# Patient Record
Sex: Female | Born: 1975 | Race: White | Hispanic: No | Smoking: Never smoker
Health system: Southern US, Community
[De-identification: ages and names within clinical notes are randomized; demographics above are authoritative.]

## PROBLEM LIST (undated history)

## (undated) DIAGNOSIS — B019 Varicella without complication: Secondary | ICD-10-CM

## (undated) DIAGNOSIS — F32A Depression, unspecified: Secondary | ICD-10-CM

## (undated) DIAGNOSIS — R51 Headache: Secondary | ICD-10-CM

## (undated) DIAGNOSIS — R519 Headache, unspecified: Secondary | ICD-10-CM

## (undated) DIAGNOSIS — F329 Major depressive disorder, single episode, unspecified: Secondary | ICD-10-CM

## (undated) DIAGNOSIS — G8929 Other chronic pain: Secondary | ICD-10-CM

## (undated) DIAGNOSIS — G4730 Sleep apnea, unspecified: Secondary | ICD-10-CM

## (undated) DIAGNOSIS — M1990 Unspecified osteoarthritis, unspecified site: Secondary | ICD-10-CM

## (undated) DIAGNOSIS — F419 Anxiety disorder, unspecified: Secondary | ICD-10-CM

## (undated) DIAGNOSIS — R351 Nocturia: Secondary | ICD-10-CM

## (undated) DIAGNOSIS — N390 Urinary tract infection, site not specified: Secondary | ICD-10-CM

## (undated) DIAGNOSIS — E079 Disorder of thyroid, unspecified: Secondary | ICD-10-CM

## (undated) DIAGNOSIS — R011 Cardiac murmur, unspecified: Secondary | ICD-10-CM

## (undated) DIAGNOSIS — M542 Cervicalgia: Secondary | ICD-10-CM

## (undated) DIAGNOSIS — F988 Other specified behavioral and emotional disorders with onset usually occurring in childhood and adolescence: Secondary | ICD-10-CM

## (undated) DIAGNOSIS — G4700 Insomnia, unspecified: Secondary | ICD-10-CM

## (undated) HISTORY — DX: Urinary tract infection, site not specified: N39.0

## (undated) HISTORY — PX: OTHER SURGICAL HISTORY: SHX169

## (undated) HISTORY — DX: Disorder of thyroid, unspecified: E07.9

## (undated) HISTORY — DX: Varicella without complication: B01.9

## (undated) HISTORY — DX: Anxiety disorder, unspecified: F41.9

---

## 1997-10-14 ENCOUNTER — Other Ambulatory Visit: Admission: RE | Admit: 1997-10-14 | Discharge: 1997-10-14 | Payer: Self-pay | Admitting: Obstetrics and Gynecology

## 1998-10-26 ENCOUNTER — Other Ambulatory Visit: Admission: RE | Admit: 1998-10-26 | Discharge: 1998-10-26 | Payer: Self-pay | Admitting: Obstetrics and Gynecology

## 2000-12-12 ENCOUNTER — Other Ambulatory Visit: Admission: RE | Admit: 2000-12-12 | Discharge: 2000-12-12 | Payer: Self-pay | Admitting: Obstetrics and Gynecology

## 2001-12-18 ENCOUNTER — Other Ambulatory Visit: Admission: RE | Admit: 2001-12-18 | Discharge: 2001-12-18 | Payer: Self-pay | Admitting: Obstetrics and Gynecology

## 2002-10-22 ENCOUNTER — Encounter: Payer: Self-pay | Admitting: Obstetrics and Gynecology

## 2002-10-22 ENCOUNTER — Ambulatory Visit (HOSPITAL_COMMUNITY): Admission: RE | Admit: 2002-10-22 | Discharge: 2002-10-22 | Payer: Self-pay | Admitting: Obstetrics and Gynecology

## 2002-10-29 ENCOUNTER — Encounter: Payer: Self-pay | Admitting: Obstetrics and Gynecology

## 2002-10-29 ENCOUNTER — Ambulatory Visit (HOSPITAL_COMMUNITY): Admission: RE | Admit: 2002-10-29 | Discharge: 2002-10-29 | Payer: Self-pay | Admitting: Obstetrics and Gynecology

## 2003-01-01 ENCOUNTER — Inpatient Hospital Stay (HOSPITAL_COMMUNITY): Admission: AD | Admit: 2003-01-01 | Discharge: 2003-01-01 | Payer: Self-pay | Admitting: Obstetrics and Gynecology

## 2013-07-03 LAB — HM PAP SMEAR: HM Pap smear: NORMAL

## 2014-05-21 ENCOUNTER — Other Ambulatory Visit (HOSPITAL_COMMUNITY): Payer: Self-pay | Admitting: Orthopaedic Surgery

## 2014-05-27 ENCOUNTER — Encounter (HOSPITAL_COMMUNITY): Payer: Self-pay

## 2014-05-27 ENCOUNTER — Encounter (HOSPITAL_COMMUNITY)
Admission: RE | Admit: 2014-05-27 | Discharge: 2014-05-27 | Disposition: A | Discharge status: Home / Self Care | Payer: Managed Care, Other (non HMO) | Source: Ambulatory Visit | Attending: Orthopaedic Surgery | Admitting: Orthopaedic Surgery

## 2014-05-27 DIAGNOSIS — Z01812 Encounter for preprocedural laboratory examination: Secondary | ICD-10-CM | POA: Diagnosis present

## 2014-05-27 HISTORY — DX: Cervicalgia: M54.2

## 2014-05-27 HISTORY — DX: Other chronic pain: G89.29

## 2014-05-27 HISTORY — DX: Depression, unspecified: F32.A

## 2014-05-27 HISTORY — DX: Other specified behavioral and emotional disorders with onset usually occurring in childhood and adolescence: F98.8

## 2014-05-27 HISTORY — DX: Cardiac murmur, unspecified: R01.1

## 2014-05-27 HISTORY — DX: Sleep apnea, unspecified: G47.30

## 2014-05-27 HISTORY — DX: Nocturia: R35.1

## 2014-05-27 HISTORY — DX: Major depressive disorder, single episode, unspecified: F32.9

## 2014-05-27 HISTORY — DX: Insomnia, unspecified: G47.00

## 2014-05-27 HISTORY — DX: Headache, unspecified: R51.9

## 2014-05-27 HISTORY — DX: Unspecified osteoarthritis, unspecified site: M19.90

## 2014-05-27 HISTORY — DX: Headache: R51

## 2014-05-27 LAB — CBC
HCT: 43.8 % (ref 36.0–46.0)
HEMOGLOBIN: 15.2 g/dL — AB (ref 12.0–15.0)
MCH: 30.5 pg (ref 26.0–34.0)
MCHC: 34.7 g/dL (ref 30.0–36.0)
MCV: 88 fL (ref 78.0–100.0)
Platelets: 324 10*3/uL (ref 150–400)
RBC: 4.98 MIL/uL (ref 3.87–5.11)
RDW: 13.1 % (ref 11.5–15.5)
WBC: 8.2 10*3/uL (ref 4.0–10.5)

## 2014-05-27 LAB — COMPREHENSIVE METABOLIC PANEL
ALT: 19 U/L (ref 0–35)
AST: 16 U/L (ref 0–37)
Albumin: 3.6 g/dL (ref 3.5–5.2)
Alkaline Phosphatase: 92 U/L (ref 39–117)
Anion gap: 12 (ref 5–15)
BILIRUBIN TOTAL: 0.8 mg/dL (ref 0.3–1.2)
BUN: 11 mg/dL (ref 6–23)
CALCIUM: 8.7 mg/dL (ref 8.4–10.5)
CO2: 23 meq/L (ref 19–32)
CREATININE: 0.65 mg/dL (ref 0.50–1.10)
Chloride: 104 mEq/L (ref 96–112)
GFR calc non Af Amer: >90 mL/min (ref >90)
Glucose, Bld: 121 mg/dL — ABNORMAL HIGH (ref 70–99)
Potassium: 4.1 mEq/L (ref 3.7–5.3)
Sodium: 139 mEq/L (ref 137–147)
Total Protein: 7.1 g/dL (ref 6.0–8.3)

## 2014-05-27 LAB — HCG, SERUM, QUALITATIVE: Preg, Serum: NEGATIVE

## 2014-05-27 LAB — SURGICAL PCR SCREEN
MRSA, PCR: NEGATIVE
Staphylococcus aureus: NEGATIVE

## 2014-05-27 MED ORDER — CHLORHEXIDINE GLUCONATE 4 % EX LIQD: 60.0000 mL | Freq: Once | CUTANEOUS | Status: DC

## 2014-05-27 NOTE — Progress Notes (Signed)
Pt doesn't have a cardiologist  Denies ever having an echo/stress test/heart cath  Denies EKG or CXR in past yr   Medical MD is Dr.Mishi Jean RosenthalJackson in [place]

## 2014-05-27 NOTE — Pre-Procedure Instructions (Signed)
[name] Mayotte[facility]athryn [name]  05/27/2014   Your procedure is scheduled on:  Mon, Dec 21 @ 1:10 PM  [facility]  at 11:00 AM.  Call this number if you have problems the morning of surgery: [phone]   Remember:   Do not eat food or drink liquids after midnight.   Take these medicines the morning of surgery with A SIP OF WATER: Methyslphenidate(Concerta)              Stop taking your Aleve. No Goody's,BC's,Aspirin,Ibuprofen,Fish Oil,or any Herbal Medications   Do not wear jewelry, make-up or nail polish.  Do not wear lotions, powders, or perfumes. You may wear deodorant.  Do not shave 48 hours prior to surgery.   Do not bring valuables to the hospital.  [facility] is not responsible                  for any belongings or valuables.               Contacts, dentures or bridgework may not be worn into surgery.  Leave suitcase in the car. After surgery it may be brought to your room.  For patients admitted to the hospital, discharge time is determined by your                treatment team.               Patients discharged the day of surgery will not be allowed to drive  home.    Special Instructions:  [facility] - Preparing for Surgery  Before surgery, you can play an important role.  Because skin is not sterile, your skin needs to be as free of germs as possible.  You can reduce the number of germs on you skin by washing with CHG (chlorahexidine gluconate) soap before surgery.  CHG is an antiseptic cleaner which kills germs and bonds with the skin to continue killing germs even after washing.  Please DO NOT use if you have an allergy to CHG or antibacterial soaps.  If your skin becomes reddened/irritated stop using the CHG and inform your nurse when you arrive at Short Stay.  Do not shave (including legs and underarms) for at least 48 hours prior to the first CHG shower.  You may shave your face.  Please follow these instructions carefully:   1.  Shower with CHG  Soap the night before surgery and the                                morning of Surgery.  2.  If you choose to wash your hair, wash your hair first as usual with your       normal shampoo.  3.  After you shampoo, rinse your hair and body thoroughly to remove the                      Shampoo.  4.  Use CHG as you would any other liquid soap.  You can apply chg directly       to the skin and wash gently with scrungie or a clean washcloth.  5.  Apply the CHG Soap to your body ONLY FROM THE NECK DOWN.        Do not use on open wounds or open sores.  Avoid contact with your eyes,       ears, mouth and genitals (  private parts).  Wash genitals (private parts)       with your normal soap.  6.  Wash thoroughly, paying special attention to the area where your surgery        will be performed.  7.  Thoroughly rinse your body with warm water from the neck down.  8.  DO NOT shower/wash with your normal soap after using and rinsing off       the CHG Soap.  9.  Pat yourself dry with a clean towel.            10.  Wear clean pajamas.            11.  Place clean sheets on your bed the night of your first shower and do not        sleep with pets.  Day of Surgery  Do not apply any lotions/deoderants the morning of surgery.  Please wear clean clothes to the hospital/surgery center.     Please read over the following fact sheets that you were given: Pain Booklet, Coughing and Deep Breathing, MRSA Information and Surgical Site Infection Prevention

## 2014-06-01 MED ORDER — CEFAZOLIN SODIUM-DEXTROSE 2-3 GM-% IV SOLR
2.0000 g | INTRAVENOUS | Status: AC
  Administered 2014-06-02: 2 g via INTRAVENOUS
  Filled 2014-06-01: qty 50

## 2014-06-01 NOTE — Anesthesia Preprocedure Evaluation (Addendum)
Anesthesia Evaluation  Patient identified by MRN, date of birth, ID band Patient awake    Reviewed: Allergy & Precautions, H&P , NPO status , Patient's Chart, lab work & pertinent test results, reviewed documented beta blocker date and time   Airway        Dental   Pulmonary sleep apnea ,          Cardiovascular     Neuro/Psych Depression    GI/Hepatic negative GI ROS, Neg liver ROS,   Endo/Other  negative endocrine ROS  Renal/GU negative Renal ROS     Musculoskeletal   Abdominal   Peds  Hematology negative hematology ROS (+)   Anesthesia Other Findings   Reproductive/Obstetrics                             Anesthesia Physical Anesthesia Plan  ASA: III  Anesthesia Plan: General   Post-op Pain Management:    Induction: Intravenous  Airway Management Planned: Oral ETT  Additional Equipment:   Intra-op Plan:   Post-operative Plan: Extubation in OR  Informed Consent: I have reviewed the patients History and Physical, chart, labs and discussed the procedure including the risks, benefits and alternatives for the proposed anesthesia with the patient or authorized representative who has indicated his/her understanding and acceptance.     Plan Discussed with:   Anesthesia Plan Comments: (Consider multinodal  pain rx)       Anesthesia Quick Evaluation

## 2014-06-02 ENCOUNTER — Ambulatory Visit (HOSPITAL_COMMUNITY): Payer: Managed Care, Other (non HMO) | Admitting: Anesthesiology

## 2014-06-02 ENCOUNTER — Observation Stay (HOSPITAL_COMMUNITY): Payer: Managed Care, Other (non HMO)

## 2014-06-02 ENCOUNTER — Encounter (HOSPITAL_COMMUNITY)
Admission: RE | Disposition: A | Discharge status: Home / Self Care | Payer: Self-pay | Source: Ambulatory Visit | Attending: Orthopaedic Surgery

## 2014-06-02 ENCOUNTER — Observation Stay (HOSPITAL_COMMUNITY)
Admission: RE | Admit: 2014-06-02 | Discharge: 2014-06-03 | Disposition: A | Discharge status: Home / Self Care | Payer: Managed Care, Other (non HMO) | Source: Ambulatory Visit | Attending: Orthopaedic Surgery | Admitting: Orthopaedic Surgery

## 2014-06-02 ENCOUNTER — Encounter (HOSPITAL_COMMUNITY): Payer: Self-pay | Admitting: *Deleted

## 2014-06-02 DIAGNOSIS — M5022 Other cervical disc displacement, mid-cervical region: Principal | ICD-10-CM | POA: Insufficient documentation

## 2014-06-02 DIAGNOSIS — Z419 Encounter for procedure for purposes other than remedying health state, unspecified: Secondary | ICD-10-CM

## 2014-06-02 DIAGNOSIS — Z981 Arthrodesis status: Secondary | ICD-10-CM

## 2014-06-02 HISTORY — PX: ANTERIOR CERVICAL DECOMP/DISCECTOMY FUSION: SHX1161

## 2014-06-02 LAB — URINALYSIS, ROUTINE W REFLEX MICROSCOPIC
BILIRUBIN URINE: NEGATIVE
Glucose, UA: NEGATIVE mg/dL
HGB URINE DIPSTICK: NEGATIVE
KETONES UR: 15 mg/dL — AB
Leukocytes, UA: NEGATIVE
Nitrite: NEGATIVE
PROTEIN: NEGATIVE mg/dL
Specific Gravity, Urine: 1.022 (ref 1.005–1.030)
UROBILINOGEN UA: 0.2 mg/dL (ref 0.0–1.0)
pH: 6 (ref 5.0–8.0)

## 2014-06-02 SURGERY — ANTERIOR CERVICAL DECOMPRESSION/DISCECTOMY FUSION 1 LEVEL
Anesthesia: General | Site: Neck

## 2014-06-02 MED ORDER — LACTATED RINGERS IV SOLN
INTRAVENOUS | Status: DC
  Administered 2014-06-02: 14:00:00 via INTRAVENOUS

## 2014-06-02 MED ORDER — HYDROMORPHONE HCL 1 MG/ML IJ SOLN
0.5000 mg | INTRAMUSCULAR | Status: DC | PRN
  Administered 2014-06-03: 1 mg via INTRAVENOUS
  Filled 2014-06-02: qty 1

## 2014-06-02 MED ORDER — EPHEDRINE SULFATE 50 MG/ML IJ SOLN
INTRAMUSCULAR | Status: DC | PRN
  Administered 2014-06-02: 5 mg via INTRAVENOUS

## 2014-06-02 MED ORDER — METOCLOPRAMIDE HCL 10 MG PO TABS: 5.0000 mg | ORAL_TABLET | Freq: Three times a day (TID) | ORAL | Status: DC | PRN

## 2014-06-02 MED ORDER — METHOCARBAMOL 500 MG PO TABS
500.0000 mg | ORAL_TABLET | Freq: Four times a day (QID) | ORAL | Status: DC | PRN
  Administered 2014-06-02 – 2014-06-03 (×2): 500 mg via ORAL
  Filled 2014-06-02 (×2): qty 1

## 2014-06-02 MED ORDER — THROMBIN 20000 UNITS EX SOLR
OROMUCOSAL | Status: DC | PRN
  Administered 2014-06-02: 20000 mL via TOPICAL

## 2014-06-02 MED ORDER — LACTATED RINGERS IV SOLN
INTRAVENOUS | Status: DC | PRN
  Administered 2014-06-02: 15:00:00 via INTRAVENOUS

## 2014-06-02 MED ORDER — METOCLOPRAMIDE HCL 5 MG/ML IJ SOLN: 5.0000 mg | Freq: Three times a day (TID) | INTRAMUSCULAR | Status: DC | PRN

## 2014-06-02 MED ORDER — PROPOFOL 10 MG/ML IV BOLUS
INTRAVENOUS | Status: AC
  Filled 2014-06-02: qty 20

## 2014-06-02 MED ORDER — VANCOMYCIN HCL IN DEXTROSE 1-5 GM/200ML-% IV SOLN
1000.0000 mg | Freq: Two times a day (BID) | INTRAVENOUS | Status: AC
  Administered 2014-06-03: 1000 mg via INTRAVENOUS
  Filled 2014-06-02: qty 200

## 2014-06-02 MED ORDER — ONDANSETRON HCL 4 MG PO TABS: 4.0000 mg | ORAL_TABLET | Freq: Four times a day (QID) | ORAL | Status: DC | PRN

## 2014-06-02 MED ORDER — MIDAZOLAM HCL 2 MG/2ML IJ SOLN
INTRAMUSCULAR | Status: AC
  Filled 2014-06-02: qty 2

## 2014-06-02 MED ORDER — ONDANSETRON HCL 4 MG/2ML IJ SOLN
INTRAMUSCULAR | Status: DC | PRN
Start: 2014-06-02 — End: 2014-06-02
  Administered 2014-06-02: 4 mg via INTRAVENOUS

## 2014-06-02 MED ORDER — PROMETHAZINE HCL 25 MG/ML IJ SOLN
INTRAMUSCULAR | Status: AC
  Filled 2014-06-02: qty 1

## 2014-06-02 MED ORDER — METHOCARBAMOL 500 MG PO TABS: 500.0000 mg | ORAL_TABLET | Freq: Four times a day (QID) | ORAL | Status: DC

## 2014-06-02 MED ORDER — NEOSTIGMINE METHYLSULFATE 10 MG/10ML IV SOLN
INTRAVENOUS | Status: DC | PRN
  Administered 2014-06-02: 5 mg via INTRAVENOUS

## 2014-06-02 MED ORDER — FENTANYL CITRATE 0.05 MG/ML IJ SOLN
INTRAMUSCULAR | Status: AC
  Filled 2014-06-02: qty 5

## 2014-06-02 MED ORDER — BUPIVACAINE-EPINEPHRINE 0.5% -1:200000 IJ SOLN
INTRAMUSCULAR | Status: DC | PRN
  Administered 2014-06-02: 6 mL

## 2014-06-02 MED ORDER — FENTANYL CITRATE 0.05 MG/ML IJ SOLN
INTRAMUSCULAR | Status: DC | PRN
  Administered 2014-06-02 (×5): 50 ug via INTRAVENOUS

## 2014-06-02 MED ORDER — OXYCODONE-ACETAMINOPHEN 5-325 MG PO TABS
1.0000 | ORAL_TABLET | Freq: Four times a day (QID) | ORAL | Status: DC | PRN
  Administered 2014-06-02 – 2014-06-03 (×2): 1 via ORAL
  Filled 2014-06-02 (×2): qty 1

## 2014-06-02 MED ORDER — POTASSIUM CHLORIDE IN NACL 20-0.45 MEQ/L-% IV SOLN
INTRAVENOUS | Status: DC
  Administered 2014-06-03: 01:00:00 via INTRAVENOUS
  Filled 2014-06-02 (×3): qty 1000

## 2014-06-02 MED ORDER — GLYCOPYRROLATE 0.2 MG/ML IJ SOLN
INTRAMUSCULAR | Status: DC | PRN
  Administered 2014-06-02: .8 mg via INTRAVENOUS

## 2014-06-02 MED ORDER — ONDANSETRON HCL 4 MG/2ML IJ SOLN
INTRAMUSCULAR | Status: AC
  Filled 2014-06-02: qty 2

## 2014-06-02 MED ORDER — HYDROMORPHONE HCL 1 MG/ML IJ SOLN
INTRAMUSCULAR | Status: AC
  Filled 2014-06-02: qty 1

## 2014-06-02 MED ORDER — THROMBIN 20000 UNITS EX SOLR
CUTANEOUS | Status: AC
  Filled 2014-06-02: qty 20000

## 2014-06-02 MED ORDER — 0.9 % SODIUM CHLORIDE (POUR BTL) OPTIME
TOPICAL | Status: DC | PRN
  Administered 2014-06-02: 1000 mL

## 2014-06-02 MED ORDER — PROMETHAZINE HCL 25 MG/ML IJ SOLN
6.2500 mg | INTRAMUSCULAR | Status: DC | PRN
  Administered 2014-06-02: 6.25 mg via INTRAVENOUS

## 2014-06-02 MED ORDER — LIDOCAINE HCL (CARDIAC) 20 MG/ML IV SOLN
INTRAVENOUS | Status: AC
  Filled 2014-06-02: qty 5

## 2014-06-02 MED ORDER — METHOCARBAMOL 1000 MG/10ML IJ SOLN
500.0000 mg | Freq: Four times a day (QID) | INTRAVENOUS | Status: DC | PRN
  Filled 2014-06-02: qty 5

## 2014-06-02 MED ORDER — OXYCODONE HCL 5 MG/5ML PO SOLN: 5.0000 mg | Freq: Once | ORAL | Status: DC | PRN

## 2014-06-02 MED ORDER — OXYCODONE-ACETAMINOPHEN 10-325 MG PO TABS: 1.0000 | ORAL_TABLET | Freq: Four times a day (QID) | ORAL | Status: DC | PRN

## 2014-06-02 MED ORDER — OXYCODONE HCL 5 MG PO TABS: 5.0000 mg | ORAL_TABLET | Freq: Four times a day (QID) | ORAL | Status: DC | PRN

## 2014-06-02 MED ORDER — OXYCODONE HCL 5 MG PO TABS: 5.0000 mg | ORAL_TABLET | Freq: Once | ORAL | Status: DC | PRN

## 2014-06-02 MED ORDER — ROCURONIUM BROMIDE 100 MG/10ML IV SOLN
INTRAVENOUS | Status: DC | PRN
  Administered 2014-06-02: 50 mg via INTRAVENOUS

## 2014-06-02 MED ORDER — ONDANSETRON HCL 4 MG/2ML IJ SOLN: 4.0000 mg | Freq: Four times a day (QID) | INTRAMUSCULAR | Status: DC | PRN

## 2014-06-02 MED ORDER — LIDOCAINE HCL (CARDIAC) 20 MG/ML IV SOLN
INTRAVENOUS | Status: DC | PRN
  Administered 2014-06-02: 100 mg via INTRAVENOUS

## 2014-06-02 MED ORDER — MIDAZOLAM HCL 5 MG/5ML IJ SOLN
INTRAMUSCULAR | Status: DC | PRN
  Administered 2014-06-02: 2 mg via INTRAVENOUS

## 2014-06-02 MED ORDER — METHYLPHENIDATE HCL ER 18 MG PO TB24: 36.0000 mg | ORAL_TABLET | Freq: Every day | ORAL | Status: DC

## 2014-06-02 MED ORDER — HYDROMORPHONE HCL 1 MG/ML IJ SOLN
0.2500 mg | INTRAMUSCULAR | Status: DC | PRN
  Administered 2014-06-02 (×2): 0.5 mg via INTRAVENOUS

## 2014-06-02 MED ORDER — PROPOFOL 10 MG/ML IV BOLUS
INTRAVENOUS | Status: DC | PRN
  Administered 2014-06-02: 50 mg via INTRAVENOUS
  Administered 2014-06-02: 150 mg via INTRAVENOUS

## 2014-06-02 MED ORDER — ROCURONIUM BROMIDE 50 MG/5ML IV SOLN
INTRAVENOUS | Status: AC
  Filled 2014-06-02: qty 1

## 2014-06-02 SURGICAL SUPPLY — 63 items
ADH SKN CLS APL DERMABOND .7 (GAUZE/BANDAGES/DRESSINGS) ×1
APL SKNCLS STERI-STRIP NONHPOA (GAUZE/BANDAGES/DRESSINGS) ×1
BENZOIN TINCTURE PRP APPL 2/3 (GAUZE/BANDAGES/DRESSINGS) ×2 IMPLANT
BIT DRILL SKYLINE 12MM (BIT) IMPLANT
BLADE SURG ROTATE 9660 (MISCELLANEOUS) IMPLANT
BONE CERV LORDOTIC 14.5X12X6 (Bone Implant) ×3 IMPLANT
BUR ROUND FLUTED 4 SOFT TCH (BURR) IMPLANT
BUR ROUND FLUTED 4MM SOFT TCH (BURR)
CLOSURE STERI-STRIP 1/2X4 (GAUZE/BANDAGES/DRESSINGS) ×1
CLSR STERI-STRIP ANTIMIC 1/2X4 (GAUZE/BANDAGES/DRESSINGS) ×1 IMPLANT
COLLAR CERV LO CONTOUR FIRM DE (SOFTGOODS) ×3 IMPLANT
CORDS BIPOLAR (ELECTRODE) IMPLANT
COVER MAYO STAND STRL (DRAPES) ×3 IMPLANT
COVER SURGICAL LIGHT HANDLE (MISCELLANEOUS) ×3 IMPLANT
CRADLE DONUT ADULT HEAD (MISCELLANEOUS) ×3 IMPLANT
DERMABOND ADVANCED (GAUZE/BANDAGES/DRESSINGS) ×2
DERMABOND ADVANCED .7 DNX12 (GAUZE/BANDAGES/DRESSINGS) ×1 IMPLANT
DRAPE C-ARM 42X72 X-RAY (DRAPES) ×3 IMPLANT
DRAPE MICROSCOPE LEICA (MISCELLANEOUS) ×3 IMPLANT
DRAPE PROXIMA HALF (DRAPES) ×3 IMPLANT
DRILL BIT SKYLINE 12MM (BIT) ×3
DRSG MEPILEX BORDER 4X4 (GAUZE/BANDAGES/DRESSINGS) ×3 IMPLANT
DRSG MEPILEX BORDER 4X8 (GAUZE/BANDAGES/DRESSINGS) ×3 IMPLANT
DURAPREP 6ML APPLICATOR 50/CS (WOUND CARE) ×3 IMPLANT
ELECT COATED BLADE 2.86 ST (ELECTRODE) ×3 IMPLANT
ELECT REM PT RETURN 9FT ADLT (ELECTROSURGICAL) ×3
ELECTRODE REM PT RTRN 9FT ADLT (ELECTROSURGICAL) ×1 IMPLANT
EVACUATOR 1/8 PVC DRAIN (DRAIN) ×3 IMPLANT
GAUZE SPONGE 4X4 12PLY STRL (GAUZE/BANDAGES/DRESSINGS) ×3 IMPLANT
GAUZE XEROFORM 1X8 LF (GAUZE/BANDAGES/DRESSINGS) ×6 IMPLANT
GLOVE BIOGEL PI IND STRL 7.5 (GLOVE) ×1 IMPLANT
GLOVE BIOGEL PI IND STRL 8 (GLOVE) ×1 IMPLANT
GLOVE BIOGEL PI INDICATOR 7.5 (GLOVE) ×2
GLOVE BIOGEL PI INDICATOR 8 (GLOVE) ×2
GLOVE ECLIPSE 7.0 STRL STRAW (GLOVE) ×3 IMPLANT
GLOVE ORTHO TXT STRL SZ7.5 (GLOVE) ×3 IMPLANT
GOWN STRL REUS W/ TWL LRG LVL3 (GOWN DISPOSABLE) ×2 IMPLANT
GOWN STRL REUS W/ TWL XL LVL3 (GOWN DISPOSABLE) ×1 IMPLANT
GOWN STRL REUS W/TWL LRG LVL3 (GOWN DISPOSABLE) ×6
GOWN STRL REUS W/TWL XL LVL3 (GOWN DISPOSABLE) ×3
GRAFT BNE SPCR VG2 14.5X12X6 (Bone Implant) IMPLANT
HEAD HALTER (SOFTGOODS) ×3 IMPLANT
HEMOSTAT SURGICEL 2X14 (HEMOSTASIS) IMPLANT
KIT BASIN OR (CUSTOM PROCEDURE TRAY) ×3 IMPLANT
KIT ROOM TURNOVER OR (KITS) ×3 IMPLANT
MANIFOLD NEPTUNE II (INSTRUMENTS) ×3 IMPLANT
NDL 25GX 5/8IN NON SAFETY (NEEDLE) ×1 IMPLANT
NEEDLE 25GX 5/8IN NON SAFETY (NEEDLE) ×3 IMPLANT
NS IRRIG 1000ML POUR BTL (IV SOLUTION) ×3 IMPLANT
PACK ORTHO CERVICAL (CUSTOM PROCEDURE TRAY) ×3 IMPLANT
PAD ARMBOARD 7.5X6 YLW CONV (MISCELLANEOUS) ×6 IMPLANT
PATTIES SURGICAL .5 X.5 (GAUZE/BANDAGES/DRESSINGS) IMPLANT
PLATE ONE LEVEL SKYLINE 14MM (Plate) ×2 IMPLANT
SCREW VAR SELF TAP SKYLINE 14M (Screw) ×8 IMPLANT
SURGIFLO TRUKIT (HEMOSTASIS) IMPLANT
SUT BONE WAX W31G (SUTURE) ×3 IMPLANT
SUT VIC AB 3-0 X1 27 (SUTURE) ×3 IMPLANT
SUT VICRYL 4-0 PS2 18IN ABS (SUTURE) ×6 IMPLANT
SYR 30ML SLIP (SYRINGE) ×3 IMPLANT
SYR BULB 3OZ (MISCELLANEOUS) ×3 IMPLANT
TOWEL OR 17X24 6PK STRL BLUE (TOWEL DISPOSABLE) ×3 IMPLANT
TOWEL OR 17X26 10 PK STRL BLUE (TOWEL DISPOSABLE) ×3 IMPLANT
WATER STERILE IRR 1000ML POUR (IV SOLUTION) ×3 IMPLANT

## 2014-06-02 NOTE — H&P (Signed)
  [facility]   A Division of [facility]   [address], [place], [place] [address] Telephone: [phone]  Fax: [phone]     PATIENT: [name] BostonMoffitt, [name]   MR#: [id number]  DOB: 12/06/1962   Visit Date: 04/01/2014     Followup fasciotomy tibial nail comminuted tib-fib.  She has some healing and it is not consolidated.  She is still having pain at the interlock screws and somewhat at the fracture but has noticed some decrease in pain from month-to-month.   PLAN:  We discussed and reviewed the x-rays which shows some interval consolidation but there is still anterior gap.  She may require exchange nailing with reaming and grafting intramedullary canal.  We will check her back in a month.  Temporary handicap given for 6 months parking.   For additional information please see handwritten notes, reports, orders and prescriptions in this chart.      Ebubechukwu Jedlicka C. Ophelia CharterYates, M.D.    Auto-Authenticated by Veverly FellsMark C. Ophelia CharterYates, M.D.  MCY/dr DD: 04/01/2014  DT: 04/02/2014    [facility]   A Division of [facility]   [address], [place], [place] [address] Telephone: [phone]  Fax: [phone]     PATIENT: [name] MorganWestmoreland, [name]   MR#: [id number]  DOB: 12/01/1975   Visit Date: 01/29/2014     The patient is seen with re-established patient with cervical spine pain.  She denies any injury.  She has had pain for years.  It has been much worse in the last 2 months.  She denies any arm pain or neck pain at times, it is moderately severe.  The pain shoots sometimes posteriorly up her neck in the occipital region.     Medications are reviewed and are unchanged.  See [facility] History Sheet.  Previous C-sections.  She works as a Associate Professorpharmacy tech.   REVIEW OF SYSTEMS:   Positive for anxiety, depression, sleep apnea and a thyroid condition.   PHYSICAL EXAMINATION:  The patient is 4 feet 11 inches, 200 pounds.  Upper  and lower extremity reflexes are 2+ and symmetric.  No isolated motor weakness.  No impingement in the shoulders.  Negative drop arm test.  There is trapezial tenderness.     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     PLAN:  We will set her up for continued conservative treatment.  If she gets increased symptoms and wants to return to therapy, she can call and let us know; otherwise office followup p.r.n.    For additional information please see handwritten notes, reports, orders and prescriptions in this chart.      Roseanna Koplin C. Ophelia CharterYates, M.D.    Auto-Authenticated by Veverly FellsMark C. Ophelia CharterYates, M.D.  MCY/cd DD: 02/02/2014  DT: 02/04/2014

## 2014-06-02 NOTE — Interval H&P Note (Signed)
History and Physical Interval Note:  06/02/2014 2:30 PM  Trey SailorsKathryn L [name]  has presented today for surgery, with the diagnosis of C6-7 HNP  The various methods of treatment have been discussed with the patient and family. After consideration of risks, benefits and other options for treatment, the patient has consented to  Procedure(s): C6-7 Anterior Cervical Discectomy and Fusion, Allograft, Plate (N/A) as a surgical intervention .  The patient's history has been reviewed, patient examined, no change in status, stable for surgery.  I have reviewed the patient's chart and labs.  Questions were answered to the patient's satisfaction.     Caytlin Better C

## 2014-06-02 NOTE — Transfer of Care (Signed)
Immediate Anesthesia Transfer of Care Note  Patient: [name] SailorsKathryn L [name]  Procedure(s) Performed: Procedure(s): C6-7 Anterior Cervical Discectomy and Fusion, Allograft, Plate (N/A)  Patient Location: PACU  Anesthesia Type:General  Level of Consciousness: awake, alert  and oriented  Airway & Oxygen Therapy: Patient Spontanous Breathing  Post-op Assessment: Report given to PACU RN  Post vital signs: Reviewed and stable  Complications: No apparent anesthesia complications

## 2014-06-02 NOTE — Anesthesia Postprocedure Evaluation (Signed)
  Anesthesia Post-op Note  Patient: [name]  Procedure(s) Performed: Procedure(s): C6-7 Anterior Cervical Discectomy and Fusion, Allograft, Plate (N/A)  Patient Location: PACU  Anesthesia Type:General  Level of Consciousness: awake and alert   Airway and Oxygen Therapy: Patient Spontanous Breathing  Post-op Pain: mild  Post-op Assessment: Post-op Vital signs reviewed  Post-op Vital Signs: Reviewed  Last Vitals:  Filed Vitals:   06/02/14 1930  BP:   Pulse: 90  Temp:   Resp: 14    Complications: No apparent anesthesia complications

## 2014-06-02 NOTE — Progress Notes (Signed)
Placed patient on CPAP for the night. CPAP set at 8 cmH2O. Pt is wearing home nasal mask and tolerating well at this time.

## 2014-06-02 NOTE — Interval H&P Note (Signed)
History and Physical Interval Note:  06/02/2014 2:29 PM  [name] SailorsKathryn L [name]  has presented today for surgery, with the diagnosis of C6-7 HNP  The various methods of treatment have been discussed with the patient and family. After consideration of risks, benefits and other options for treatment, the patient has consented to  Procedure(s): C6-7 Anterior Cervical Discectomy and Fusion, Allograft, Plate (N/A) as a surgical intervention .  The patient's history has been reviewed, patient examined, no change in status, stable for surgery.  I have reviewed the patient's chart and labs.  Questions were answered to the patient's satisfaction.     Eilah Common C

## 2014-06-02 NOTE — Brief Op Note (Signed)
06/02/2014  5:44 PM  PATIENT:  [name]  38 y.o. female  PRE-OPERATIVE DIAGNOSIS:  C6-7 HNP  POST-OPERATIVE DIAGNOSIS:  C6-7 HNP  PROCEDURE:  Procedure(s): C6-7 Anterior Cervical Discectomy and Fusion, Allograft, Plate (N/A)  SURGEON:  Surgeon(s) and Role:    * Eldred MangesMark C Harlen Danford, MD - Primary  PHYSICIAN ASSISTANT: Zonia KiefJames Owens PA-C  ASSISTANTS: none   ANESTHESIA:   local and general  EBL:  Total I/O In: -  Out: 150 [Blood:150]  BLOOD ADMINISTERED:none  DRAINS: HV drain neck  LOCAL MEDICATIONS USED:  MARCAINE     SPECIMEN:  No Specimen  DISPOSITION OF SPECIMEN:  N/A  COUNTS:  YES  TOURNIQUET:  * No tourniquets in log *  DICTATION: .Other Dictation: Dictation Number [id number]  PLAN OF CARE: Admit for overnight observation  PATIENT DISPOSITION:  PACU - hemodynamically stable.   Delay start of Pharmacological VTE agent (>24hrs) due to surgical blood loss or risk of bleeding: not applicable

## 2014-06-03 ENCOUNTER — Encounter (HOSPITAL_COMMUNITY): Payer: Self-pay | Admitting: Orthopaedic Surgery

## 2014-06-03 DIAGNOSIS — M5022 Other cervical disc displacement, mid-cervical region: Secondary | ICD-10-CM | POA: Diagnosis not present

## 2014-06-03 NOTE — Op Note (Signed)
NAMMarland Kitchen:  [name], [name]        ACCOUNT NO.:  [id number]  MEDICAL RECORD NO.:  [id number]  LOCATION:  5N01C                        FACILITY:  [facility]  PHYSICIAN:  Deland Slocumb C. Ophelia CharterYates, M.D.    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  PROCEDURE:  C6-7 anterior cervical diskectomy and fusion, allograft and plate.  SURGEON:  Shalayna Ornstein C. Ophelia CharterYates, M.D.  ASSISTANT:  Genene ChurnJames M. Barry Dieneswens, PA-C, medically necessary and present for the entire.  ANESTHESIA:  Marcaine local plus general.  EBL:  150 mL.  DRAINS:  One Hemovac neck.  COMPLICATIONS:  None.  DESCRIPTION OF PROCEDURE:  After induction of general anesthesia, Ancef prophylaxis, standard prepping and draping, neck was squared with towels, sterile skin marker was used, Betadine Steri-Drape applied, sterile Mayo stand at the head, thyroid sheet drapes were applied.  Time- out procedure completed.  Incision was made at the midline extending to the left, overlying C6-7 level based on palpable landmarks.  Platysma was split in line with the fibers.  Blunt dissection down from the field, which was starting at the midline extending to the left with carotid sheath and contents lateral, and trachea and esophagus toward the midline.  Spurs at C6-7 was noted.  Self-retaining Cloward retractors were placed, 25 short needle placed in the disk space, straight clamp confirmed with lateral C-arm.  Wrist restraints had been applied, pulled down had to be applied in order to visualize this due to the patient's short neck and increased BMI.  Oblique images confirmed appropriate level.  Self-retaining retractors were placed, teeth blades right and left underneath the longus coli, smooth blades up and  down. Diskectomy was performed.  Spur was removed anteriorly.  Operative microscope was draped and brought in.  Posterior aspect of the disk was decompressed and there was disk herniation still retained by the ligament, chunks of disks.  They were adherent to the dura and there was sac with some epidural veins present.  Microscopic dissection was performed peeling in off the dura until there was good decompression. Dura was visualized.  Lateral vein on the right side was coagulated. Thrombin Gelfoam was placed where the disk fragment had caused some oozing.  Uncovertebral joints were stripped.  A 6-mm graft gave nice tight fit and cortical cancellous allograft was marked anteriorly in the midline with marker and then the graft was inserted with CRNA pulling on the neck, countersunk 2 mm.  Graft was tight.  A 14-mm plate was selected, DePuy.  12-mm screws x4.  C-arm was confirmed, good position and alignment and then the final screws were locked in.  After irrigation with saline solution, Hemovac was placed with in-and-out technique.  Platysma was closed with 3-0.  The operative field was dry. 4-0 Vicryl and subcuticular skin closure.  Tincture of benzoin, Steri- Strips, Marcaine infiltration, 4x4s, tape, and soft cervical collar. Instrument count and needle count were correct.     [name], M.D.     MCY/MEDQ  D:  06/02/2014  T:  06/03/2014  Job:  [id number]

## 2014-06-03 NOTE — Progress Notes (Signed)
Orthopedic Tech Progress Note Patient Details:  [name] SailorsKathryn L [name] 08/18/1975 [id number]  Ortho Devices Type of Ortho Device: Soft collar Ortho Device/Splint Location: neck Ortho Device/Splint Interventions: Application   [name] 06/03/2014, 8:17 AM

## 2014-06-03 NOTE — Evaluation (Signed)
Physical Therapy Evaluation Patient Details Name: [name] MRN: [id number] DOB: 03/07/1976 Today's Date: 06/03/2014   History of Present Illness  [name] has presented today for surgery, with the diagnosis of C6-7 HNP Pt s/p C6-7 Anterior Cervical Discectomy and Fusion, Allograft, Plate (N/A) as a surgical intervention 12/21 .  Clinical Impression  Pt admitted with above. Pt with good return verbal understanding of precautions and demo's safe mod I/supervision level of mobility. Pt with no further acute PT needs at this time. PT signing off.    Follow Up Recommendations No PT follow up;Supervision - Intermittent    Equipment Recommendations  None recommended by PT    Recommendations for Other Services       Precautions / Restrictions Precautions Precautions: Cervical Precaution Comments: hand out and education provided Required Braces or Orthoses: Cervical Brace Cervical Brace: Soft collar;At all times Restrictions Weight Bearing Restrictions: No      Mobility  Bed Mobility Overal bed mobility: Modified Independent             General bed mobility comments: v/cs for log roll technique, transferred in/out of bed  Transfers Overall transfer level: Modified independent Equipment used: None             General transfer comment: used bilat UE to push up, slow/guarded  Ambulation/Gait Ambulation/Gait assistance: Supervision Ambulation Distance (Feet): 150 Feet Assistive device: None Gait Pattern/deviations: WFL(Within Functional Limits)     General Gait Details: guarded, cautious but no episodes of LOB  Stairs Stairs: Yes Stairs assistance: Supervision Stair Management: One rail Right Number of Stairs: 5 General stair comments: instructed pt to not pull up with R UE to only use railing for balance  Wheelchair Mobility    Modified Rankin (Stroke Patients Only)       Balance Overall balance assessment: No apparent  balance deficits (not formally assessed)                                           Pertinent Vitals/Pain Pain Assessment: 0-10 Pain Score: 9  Pain Location: surgical site Pain Descriptors / Indicators: Discomfort Pain Intervention(s): RN gave pain meds during session    Home Living Family/patient expects to be discharged to:: Private residence Living Arrangements: Spouse/significant other;Children Available Help at Discharge: Family;Available 24 hours/day Type of Home: House Home Access: Stairs to enter Entrance Stairs-Rails: Right Entrance Stairs-Number of Steps: 5 Home Layout: One level Home Equipment: None      Prior Function Level of Independence: Independent               Hand Dominance   Dominant Hand: Right    Extremity/Trunk Assessment   Upper Extremity Assessment:  (WFL within precautions)           Lower Extremity Assessment: Overall WFL for tasks assessed      Cervical / Trunk Assessment:  (recent surgery)  Communication   Communication: No difficulties  Cognition Arousal/Alertness: Awake/alert Behavior During Therapy: WFL for tasks assessed/performed Overall Cognitive Status: Within Functional Limits for tasks assessed                      General Comments      Exercises        Assessment/Plan    PT Assessment Patent does not need any further PT services  PT Diagnosis Acute pain   PT Problem  List    PT Treatment Interventions     PT Goals (Current goals can be found in the Care Plan section) Acute Rehab PT Goals Patient Stated Goal: home PT Goal Formulation: All assessment and education complete, DC therapy    Frequency     Barriers to discharge        Co-evaluation               End of Session   Activity Tolerance: Patient tolerated treatment well Patient left: in bed;with call bell/phone within reach;with family/visitor present Nurse Communication: Mobility status    Functional  Assessment Tool Used: clinical judgment Functional Limitation: Mobility: Walking and moving around Mobility: Walking and Moving Around Current Status ([phone]): At least 1 percent but less than 20 percent impaired, limited or restricted Mobility: Walking and Moving Around Goal Status [phone]): At least 1 percent but less than 20 percent impaired, limited or restricted Mobility: Walking and Moving Around Discharge Status [phone]): At least 1 percent but less than 20 percent impaired, limited or restricted    Time: 0720-0745 PT Time Calculation (min) (ACUTE ONLY): 25 min   Charges:   PT Evaluation $Initial PT Evaluation Tier I: 1 Procedure PT Treatments $Gait Training: 8-22 mins   PT G Codes:   Functional Assessment Tool Used: clinical judgment Functional Limitation: Mobility: Walking and moving around    Frederickhadwell, Becky Saxshly Marie 06/03/2014, 8:34 AM   Lewis ShockAshly Chelsey Kimberley, PT, DPT Pager #: [phone] Office #: [phone]

## 2014-06-03 NOTE — Progress Notes (Signed)
Orthopedic Tech Progress Note Patient Details:  [name] 01/06/1976 [id number]  Ortho Devices Type of Ortho Device: Soft collar Ortho Device/Splint Location: neck Ortho Device/Splint Interventions: Application   [name] 06/03/2014, 8:17 AM  

## 2014-06-03 NOTE — Progress Notes (Signed)
Orthopedic Tech Progress Note Patient Details:  Trey SailorsKathryn L [name] 01/16/1976 [id number]  Ortho Devices Type of Ortho Device: Crutches Ortho Device/Splint Interventions: Application   [name] 06/03/2014, 8:14 AM

## 2014-06-03 NOTE — Discharge Instructions (Signed)
No lifting more than 20 lbs. Keep collar on except when switching to shower collar.

## 2014-06-03 NOTE — Progress Notes (Signed)
Patient provided with discharge instructions and follow up information. Extra soft cervical collar provided for showers and patient educated on signs/symtoms of infection. Going home with her husband at this time.

## 2014-06-30 NOTE — Discharge Summary (Signed)
Patient ID: [name] SailorsKathryn L Westmoreland MRN: [id number] DOB/AGE: 37/10/1975 37 y.o.  Admit date: 06/02/2014 Discharge date: 06/30/2014  Admission Diagnoses:  Active Problems:   S/P cervical spinal fusion   Discharge Diagnoses:  Active Problems:   S/P cervical spinal fusion  status post Procedure(s): C6-7 Anterior Cervical Discectomy and Fusion, Allograft, Plate  Past Medical History  Diagnosis Date  . Headache     almost every day  . Heart murmur     as a baby  . Chronic neck pain     HNP  . Arthritis   . Nocturia   . ADD (attention deficit disorder)     takes COncerta daily  . Depression     doesn't take any meds  . Sleep apnea     CPAP-SLEEP STUDY DONE ABOUT 2 YRS AGO  . Insomnia     BUT DOESN'T TAKE ANY MEDS    Surgeries: Procedure(s): C6-7 Anterior Cervical Discectomy and Fusion, Allograft, Plate on 65/78/469612/21/2015   Consultants:    Discharged Condition: Improved  Hospital Course: [name] SailorsKathryn L Westmoreland is an 39 y.o. female who was admitted 06/02/2014 for operative treatment of CERVICAL ddd/HNP. Patient failed conservative treatments (please see the history and physical for the specifics) and had severe unremitting pain that affects sleep, daily activities and work/hobbies. After pre-op clearance, the patient was taken to the operating room on 06/02/2014 and underwent  Procedure(s): C6-7 Anterior Cervical Discectomy and Fusion, Allograft, Plate.    Patient was given perioperative antibiotics:  Anti-infectives    Start     Dose/Rate Route Frequency Ordered Stop   06/03/14 0000  vancomycin (VANCOCIN) IVPB 1000 mg/200 mL premix     1,000 mg200 mL/hr over 60 Minutes Intravenous Every 12 hours 06/02/14 2019 06/03/14 0137   06/02/14 0600  ceFAZolin (ANCEF) IVPB 2 g/50 mL premix     2 g100 mL/hr over 30 Minutes Intravenous On call to O.R. 06/01/14 1314 06/02/14 1604       Patient was given sequential compression devices and early ambulation to prevent DVT.   Patient  benefited maximally from hospital stay and there were no complications. At the time of discharge, the patient was urinating/moving their bowels without difficulty, tolerating a regular diet, pain is controlled with oral pain medications and they have been cleared by PT/OT.   Recent vital signs: No data found.    Recent laboratory studies: No results for input(s): WBC, HGB, HCT, PLT, NA, K, CL, CO2, BUN, CREATININE, GLUCOSE, INR, CALCIUM in the last 72 hours.  Invalid input(s): PT, 2   Discharge Medications:     Medication List    STOP taking these medications        naproxen sodium 220 MG tablet  Commonly known as:  ANAPROX      TAKE these medications        GILDESS FE 1.5/30 1.5-30 MG-MCG tablet  Generic drug:  norethindrone-ethinyl estradiol-iron  Take 1 tablet by mouth daily.     methocarbamol 500 MG tablet  Commonly known as:  ROBAXIN  Take 1 tablet (500 mg total) by mouth 4 (four) times daily.     methylphenidate 36 MG CR tablet  Commonly known as:  CONCERTA  Take 36 mg by mouth daily.     oxyCODONE-acetaminophen 10-325 MG per tablet  Commonly known as:  PERCOCET  Take 1 tablet by mouth every 6 (six) hours as needed.        Diagnostic Studies: Dg Cervical Spine 2-3 Views  06/02/2014   CLINICAL  DATA:  C6-7 ACDF.  EXAM: DG C-ARM 61-120 MIN; CERVICAL SPINE - 2-3 VIEW  COMPARISON:  Cervical MRI 04/20/2014.  FLUOROSCOPY TIME:  C-arm fluoroscopic images were obtained intraoperatively and submitted for post operative interpretation. Please see the performing provider's procedural report for the fluoroscopy time utilized.  FINDINGS: Three spot fluoroscopic images demonstrate anterior discectomy and fusion C6-7 with an anterior plate and screws. Intervertebral bone plug is not well visualized. No complications are identified. Patient is intubated.  IMPRESSION: Intraoperative views following C6-7 ACDF. No demonstrated complication.   Electronically Signed   By: Roxy Horseman M.D.    On: 06/02/2014 17:53   Dg C-arm 1-60 Min  06/02/2014   CLINICAL DATA:  C6-7 ACDF.  EXAM: DG C-ARM 61-120 MIN; CERVICAL SPINE - 2-3 VIEW  COMPARISON:  Cervical MRI 04/20/2014.  FLUOROSCOPY TIME:  C-arm fluoroscopic images were obtained intraoperatively and submitted for post operative interpretation. Please see the performing provider's procedural report for the fluoroscopy time utilized.  FINDINGS: Three spot fluoroscopic images demonstrate anterior discectomy and fusion C6-7 with an anterior plate and screws. Intervertebral bone plug is not well visualized. No complications are identified. Patient is intubated.  IMPRESSION: Intraoperative views following C6-7 ACDF. No demonstrated complication.   Electronically Signed   By: Roxy Horseman M.D.   On: 06/02/2014 17:53        Discharge Instructions    Call MD / Call 911    Complete by:  As directed   If you experience chest pain or shortness of breath, CALL 911 and be transported to the hospital emergency room.  If you develope a fever above 101 F, pus (white drainage) or increased drainage or redness at the wound, or calf pain, call your surgeon's office.     Constipation Prevention    Complete by:  As directed   Drink plenty of fluids.  Prune juice may be helpful.  You may use a stool softener, such as Colace (over the counter) 100 mg twice a day.  Use MiraLax (over the counter) for constipation as needed.     Diet - low sodium heart healthy    Complete by:  As directed      Discharge instructions    Complete by:  As directed   Ok to shower 5 days postop.  Do not apply any creams or ointments to incision.  Do not remove steri-strips.  Can use 4x4 gauze and tape for dressing changes.  No aggressive activity. Cervical collar must be on at all times.  Can use second collar given in shower.  Do not bend or turn neck.     Driving restrictions    Complete by:  As directed   No driving until further notice.     Increase activity slowly as tolerated     Complete by:  As directed      Lifting restrictions    Complete by:  As directed   No lifting until further notice.           Follow-up Information    Follow up with Eldred Manges, MD.   Specialty:  Orthopedic Surgery   Why:  need return office visit 2 weeks postop   Contact information:   [address] [place] [address] [phone]       Discharge Plan:  discharge to HOME  Disposition:     Signed: Naida Sleight

## 2015-09-17 ENCOUNTER — Other Ambulatory Visit: Payer: Self-pay | Admitting: Orthopaedic Surgery

## 2015-09-17 DIAGNOSIS — M542 Cervicalgia: Secondary | ICD-10-CM

## 2015-09-25 ENCOUNTER — Ambulatory Visit
Admission: RE | Admit: 2015-09-25 | Discharge: 2015-09-25 | Disposition: A | Discharge status: Home / Self Care | Payer: Managed Care, Other (non HMO) | Source: Ambulatory Visit | Attending: Orthopaedic Surgery | Admitting: Orthopaedic Surgery

## 2015-09-25 DIAGNOSIS — M542 Cervicalgia: Secondary | ICD-10-CM

## 2016-05-11 DIAGNOSIS — Z1371 Encounter for nonprocreative screening for genetic disease carrier status: Secondary | ICD-10-CM | POA: Insufficient documentation

## 2016-06-13 HISTORY — PX: DILATION AND CURETTAGE OF UTERUS: SHX78

## 2016-07-11 IMAGING — CT CT CERVICAL SPINE W/O CM
5 series · 16 of 33 positions shown, 18 images · non-contrast
Comparison: Cervical spine radiographs 09/15/2015. MRI cervical
spine 04/30/2014

CLINICAL DATA: Increasing neck pain over the past few months.
Chronic symptoms for 8 years. Prior cervical fusion in May 2014.

EXAM:
CT CERVICAL SPINE WITHOUT CONTRAST
TECHNIQUE: Multidetector CT imaging of the cervical spine was performed without
intravenous contrast. Multiplanar CT image reconstructions were also
generated.

[Series 3: c spine bone · axial · 0.23mm/px · z∈[+64,+160]mm · 3 of 76 slices shown, 4 images]
[im 19/76  soft-tissue]
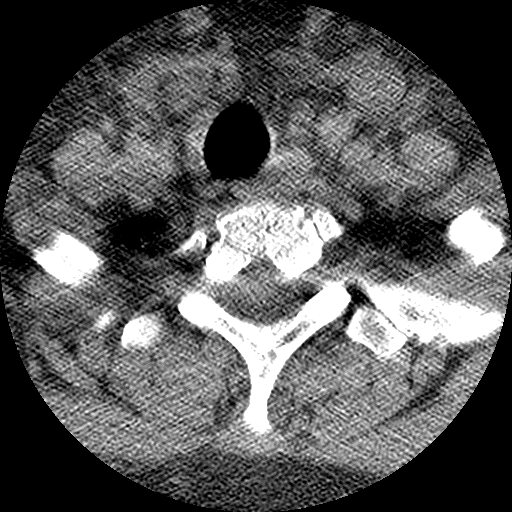
[im 19/76  bone]
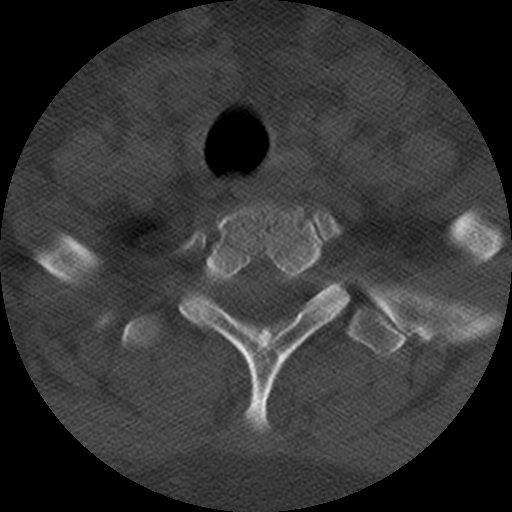
[im 38/76  bone]
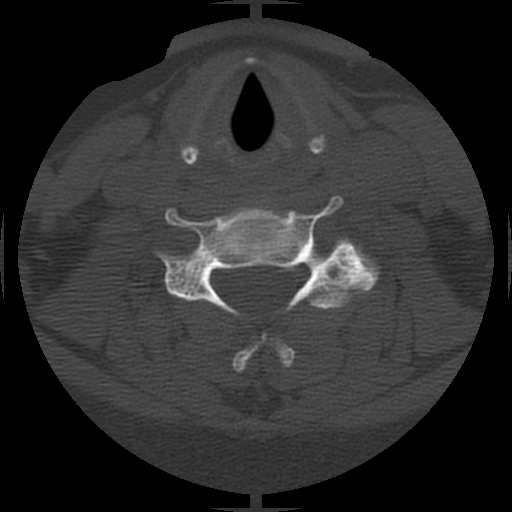
[im 57/76  bone]
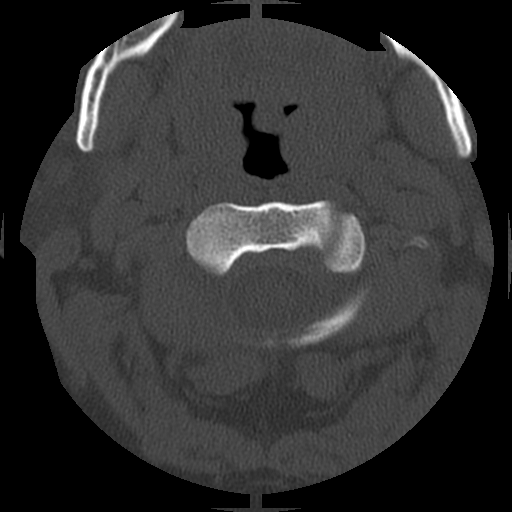

[Series 4: c spine soft · axial · 0.23mm/px · z∈[+82,+144]mm · 2 of 76 slices shown]
[im 26/76  soft-tissue]
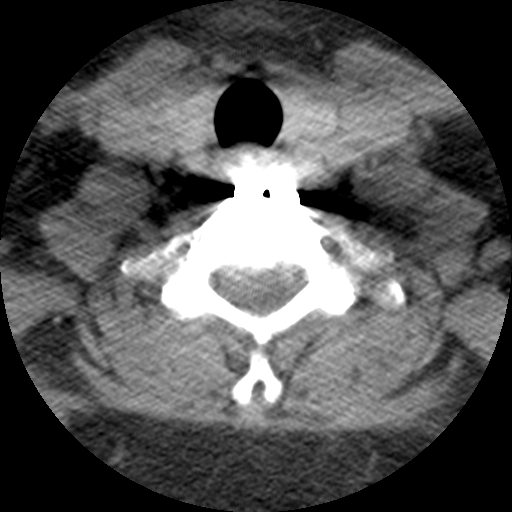
[im 51/76  soft-tissue]
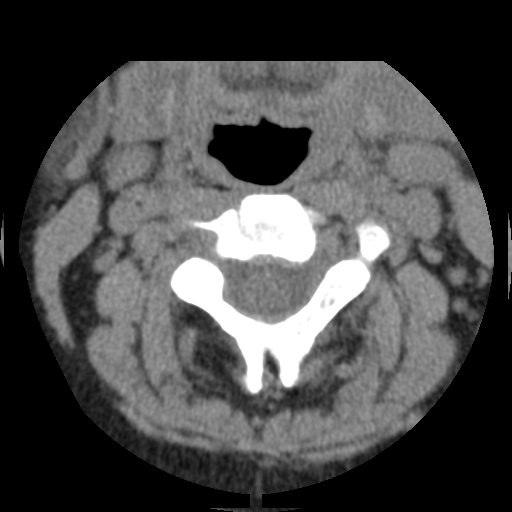

[Series 200: coronal · coronal · 0.38mm/px · 3 of 38 slices shown]
[im 8/38  bone]
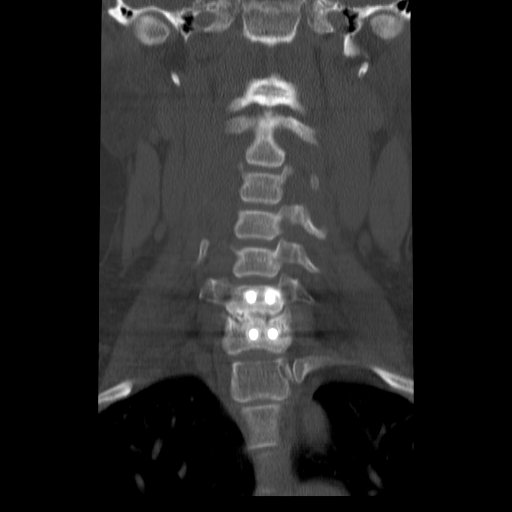
[im 15/38  bone]
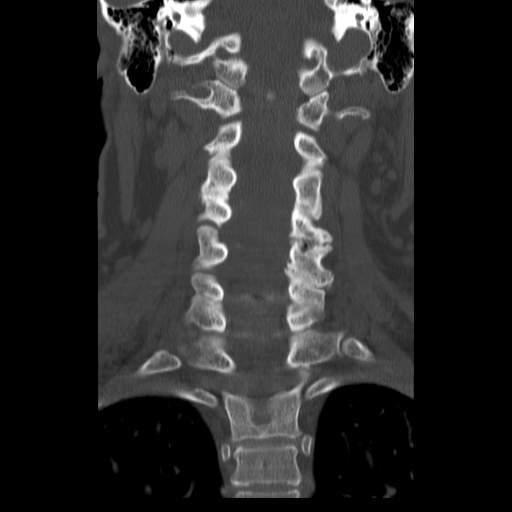
[im 23/38  bone]
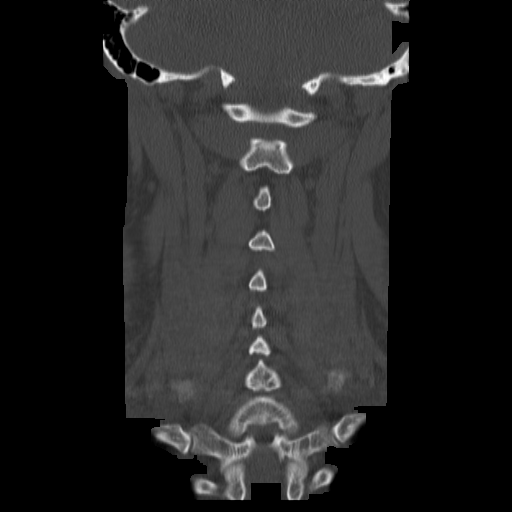

[Series 201: sagittal · sagittal · 0.38mm/px · 5 of 38 slices shown, 6 images]
[im 13/38  bone]
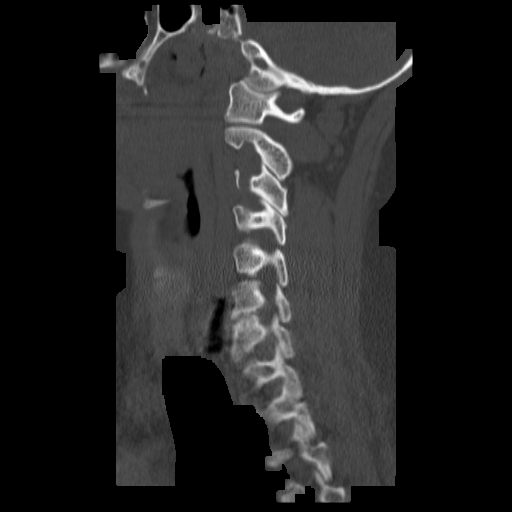
[im 16/38  bone]
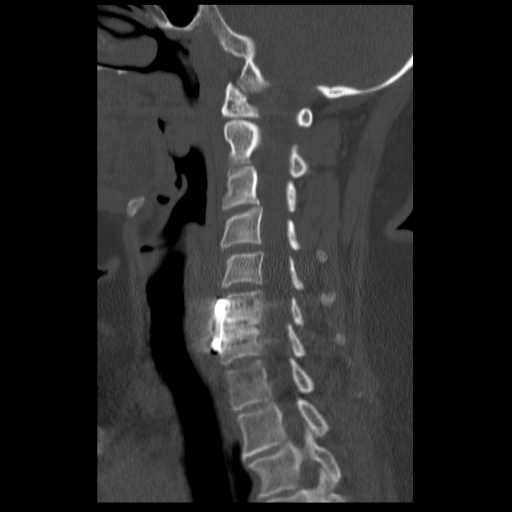
[im 19/38  soft-tissue]
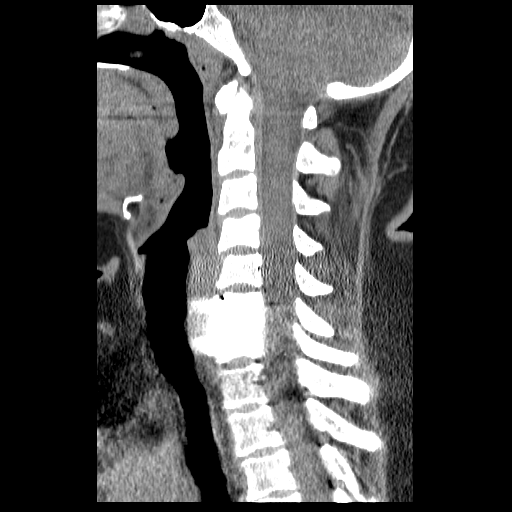
[im 19/38  bone]
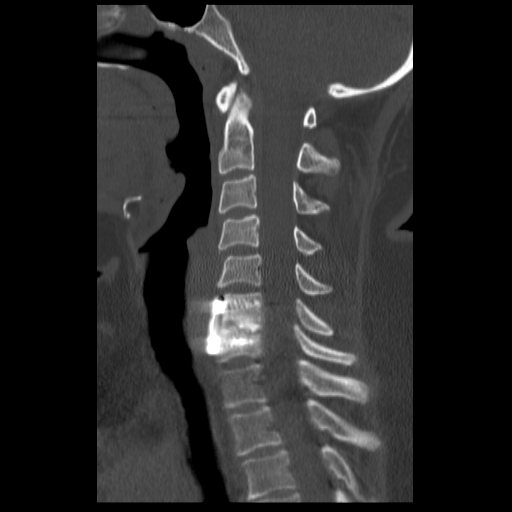
[im 22/38  bone]
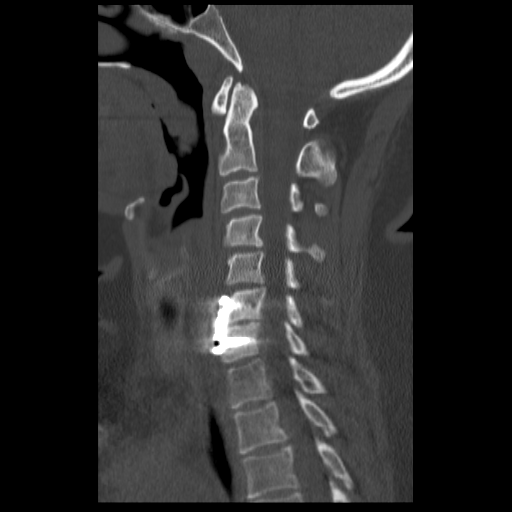
[im 25/38  bone]
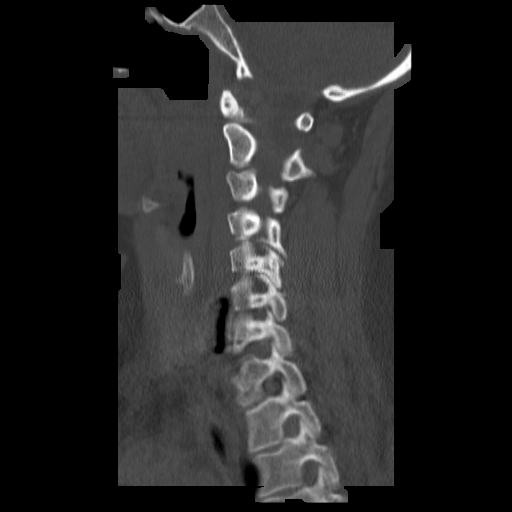

[Series 202: angled axial · axial · 0.38mm/px · z∈[+56,+152]mm · 3 of 97 slices shown]
[im 25/97  bone]
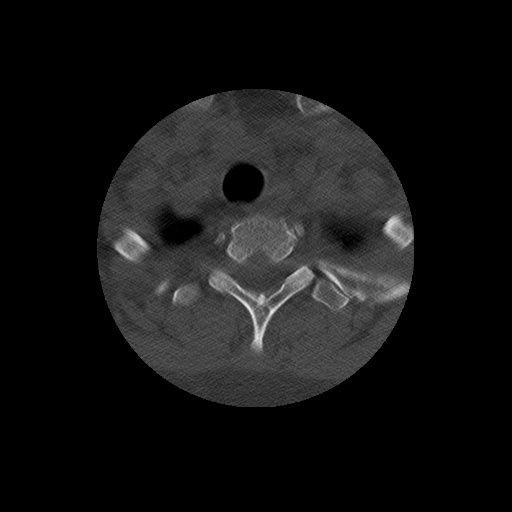
[im 49/97  bone]
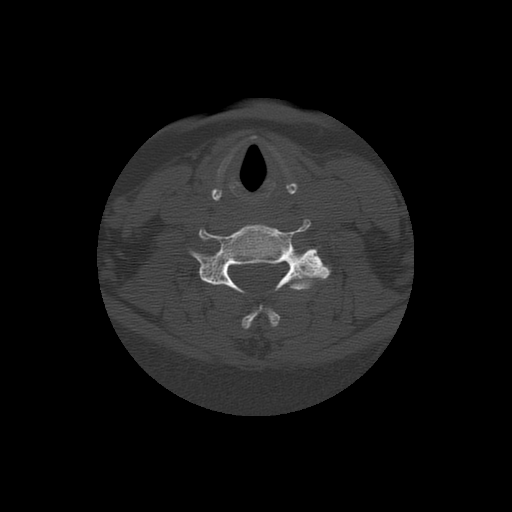
[im 73/97  bone]
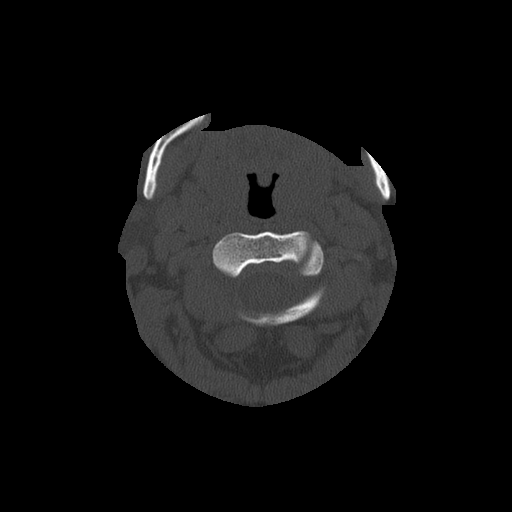

[16 of 33 positions shown; findings below may reference images not displayed]

FINDINGS: Normal alignment of the cervical vertebral bodies except for minimal
anterior subluxation of C4. No acute bony findings. The facets are
normally aligned. Moderate facet disease in the mid and lower
cervical spine bilaterally, left greater than right.

Surgical changes from anterior and interbody fusion at C6-7.
Incomplete interbody fusion. The hardware is intact.

The disc spaces are maintained. No significant disc protrusions or
spinal stenosis. Mild disc space narrowing at C7-T1 but no disc
protrusion, spinal or foraminal stenosis.

Asymmetric left-sided facet disease at C5-6 and mild uncinate
spurring with minimal left foraminal encroachment. No significant
stenosis. Similar findings at C4-5.
IMPRESSION: 1. Anterior and interbody fusion changes at C6-7 without
complicating features. Minimal interbody fusion changes.
2. Mild degenerative anterior subluxation of C4. Otherwise, the
vertebral bodies demonstrate normal alignment. No acute bony
findings.
3. Moderate multilevel facet disease most significant on the left at
C4-5 and C5-6. Mild foraminal encroachment at these levels.
4. No significant disc protrusions, spinal or foraminal stenosis.

## 2016-08-26 LAB — LIPID PANEL
CHOLESTEROL: 179 (ref 0–200)
HDL: 62 (ref 35–70)
LDL Cholesterol: 103
TRIGLYCERIDES: 71 (ref 40–160)

## 2016-08-26 LAB — CBC AND DIFFERENTIAL
HCT: 45 (ref 36–46)
Hemoglobin: 15.3 (ref 12.0–16.0)
Platelets: 293 (ref 150–399)
WBC: 7.8

## 2016-08-26 LAB — HEPATIC FUNCTION PANEL
ALT: 24 (ref 7–35)
AST: 20 (ref 13–35)
Alkaline Phosphatase: 88 (ref 25–125)
Bilirubin, Total: 1.6

## 2016-08-26 LAB — BASIC METABOLIC PANEL
BUN: 11 (ref 4–21)
CREATININE: 0.6 (ref 0.5–1.1)
POTASSIUM: 4.3 (ref 3.4–5.3)
SODIUM: 144 (ref 137–147)

## 2017-10-03 ENCOUNTER — Ambulatory Visit: Payer: Managed Care, Other (non HMO) | Admitting: Family Medicine

## 2017-10-03 ENCOUNTER — Encounter: Payer: Self-pay | Admitting: Family Medicine

## 2017-10-03 VITALS — BP 101/71 | HR 63 | Temp 98.0°F | Ht <= 58 in | Wt 216.2 lb

## 2017-10-03 DIAGNOSIS — Z7689 Persons encountering health services in other specified circumstances: Secondary | ICD-10-CM | POA: Diagnosis not present

## 2017-10-03 DIAGNOSIS — F320 Major depressive disorder, single episode, mild: Secondary | ICD-10-CM | POA: Diagnosis not present

## 2017-10-03 DIAGNOSIS — M25569 Pain in unspecified knee: Secondary | ICD-10-CM | POA: Diagnosis not present

## 2017-10-03 DIAGNOSIS — F909 Attention-deficit hyperactivity disorder, unspecified type: Secondary | ICD-10-CM

## 2017-10-03 MED ORDER — AMPHETAMINE-DEXTROAMPHET ER 15 MG PO CP24: 15.0000 mg | ORAL_CAPSULE | Freq: Two times a day (BID) | ORAL | 0 refills | Status: DC

## 2017-10-03 MED ORDER — SERTRALINE HCL 100 MG PO TABS: 100.0000 mg | ORAL_TABLET | Freq: Every day | ORAL | 1 refills | Status: DC

## 2017-10-03 NOTE — Progress Notes (Signed)
[name] , 04/24/1976, 42 y.o., female MRN: [id number] Patient Care Team    Relationship Specialty Notifications Start End  [name] LeatherwoodKuneff, Renee A, DO PCP - General Family Medicine  10/03/17   [name] AoMekala, Kavya C, MD Referring Physician Internal Medicine  10/04/17   [name], OD Referring Physician Optometry  10/04/17   [name], MD Referring Physician Obstetrics and Gynecology  10/04/17     Chief Complaint  Patient presents with  . Establish Care    Knee Pain X 6 days     Subjective: New patient establishment with acute and chronic complaints. Pt presents for an OV with complaints of knee pain of 6 days duration.  Associated symptoms include increase in activity.  She was on [name] camping trip and was doing more walking and hiking around.  She reports the discomfort is anterior and inferior to knee  (points to location ). she denies any current swelling or redness.  She has been taking NSAIDs for comfort.  She has been icing the area.  Denies prior history of trauma or arthritis.  She has never had surgery on her knee.  Depression: Patient is prescribed Zoloft 100 mg daily.  She reports she has been on multiple different medications with either side effects or imminent failure with Cymbalta, Lexapro, Trintellex, Brintellix and Paxil.  She reports with the Zoloft she is doing rather well.  ADD: She has [name] history of attention deficit disorder.  She has been tried on different medications over the course of time and secondary to insurance purposes she is back on Adderall 15 mg twice daily.  She reports she does well on this medication.    Depression screen [name] 10/03/2017  Decreased Interest 0  Down, Depressed, Hopeless 0  PHQ - 2 Score 0  Altered sleeping 3  Tired, decreased energy 3  Change in appetite 1  Feeling bad or failure about yourself  0  Trouble concentrating 3  Moving slowly or fidgety/restless 0  Suicidal thoughts 0  PHQ-9 Score 10  Difficult doing  work/chores Not difficult at all    Allergies  Allergen Reactions  . Ativan [Lorazepam] Other (See Comments)    hyperactivity  . Penicillins Rash   Social History   Tobacco Use  . Smoking status: Never Smoker  . Smokeless tobacco: Never Used  Substance Use Topics  . Alcohol use: No   Past Medical History:  Diagnosis Date  . ADD (attention deficit disorder)   . Arthritis   . Chicken pox   . Chronic neck pain    HNP  . Depression   . Headache   . Heart murmur    as [name] baby  . Insomnia   . Nocturia   . Sleep apnea    CPAP-SLEEP STUDY DONE ABOUT 2 YRS AGO  . Thyroid disease   . UTI (urinary tract infection)    Past Surgical History:  Procedure Laterality Date  . ANTERIOR CERVICAL DECOMP/DISCECTOMY FUSION N/[name] 06/02/2014   Procedure: C6-7 Anterior Cervical Discectomy and Fusion, Allograft, Plate;  Surgeon: [name] MangesMark [name] Yates, MD;  Location: [facility] OR;  Service: Orthopedics;  Laterality: N/[name];  . CESAREAN SECTION  2004/2007  . DILATION AND CURETTAGE OF UTERUS  2018  . wisdom teeth extracted      Family History  Problem Relation Age of Onset  . Arthritis Mother   . Depression Mother   . Drug abuse Mother   . Hyperlipidemia Mother   . Hypertension Mother   .  Alcohol abuse Father   . Cancer Sister   . Cervical cancer Sister   . Early death Son   . Cancer Maternal Grandmother   . Breast cancer Maternal Grandmother   . Heart disease Paternal Grandmother   . Alcohol abuse Paternal Grandfather    Allergies as of 10/03/2017      Reactions   Ativan [lorazepam] Other (See Comments)   hyperactivity   Penicillins Rash      Medication List        Accurate as of 10/03/17 11:59 PM. Always use your most recent med list.          amphetamine-dextroamphetamine 15 MG tablet Commonly known as:  ADDERALL Take 1 tablet by mouth 2 (two) times daily.   lansoprazole 30 MG capsule Commonly known as:  PREVACID Take by mouth.   sertraline 100 MG tablet Commonly known as:   ZOLOFT Take 1 tablet (100 mg total) by mouth daily.       All past medical history, surgical history, allergies, family history, immunizations andmedications were updated in the EMR today and reviewed under the history and medication portions of their EMR.     ROS: Negative, with the exception of above mentioned in HPI   Objective:  BP 101/71 (BP Location: Right Arm, Patient Position: Sitting, Cuff Size: Large)   Pulse 63   Temp 98 F (36.7 [name]) (Oral)   Ht 4' 9.75" (1.467 m)   Wt 216 lb 3.2 oz (98.1 kg)   LMP 09/20/2017   SpO2 97%   BMI 45.58 kg/m  Body mass index is 45.58 kg/m. Gen: Afebrile. No acute distress. Nontoxic in appearance, well developed, well nourished.  Pleasant, obese, Caucasian female. HENT: AT. [place]. MMM, no oral lesions. Eyes:Pupils Equal Round Reactive to light, Extraocular movements intact,  Conjunctiva without redness, discharge or icterus. CV: RRR no murmur, no edema Chest: CTAB, no wheeze or crackles. Good air movement, normal resp effort.  Abd: Soft. NTND. BS present.  MSK (knee): Erythema, no soft tissue swelling.  No effusions present.  No tenderness to palpation.  No joint line tenderness.  No ligament laxity.  Negative Lachman's.  Neurovascularly intact distally. Neuro: Normal gait. PERLA. EOMi. Alert. Oriented x3  Psych: Normal affect, dress and demeanor. Normal speech. Normal thought content and judgment.  No exam data present No results found. No results found for this or any previous visit (from the past 24 hour(s)).  Assessment/Plan: [name] is [name] 42 y.o. female present for OV for  Attention deficit hyperactivity disorder (ADHD), unspecified ADHD type -Patient is stable without side effects on Adderall 15 mg twice daily.  Option provided today for 3 months. -Controlled substance contract signed today. -[place] controlled substance database reviewed and appropriate today. - amphetamine-dextroamphetamine (ADDERALL) 15 MG  tablet; Take 1 tablet by mouth 2 (two) times daily.  Dispense: 180 tablet; Refill: 0 -Follow-up in 3 months, if stable okay to see every 6 months.  Depression, major, single episode, mild (HCC) Stable.  Continue Zoloft 100 mg daily.  Refills provided today for her. Follow-up 6 months.  Acute knee pain, unspecified laterality New.  Improving.  Knee strain from overactivity.  RICE therapy recommended. NSAIDS.  - F/U 1-2 weeks if does not continue to improve or if worsening.   Reviewed expectations re: course of current medical issues.  Discussed self-management of symptoms.  Outlined signs and symptoms indicating need for more acute intervention.  Patient verbalized understanding and all questions were answered.  Patient received  an Optometrist.    No orders of the defined types were placed in this encounter.  Greater than 45 minutes was spent with patient, greater than 50% of that time was spent face-to-face with patient counseling and/or coordinating care.    Note is dictated utilizing voice recognition software. Although note has been proof read prior to signing, occasional typographical errors still can be missed. If any questions arise, please do not hesitate to call for verification.   electronically signed by:  Felix Pacini, DO  [facility] - OR

## 2017-10-03 NOTE — Patient Instructions (Signed)
Medication refills provided today. Followup in 3 months.  If stable at f/u with extend to every 6 months.   It was a pleasure meeting you today.   Your knee appears strained. Time, rest, ice and nsaids recommended.   Please help [place] help you:  We are honored you have chosen [facility] for your Primary Care home. Below you will find basic instructions that you may need to access in the future. Please help [place] help you by reading the instructions, which cover many of the frequent questions we experience.   Prescription refills and request:  -In order to allow more efficient response time, please call your pharmacy for all refills. They will forward the request electronically to [place]. This allows for the quickest possible response. Request left on a nurse line can take longer to refill, since these are checked as time allows between office patients and other phone calls.  - refill request can take up to 3-5 working days to complete.  - If request is sent electronically and request is appropiate, it is usually completed in 1-2 business days.  - all patients will need to be seen routinely for all chronic medical conditions requiring prescription medications (see follow-up below). If you are overdue for follow up on your condition, you will be asked to make an appointment and we will call in enough medication to cover you until your appointment (up to 30 days).  - all controlled substances will require a face to face visit to request/refill.  - if you desire your prescriptions to go through a new pharmacy, and have an active script at original pharmacy, you will need to call your pharmacy and have scripts transferred to new pharmacy. This is completed between the pharmacy locations and not by your provider.    Results: If any images or labs were ordered, it can take up to 1 week to get results depending on the test ordered and the lab/facility running and resulting the test. - Normal or stable  results, which do not need further discussion, may be released to your mychart immediately with attached note to you. A call may not be generated for normal results. Please make certain to sign up for mychart. If you have questions on how to activate your mychart you can call the front office.  - If your results need further discussion, our office will attempt to contact you via phone, and if unable to reach you after 2 attempts, we will release your abnormal result to your mychart with instructions.  - All results will be automatically released in mychart after 1 week.  - Your provider will provide you with explanation and instruction on all relevant material in your results. Please keep in mind, results and labs may appear confusing or abnormal to the untrained eye, but it does not mean they are actually abnormal for you personally. If you have any questions about your results that are not covered, or you desire more detailed explanation than what was provided, you should make an appointment with your provider to do so.   Our office handles many outgoing and incoming calls daily. If we have not contacted you within 1 week about your results, please check your mychart to see if there is a message first and if not, then contact our office.  In helping with this matter, you help decrease call volume, and therefore allow [place] to be able to respond to patients needs more efficiently.   Acute office visits (sick visit):  An acute visit is intended for a new problem and are scheduled in shorter time slots to allow schedule openings for patients with new problems. This is the appropriate visit to discuss a new problem. In order to provide you with excellent quality medical care with proper time for you to explain your problem, have an exam and receive treatment with instructions, these appointments should be limited to one new problem per visit. If you experience a new problem, in which you desire to be addressed,  please make an acute office visit, we save openings on the schedule to accommodate you. Please do not save your new problem for any other type of visit, let us take care of it properly and quickly for you.   Follow up visits:  Depending on your condition(s) your provider will need to see you routinely in order to provide you with quality care and prescribe medication(s). Most chronic conditions (Example: hypertension, Diabetes, depression/anxiety... etc), require visits a couple times a year. Your provider will instruct you on proper follow up for your personal medical conditions and history. Please make certain to make follow up appointments for your condition as instructed. Failing to do so could result in lapse in your medication treatment/refills. If you request a refill, and are overdue to be seen on a condition, we will always provide you with a 30 day script (once) to allow you time to schedule.    Medicare wellness (well visit): - we have a wonderful Nurse Selena Batten(Kim), that will meet with you and provide you will yearly medicare wellness visits. These visits should occur yearly (can not be scheduled less than 1 calendar year apart) and cover preventive health, immunizations, advance directives and screenings you are entitled to yearly through your medicare benefits. Do not miss out on your entitled benefits, this is when medicare will pay for these benefits to be ordered for you.  These are strongly encouraged by your provider and is the appropriate type of visit to make certain you are up to date with all preventive health benefits. If you have not had your medicare wellness exam in the last 12 months, please make certain to schedule one by calling the office and schedule your medicare wellness with Selena BattenKim as soon as possible.   Yearly physical (well visit):  - Adults are recommended to be seen yearly for physicals. Check with your insurance and date of your last physical, most insurances require one  calendar year between physicals. Physicals include all preventive health topics, screenings, medical exam and labs that are appropriate for gender/age and history. You may have fasting labs needed at this visit. This is a well visit (not a sick visit), new problems should not be covered during this visit (see acute visit).  - Pediatric patients are seen more frequently when they are younger. Your provider will advise you on well child visit timing that is appropriate for your their age. - This is not a medicare wellness visit. Medicare wellness exams do not have an exam portion to the visit. Some medicare companies allow for a physical, some do not allow a yearly physical. If your medicare allows a yearly physical you can schedule the medicare wellness with our nurse Selena BattenKim and have your physical with your provider after, on the same day. Please check with insurance for your full benefits.   Late Policy/No Shows:  - all new patients should arrive 15-30 minutes earlier than appointment to allow us time  to  obtain all personal demographics,  insurance information and for you to complete office paperwork. - All established patients should arrive 10-15 minutes earlier than appointment time to update all information and be checked in .  - In our best efforts to run on time, if you are late for your appointment you will be asked to either reschedule or if able, we will work you back into the schedule. There will be a wait time to work you back in the schedule,  depending on availability.  - If you are unable to make it to your appointment as scheduled, please call 24 hours ahead of time to allow [place] to fill the time slot with someone else who needs to be seen. If you do not cancel your appointment ahead of time, you may be charged a no show fee.

## 2017-10-04 ENCOUNTER — Encounter: Payer: Self-pay | Admitting: Family Medicine

## 2017-10-04 MED ORDER — AMPHETAMINE-DEXTROAMPHETAMINE 15 MG PO TABS: 15.0000 mg | ORAL_TABLET | Freq: Two times a day (BID) | ORAL | 0 refills | Status: DC

## 2017-10-31 ENCOUNTER — Encounter: Payer: Self-pay | Admitting: Family Medicine

## 2017-10-31 ENCOUNTER — Ambulatory Visit (INDEPENDENT_AMBULATORY_CARE_PROVIDER_SITE_OTHER): Payer: Managed Care, Other (non HMO) | Admitting: Family Medicine

## 2017-10-31 ENCOUNTER — Encounter: Payer: Self-pay | Admitting: *Deleted

## 2017-10-31 VITALS — BP 100/72 | HR 79 | Temp 98.1°F | Resp 20 | Ht <= 58 in | Wt 216.5 lb

## 2017-10-31 DIAGNOSIS — Z79899 Other long term (current) drug therapy: Secondary | ICD-10-CM | POA: Diagnosis not present

## 2017-10-31 DIAGNOSIS — F320 Major depressive disorder, single episode, mild: Secondary | ICD-10-CM

## 2017-10-31 DIAGNOSIS — Z131 Encounter for screening for diabetes mellitus: Secondary | ICD-10-CM

## 2017-10-31 DIAGNOSIS — Z130 Encounter for screening for diseases of the blood and blood-forming organs and certain disorders involving the immune mechanism: Secondary | ICD-10-CM | POA: Diagnosis not present

## 2017-10-31 DIAGNOSIS — Z0001 Encounter for general adult medical examination with abnormal findings: Secondary | ICD-10-CM | POA: Diagnosis not present

## 2017-10-31 DIAGNOSIS — Z1239 Encounter for other screening for malignant neoplasm of breast: Secondary | ICD-10-CM

## 2017-10-31 DIAGNOSIS — Z1231 Encounter for screening mammogram for malignant neoplasm of breast: Secondary | ICD-10-CM

## 2017-10-31 LAB — CBC WITH DIFFERENTIAL/PLATELET
BASOS ABS: 0.2 10*3/uL — AB (ref 0.0–0.1)
Basophils Relative: 2.7 % (ref 0.0–3.0)
Eosinophils Absolute: 0.1 10*3/uL (ref 0.0–0.7)
Eosinophils Relative: 1.5 % (ref 0.0–5.0)
HCT: 46.6 % — ABNORMAL HIGH (ref 36.0–46.0)
Hemoglobin: 15.7 g/dL — ABNORMAL HIGH (ref 12.0–15.0)
Lymphocytes Relative: 33.7 % (ref 12.0–46.0)
Lymphs Abs: 2.7 10*3/uL (ref 0.7–4.0)
MCHC: 33.6 g/dL (ref 30.0–36.0)
MCV: 88.8 fl (ref 78.0–100.0)
MONO ABS: 0.7 10*3/uL (ref 0.1–1.0)
Monocytes Relative: 8.4 % (ref 3.0–12.0)
NEUTROS ABS: 4.4 10*3/uL (ref 1.4–7.7)
NEUTROS PCT: 53.7 % (ref 43.0–77.0)
PLATELETS: 337 10*3/uL (ref 150.0–400.0)
RBC: 5.25 Mil/uL — ABNORMAL HIGH (ref 3.87–5.11)
RDW: 13.7 % (ref 11.5–15.5)
WBC: 8.1 10*3/uL (ref 4.0–10.5)

## 2017-10-31 LAB — COMPREHENSIVE METABOLIC PANEL
ALT: 10 U/L (ref 0–35)
AST: 12 U/L (ref 0–37)
Albumin: 4.2 g/dL (ref 3.5–5.2)
Alkaline Phosphatase: 84 U/L (ref 39–117)
BUN: 12 mg/dL (ref 6–23)
CO2: 27 mEq/L (ref 19–32)
Calcium: 9 mg/dL (ref 8.4–10.5)
Chloride: 103 mEq/L (ref 96–112)
Creatinine, Ser: 0.66 mg/dL (ref 0.40–1.20)
GFR: 105.23 mL/min (ref >60.00)
Glucose, Bld: 85 mg/dL (ref 70–99)
Potassium: 4.8 mEq/L (ref 3.5–5.1)
SODIUM: 137 meq/L (ref 135–145)
Total Bilirubin: 1 mg/dL (ref 0.2–1.2)
Total Protein: 7 g/dL (ref 6.0–8.3)

## 2017-10-31 LAB — LIPID PANEL
Cholesterol: 165 mg/dL (ref 0–200)
HDL: 48.5 mg/dL (ref >39.00)
LDL Cholesterol: 107 mg/dL — ABNORMAL HIGH (ref 0–99)
NonHDL: 116.58
Total CHOL/HDL Ratio: 3
Triglycerides: 50 mg/dL (ref 0.0–149.0)
VLDL: 10 mg/dL (ref 0.0–40.0)

## 2017-10-31 LAB — TSH: TSH: 4.99 u[IU]/mL — ABNORMAL HIGH (ref 0.35–4.50)

## 2017-10-31 LAB — HEMOGLOBIN A1C: Hgb A1c MFr Bld: 5.4 % (ref 4.6–6.5)

## 2017-10-31 NOTE — Patient Instructions (Signed)

## 2017-10-31 NOTE — Progress Notes (Signed)
Patient ID: [name], female  DOB: 01-Sep-1975, 42 y.o.   MRN: [id number] Patient Care Team    Relationship Specialty Notifications Start End  Ma Hillock, DO PCP - General Family Medicine  10/03/17   Tawni Levy, MD Referring Physician Internal Medicine  10/04/17   Christain Sacramento, [place] Referring Physician Optometry  10/04/17   Rodena Goldmann., MD Referring Physician Obstetrics and Gynecology  10/04/17     Chief Complaint  Patient presents with  . Annual Exam    Subjective:  [name] is a 42 y.o.  Female  present for CPE. All past medical history, surgical history, allergies, family history, immunizations, medications and social history were updated in the electronic medical record today. All recent labs, ED visits and hospitalizations within the last year were reviewed.  Health maintenance:  Colonoscopy: No fhx, routine screen.   Mammogram: Fhx breast ca in [facility]. 1st mam screen ordered today. Encouraged SBE.  Cervical cancer screening:completed by Dr. Larey Days [facility]), 07/02/2013, resutls nl/neg HPV. Has an appt scheduled, reviewed in CE.  Immunizations: tdap UTD 2012, Influenza  (encouraged yearly) Infectious disease screening: HIV completed 2007 DEXA: routine screen Assistive device: none Oxygen LEX:NTZG Patient has a Dental home. Hospitalizations/ED visits: reviewed  Depression screen [facility] 2/9 10/31/2017 10/03/2017  Decreased Interest 0 0  Down, Depressed, Hopeless 0 0  PHQ - 2 Score 0 0  Altered sleeping 3 3  Tired, decreased energy 3 3  Change in appetite 1 1  Feeling bad or failure about yourself  0 0  Trouble concentrating 0 3  Moving slowly or fidgety/restless 0 0  Suicidal thoughts 0 0  PHQ-9 Score 7 10  Difficult doing work/chores - Not difficult at all   GAD 7 : Generalized Anxiety Score 10/03/2017  Nervous, Anxious, on Edge 1  Control/stop worrying 1  Worry too much - different things 1  Trouble relaxing 2  Restless 1    Easily annoyed or irritable 0  Afraid - awful might happen 0  Total GAD 7 Score 6  Anxiety Difficulty Not difficult at all     Current Exercise Habits: The patient does not participate in regular exercise at present Exercise limited by: None identified   Immunization History  Administered Date(s) Administered  . Influenza Inj Mdck Quad With Preservative 03/14/2014  . Influenza, Seasonal, Injecte, Preservative Fre 03/13/2010, 04/05/2011  . Influenza-Unspecified 04/05/2011  . Tdap 04/05/2011     Past Medical History:  Diagnosis Date  . ADD (attention deficit disorder)   . Arthritis   . Chicken pox   . Chronic neck pain    HNP  . Depression   . Headache   . Heart murmur    as a baby  . Insomnia   . Nocturia   . Sleep apnea    CPAP-SLEEP STUDY DONE ABOUT 2 YRS AGO  . Thyroid disease   . UTI (urinary tract infection)    Allergies  Allergen Reactions  . Ativan [Lorazepam] Other (See Comments)    hyperactivity  . Penicillins Rash   Past Surgical History:  Procedure Laterality Date  . ANTERIOR CERVICAL DECOMP/DISCECTOMY FUSION N/A 06/02/2014   Procedure: C6-7 Anterior Cervical Discectomy and Fusion, Allograft, Plate;  Surgeon: Marybelle Killings, MD;  Location: [place];  Service: Orthopedics;  Laterality: N/A;  . CESAREAN SECTION  2004/2007  . DILATION AND CURETTAGE OF UTERUS  2018  . wisdom teeth extracted      Family History  Problem Relation  Age of Onset  . Arthritis Mother   . Depression Mother   . Drug abuse Mother   . Hyperlipidemia Mother   . Hypertension Mother   . Alcohol abuse Father   . Cancer Sister   . Cervical cancer Sister   . Early death Son   . Cancer Maternal Grandmother   . Breast cancer Maternal Grandmother   . Heart disease Paternal Grandmother   . Alcohol abuse Paternal Grandfather    Social History   Socioeconomic History  . Marital status: Married    Spouse name: Not on file  . Number of children: 2  . Years of education: 100  .  Highest education level: Not on file  Occupational History  . Occupation: Occupational psychologist  Social Needs  . Financial resource strain: Not on file  . Food insecurity:    Worry: Not on file    Inability: Not on file  . Transportation needs:    Medical: Not on file    Non-medical: Not on file  Tobacco Use  . Smoking status: Never Smoker  . Smokeless tobacco: Never Used  Substance and Sexual Activity  . Alcohol use: No  . Drug use: No  . Sexual activity: Yes    Partners: Male    Birth control/protection: Pill  Lifestyle  . Physical activity:    Days per week: Not on file    Minutes per session: Not on file  . Stress: Not on file  Relationships  . Social connections:    Talks on phone: Not on file    Gets together: Not on file    Attends religious service: Not on file    Active member of club or organization: Not on file    Attends meetings of clubs or organizations: Not on file    Relationship status: Not on file  . Intimate partner violence:    Fear of current or ex partner: Not on file    Emotionally abused: Not on file    Physically abused: Not on file    Forced sexual activity: Not on file  Other Topics Concern  . Not on file  Social History Narrative   Married.  2 children.   Some college.  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  Uses herbal remedies.   Smoke alarms in the home.  Wears her seatbelt.   Feels safe in her relationships.   Allergies as of 10/31/2017      Reactions   Ativan [lorazepam] Other (See Comments)   hyperactivity   Penicillins Rash      Medication List        Accurate as of 10/31/17  8:51 AM. Always use your most recent med list.          amphetamine-dextroamphetamine 15 MG tablet Commonly known as:  ADDERALL Take 1 tablet by mouth 2 (two) times daily.   lansoprazole 30 MG capsule Commonly known as:  PREVACID Take by mouth.   sertraline 100 MG tablet Commonly known as:  ZOLOFT Take 1 tablet (100 mg total) by  mouth daily.       All past medical history, surgical history, allergies, family history, immunizations andmedications were updated in the EMR today and reviewed under the history and medication portions of their EMR.     No results found for this or any previous visit (from the past 2160 hour(s)).  ROS: 14 pt review of systems performed and negative (unless mentioned in an HPI)  Objective: BP 100/72 (  BP Location: Left Arm, Patient Position: Sitting, Cuff Size: Large)   Pulse 79   Temp 98.1 F (36.7 C)   Resp 20   Ht '4\' 10"'$  (1.473 m)   Wt 216 lb 8 oz (98.2 kg)   LMP 10/03/2017   SpO2 97%   BMI 45.25 kg/m  Gen: Afebrile. No acute distress. Nontoxic in appearance, well-developed, well-nourished,  Pleasant obese female.  HENT: AT. [place]. Bilateral TM visualized and normal in appearance, normal external auditory canal. MMM, no oral lesions, adequate dentition. Bilateral nares within normal limits. Throat withoutno erythema, ulcerations or exudates. no Cough on exam, no hoarseness on exam. Eyes:Pupils Equal Round Reactive to light, Extraocular movements intact,  Conjunctiva without redness, discharge or icterus. Neck/lymp/endocrine: Supple,no lymphadenopathy, no thyromegaly CV: RRR no murmur, no edema, +2/4 P posterior tibialis pulses. no carotid bruits. No JVD. Chest: CTAB, no wheeze, rhonchi or crackles. normal Respiratory effort. good Air movement. Abd: Soft. obese. NTND. BS present. no Masses palpated. No hepatosplenomegaly. No rebound tenderness or guarding. Skin: no rashes, purpura or petechiae. Warm and well-perfused. Skin intact. Neuro/Msk:  Normal gait. PERLA. EOMi. Alert. Oriented x3.  Cranial nerves II through XII intact. Muscle strength 5/5 upper/lower extremity. DTRs equal bilaterally. Psych: Normal affect, dress and demeanor. Normal speech. Normal thought content and judgment.   No exam data present  Assessment/plan: [name] is a 42 y.o. female present for  CPE. Depression, major, single episode, mild (HCC) - stable continue zoloft has follow up every 3 months with ADHD. - TSH Morbid obesity ([place]) - diet and exercise modifications suggested - Lipid panel Encounter for long-term (current) use of medications - Comp Met (CMET) Screening for diabetes mellitus - HgB A1c Screening for iron deficiency anemia - CBC w/Diff Breast cancer screening - MM 3D SCREEN BREAST BILATERAL; Future Encounter for general adult medical examination with abnormal findings Patient was encouraged to exercise greater than 150 minutes a week. Patient was encouraged to choose a diet filled with fresh fruits and vegetables, and lean meats. AVS provided to patient today for education/recommendation on gender specific health and safety maintenance. Colonoscopy: No fhx, routine screen.   Mammogram: [facility]x breast ca in [facility]. 1st mam screen ordered today. Encouraged SBE.  Cervical cancer screening:completed by Dr. Larey Days [facility]), 07/02/2013, resutls nl/neg HPV. Has an appt scheduled, reviewed in CE.  Immunizations: tdap UTD 2012, Influenza  (encouraged yearly) Infectious disease screening: HIV completed 2007 DEXA: routine screen  Return in about 1 year (around 11/01/2018) for CPE. 3 mos [facility] Electronically signed by: Howard Pouch, DO [place]

## 2017-11-01 ENCOUNTER — Telehealth: Payer: Self-pay | Admitting: Family Medicine

## 2017-11-01 DIAGNOSIS — R7989 Other specified abnormal findings of blood chemistry: Secondary | ICD-10-CM | POA: Insufficient documentation

## 2017-11-01 DIAGNOSIS — D582 Other hemoglobinopathies: Secondary | ICD-10-CM

## 2017-11-01 NOTE — Telephone Encounter (Signed)
Please inform patient the following information: Her thyroid level is mildly abnormal, this level can was/wane naturally and does not need medical correction as long as returns to normal. However we will need to repeat this lab in 8 weeks to make certain it returns to normal and she is not starting to become hypothyroid.   Her hemoglobin/HCT is also mildly high, this is the opposite of anemic. This is seen in dehydration or in a pt that is smoking or have OSA, and reverses with hydration or smoking cessation. There are multiple other potential causes that would need intervention if it continues to rise. We will recheck this as well in 8 weeks.   Provider appt 8 weeks.

## 2017-11-01 NOTE — Telephone Encounter (Signed)
Left detailed message with results and instructions on patient voice mail per DPR patient needs follow up appt with Dr Claiborne Billings in 8 weeks

## 2017-11-23 ENCOUNTER — Ambulatory Visit: Payer: Managed Care, Other (non HMO) | Admitting: Family Medicine

## 2017-11-23 ENCOUNTER — Encounter: Payer: Self-pay | Admitting: Family Medicine

## 2017-11-23 VITALS — BP 101/68 | HR 90 | Temp 98.6°F | Resp 20 | Ht <= 58 in | Wt 210.0 lb

## 2017-11-23 DIAGNOSIS — J0100 Acute maxillary sinusitis, unspecified: Secondary | ICD-10-CM

## 2017-11-23 MED ORDER — DOXYCYCLINE HYCLATE 100 MG PO TABS: 100.0000 mg | ORAL_TABLET | Freq: Two times a day (BID) | ORAL | 0 refills | Status: DC

## 2017-11-23 MED ORDER — PREDNISONE 20 MG PO TABS: ORAL_TABLET | ORAL | 0 refills | Status: DC

## 2017-11-23 NOTE — Patient Instructions (Addendum)
Rest, hydrate.  + flonase, mucinex (DM if cough) Doxy and prednisone prescribed, take until completed.  Start daily allergy med OTC (zyrtec at night is a good med) If cough present it can last up to 6-8 weeks.  F/U 2 weeks of not improved.    Sinusitis, Adult Sinusitis is soreness and inflammation of your sinuses. Sinuses are hollow spaces in the bones around your face. They are located:  Around your eyes.  In the middle of your forehead.  Behind your nose.  In your cheekbones.  Your sinuses and nasal passages are lined with a stringy fluid (mucus). Mucus normally drains out of your sinuses. When your nasal tissues get inflamed or swollen, the mucus can get trapped or blocked so air cannot flow through your sinuses. This lets bacteria, viruses, and funguses grow, and that leads to infection. Follow these instructions at home: Medicines  Take, use, or apply over-the-counter and prescription medicines only as told by your doctor. These may include nasal sprays.  If you were prescribed an antibiotic medicine, take it as told by your doctor. Do not stop taking the antibiotic even if you start to feel better. Hydrate and Humidify  Drink enough water to keep your pee (urine) clear or pale yellow.  Use a cool mist humidifier to keep the humidity level in your home above 50%.  Breathe in steam for 10-15 minutes, 3-4 times a day or as told by your doctor. You can do this in the bathroom while a hot shower is running.  Try not to spend time in cool or dry air. Rest  Rest as much as possible.  Sleep with your head raised (elevated).  Make sure to get enough sleep each night. General instructions  Put a warm, moist washcloth on your face 3-4 times a day or as told by your doctor. This will help with discomfort.  Wash your hands often with soap and water. If there is no soap and water, use hand sanitizer.  Do not smoke. Avoid being around people who are smoking (secondhand  smoke).  Keep all follow-up visits as told by your doctor. This is important. Contact a doctor if:  You have a fever.  Your symptoms get worse.  Your symptoms do not get better within 10 days. Get help right away if:  You have a very bad headache.  You cannot stop throwing up (vomiting).  You have pain or swelling around your face or eyes.  You have trouble seeing.  You feel confused.  Your neck is stiff.  You have trouble breathing. This information is not intended to replace advice given to you by your health care provider. Make sure you discuss any questions you have with your health care provider. Document Released: 11/16/2007 Document Revised: 01/24/2016 Document Reviewed: 03/25/2015 Elsevier Interactive Patient Education  [employer]2018 Elsevier Inc.

## 2017-11-23 NOTE — Progress Notes (Signed)
[name] , 1975/08/19, 42 y.o., female MRN: [id number] Patient Care Team    Relationship Specialty Notifications Start End  Natalia Leatherwood, DO PCP - General Family Medicine  10/03/17   Peterson Ao, MD Referring Physician Internal Medicine  10/04/17   Tobias Alexander, OD Referring Physician Optometry  10/04/17   Consuello Closs., MD Referring Physician Obstetrics and Gynecology  10/04/17     Chief Complaint  Patient presents with  . URI    congestion,cough x 2 days     Subjective: Pt presents for an OV with complaints of cough and congestion  of 2-3 days duration.  Associated symptoms include sore throat, fever, chills, fatigue, ear pain and sinus pain. She was seen at the urgent care about 10 days ago and was told it was her allergies and prescribed Astelin nasal spray. She reports she has been using that nasal spray, flonase, robitussin. She is getting worse the last 2-3 days.  Depression screen [facility] 2/9 10/31/2017 10/03/2017  Decreased Interest 0 0  Down, Depressed, Hopeless 0 0  PHQ - 2 Score 0 0  Altered sleeping 3 3  Tired, decreased energy 3 3  Change in appetite 1 1  Feeling bad or failure about yourself  0 0  Trouble concentrating 0 3  Moving slowly or fidgety/restless 0 0  Suicidal thoughts 0 0  PHQ-9 Score 7 10  Difficult doing work/chores - Not difficult at all    Allergies  Allergen Reactions  . Ativan [Lorazepam] Other (See Comments)    hyperactivity  . Penicillins Rash   Social History   Tobacco Use  . Smoking status: Never Smoker  . Smokeless tobacco: Never Used  Substance Use Topics  . Alcohol use: No   Past Medical History:  Diagnosis Date  . ADD (attention deficit disorder)   . Arthritis   . Chicken pox   . Chronic neck pain    HNP  . Depression   . Headache   . Heart murmur    as a baby  . Insomnia   . Nocturia   . Sleep apnea    CPAP-SLEEP STUDY DONE ABOUT 2 YRS AGO  . Thyroid disease   . UTI (urinary tract infection)     Past Surgical History:  Procedure Laterality Date  . ANTERIOR CERVICAL DECOMP/DISCECTOMY FUSION N/A 06/02/2014   Procedure: C6-7 Anterior Cervical Discectomy and Fusion, Allograft, Plate;  Surgeon: Eldred Manges, MD;  Location: [facility];  Service: Orthopedics;  Laterality: N/A;  . CESAREAN SECTION  2004/2007  . DILATION AND CURETTAGE OF UTERUS  2018  . wisdom teeth extracted      Family History  Problem Relation Age of Onset  . Arthritis Mother   . Depression Mother   . Drug abuse Mother   . Hyperlipidemia Mother   . Hypertension Mother   . Alcohol abuse Father   . Cancer Sister   . Cervical cancer Sister   . Early death Son   . Cancer Maternal Grandmother   . Breast cancer Maternal Grandmother   . Heart disease Paternal Grandmother   . Alcohol abuse Paternal Grandfather    Allergies as of 11/23/2017      Reactions   Ativan [lorazepam] Other (See Comments)   hyperactivity   Penicillins Rash      Medication List        Accurate as of 11/23/17 10:55 AM. Always use your most recent med list.  amphetamine-dextroamphetamine 15 MG tablet Commonly known as:  ADDERALL Take 1 tablet by mouth 2 (two) times daily.   azelastine 0.1 % nasal spray Commonly known as:  ASTELIN Place 1 spray into the nose daily.   lansoprazole 30 MG capsule Commonly known as:  PREVACID Take by mouth.   sertraline 100 MG tablet Commonly known as:  ZOLOFT Take 1 tablet (100 mg total) by mouth daily.       All past medical history, surgical history, allergies, family history, immunizations andmedications were updated in the EMR today and reviewed under the history and medication portions of their EMR.     ROS: Negative, with the exception of above mentioned in HPI   Objective:  BP 101/68 (BP Location: Right Arm, Patient Position: Sitting, Cuff Size: Large)   Pulse 90   Temp 98.6 F (37 C)   Resp 20   Ht 4\' 10"  (1.473 m)   Wt 210 lb (95.3 kg)   SpO2 97%   BMI 43.89 kg/m    Body mass index is 43.89 kg/m. Gen: Afebrile. No acute distress. Nontoxic in appearance, well developed, well nourished.  HENT: AT. [place]. Bilateral TM visualized right TM with fullness. MMM, no oral lesions. Bilateral nares with severe erythema, drainage and swelling. Throat without erythema or exudates. PND, cough and hoarseness present. TTP max sinus. Eyes:Pupils Equal Round Reactive to light, Extraocular movements intact,  Conjunctiva without redness, discharge or icterus. Neck/lymp/endocrine: Supple,no lymphadenopathy CV: RRR  Chest: CTAB, no wheeze or crackles. Good air movement, normal resp effort.  Abd: Soft. NTND. BS present.  Skin: no rashes, purpura or petechiae.  Neuro:  Normal gait. PERLA. EOMi. Alert. Oriented x3  No exam data present No results found. No results found for this or any previous visit (from the past 24 hour(s)).  Assessment/Plan: [name] is a 42 y.o. female present for OV for  Acute non-recurrent maxillary sinusitis Rest, hydrate.  + flonase, mucinex (DM if cough), nettie pot or nasal saline.  Doxy and prednisone prescribed, take until completed.  Start daily allergy med OTC If cough present it can last up to 6-8 weeks.  F/U 2 weeks of not improved.    Reviewed expectations re: course of current medical issues.  Discussed self-management of symptoms.  Outlined signs and symptoms indicating need for more acute intervention.  Patient verbalized understanding and all questions were answered.  Patient received an After-Visit Summary.    No orders of the defined types were placed in this encounter.    Note is dictated utilizing voice recognition software. Although note has been proof read prior to signing, occasional typographical errors still can be missed. If any questions arise, please do not hesitate to call for verification.   electronically signed by:  Felix Pacinienee Kuneff, DO  [facility] [place]

## 2017-12-18 ENCOUNTER — Ambulatory Visit
Admission: RE | Admit: 2017-12-18 | Discharge: 2017-12-18 | Disposition: A | Discharge status: Home / Self Care | Payer: Managed Care, Other (non HMO) | Source: Ambulatory Visit | Attending: Family Medicine | Admitting: Family Medicine

## 2017-12-18 DIAGNOSIS — Z1239 Encounter for other screening for malignant neoplasm of breast: Secondary | ICD-10-CM

## 2017-12-21 ENCOUNTER — Ambulatory Visit: Payer: Managed Care, Other (non HMO) | Admitting: Family Medicine

## 2017-12-21 ENCOUNTER — Encounter: Payer: Self-pay | Admitting: Family Medicine

## 2017-12-21 VITALS — BP 117/85 | HR 76 | Resp 16 | Ht <= 58 in | Wt 211.0 lb

## 2017-12-21 DIAGNOSIS — D582 Other hemoglobinopathies: Secondary | ICD-10-CM

## 2017-12-21 DIAGNOSIS — F909 Attention-deficit hyperactivity disorder, unspecified type: Secondary | ICD-10-CM

## 2017-12-21 DIAGNOSIS — G4733 Obstructive sleep apnea (adult) (pediatric): Secondary | ICD-10-CM | POA: Diagnosis not present

## 2017-12-21 DIAGNOSIS — G4730 Sleep apnea, unspecified: Secondary | ICD-10-CM | POA: Insufficient documentation

## 2017-12-21 DIAGNOSIS — R7989 Other specified abnormal findings of blood chemistry: Secondary | ICD-10-CM

## 2017-12-21 DIAGNOSIS — F320 Major depressive disorder, single episode, mild: Secondary | ICD-10-CM | POA: Diagnosis not present

## 2017-12-21 LAB — CBC
HCT: 44.3 % (ref 36.0–46.0)
HEMOGLOBIN: 14.9 g/dL (ref 12.0–15.0)
MCHC: 33.7 g/dL (ref 30.0–36.0)
MCV: 88 fl (ref 78.0–100.0)
Platelets: 340 10*3/uL (ref 150.0–400.0)
RBC: 5.04 Mil/uL (ref 3.87–5.11)
RDW: 14.7 % (ref 11.5–15.5)
WBC: 7.5 10*3/uL (ref 4.0–10.5)

## 2017-12-21 LAB — TSH: TSH: 3.45 u[IU]/mL (ref 0.35–4.50)

## 2017-12-21 MED ORDER — AMPHETAMINE-DEXTROAMPHETAMINE 15 MG PO TABS: 15.0000 mg | ORAL_TABLET | Freq: Two times a day (BID) | ORAL | 0 refills | Status: DC

## 2017-12-21 NOTE — Progress Notes (Signed)
[name] , [birth year], 42 y.o., female MRN: [id number] Patient Care Team    Relationship Specialty Notifications Start End  Natalia Leatherwood, DO PCP - General Family Medicine  10/03/17   Peterson Ao, MD Referring Physician Internal Medicine  10/04/17   Tobias Alexander, OD Referring Physician Optometry  10/04/17   Consuello Closs., MD Referring Physician Obstetrics and Gynecology  10/04/17     Chief Complaint  Patient presents with  . Follow-up    ADD     Subjective: [name] is a present today to followup on ADD/depression  Depression: Patient is prescribed Zoloft 100 mg daily.  She reports she has been on multiple different medications with either side effects or imminent failure with Cymbalta, Lexapro, Trintellex, Brintellix and Paxil.  She reports with the Zoloft she is doing well.   ADD:  Doing well on current doses of adderall 15 mg BID. She is experiencing some dry mouth symptoms.  Prior note:  She has a history of attention deficit disorder.  She has been tried on different medications over the course of time and secondary to insurance purposes she is back on Adderall 15 mg twice daily.  She reports she does well on this medication.  Elevated TSH/MOrbid Obesity tsh 3 months ago w/ mild elevation. Increased fatigue and feels her metabolism is not what it use to be.   Elevated hemoglobin (HCC)/OSA She does have sleep apnea and does not wear her CPAP.     Depression screen [facility] 2/9 10/31/2017 10/03/2017  Decreased Interest 0 0  Down, Depressed, Hopeless 0 0  PHQ - 2 Score 0 0  Altered sleeping 3 3  Tired, decreased energy 3 3  Change in appetite 1 1  Feeling bad or failure about yourself  0 0  Trouble concentrating 0 3  Moving slowly or fidgety/restless 0 0  Suicidal thoughts 0 0  PHQ-9 Score 7 10  Difficult doing work/chores - Not difficult at all    Allergies  Allergen Reactions  . Ativan [Lorazepam] Other (See Comments)   hyperactivity  . Penicillins Rash   Social History   Tobacco Use  . Smoking status: Never Smoker  . Smokeless tobacco: Never Used  Substance Use Topics  . Alcohol use: No   Past Medical History:  Diagnosis Date  . ADD (attention deficit disorder)   . Arthritis   . Chicken pox   . Chronic neck pain    HNP  . Depression   . Headache   . Heart murmur    as a baby  . Insomnia   . Nocturia   . Sleep apnea    CPAP-SLEEP STUDY DONE ABOUT 2 YRS AGO  . Thyroid disease   . UTI (urinary tract infection)    Past Surgical History:  Procedure Laterality Date  . ANTERIOR CERVICAL DECOMP/DISCECTOMY FUSION N/A 06/02/2014   Procedure: C6-7 Anterior Cervical Discectomy and Fusion, Allograft, Plate;  Surgeon: Eldred Manges, MD;  Location: [facility] OR;  Service: Orthopedics;  Laterality: N/A;  . CESAREAN SECTION  2004/2007  . DILATION AND CURETTAGE OF UTERUS  2018  . wisdom teeth extracted      Family History  Problem Relation Age of Onset  . Arthritis Mother   . Depression Mother   . Drug abuse Mother   . Hyperlipidemia Mother   . Hypertension Mother   . Alcohol abuse Father   . Cancer Sister   . Cervical cancer Sister   . Early death  Son   . Cancer Maternal Grandmother   . Breast cancer Maternal Grandmother   . Heart disease Paternal Grandmother   . Alcohol abuse Paternal Grandfather    Allergies as of 12/21/2017      Reactions   Ativan [lorazepam] Other (See Comments)   hyperactivity   Penicillins Rash      Medication List        Accurate as of 12/21/17  8:46 AM. Always use your most recent med list.          amphetamine-dextroamphetamine 15 MG tablet Commonly known as:  ADDERALL Take 1 tablet by mouth 2 (two) times daily.   azelastine 0.1 % nasal spray Commonly known as:  ASTELIN Place 1 spray into the nose daily.   lansoprazole 30 MG capsule Commonly known as:  PREVACID Take by mouth.   sertraline 100 MG tablet Commonly known as:  ZOLOFT Take 1 tablet (100 mg  total) by mouth daily.       All past medical history, surgical history, allergies, family history, immunizations andmedications were updated in the EMR today and reviewed under the history and medication portions of their EMR.     ROS: Negative, with the exception of above mentioned in HPI   Objective:  BP 117/85 (BP Location: Left Arm, Patient Position: Sitting, Cuff Size: Normal)   Pulse 76   Resp 16   Ht 4\' 10"  (1.473 m)   Wt 211 lb (95.7 kg)   SpO2 97%   BMI 44.10 kg/m  Body mass index is 44.1 kg/m. Gen: Afebrile. No acute distress. Obese, pleasant female  HENT: AT. [place].  MMM.  Eyes:Pupils Equal Round Reactive to light, Extraocular movements intact,  Conjunctiva without redness, discharge or icterus. Neck/lymp/endocrine: Supple,no lymphadenopathy, no thyromegaly CV: RRR no murmur, no edema, +2/4 P posterior tibialis pulses Chest: CTAB, no wheeze or crackles Abd: Soft. obese. NTND. BS present. no Masses palpated.  Skin: no rashes, purpura or petechiae.  Neuro:  Normal gait. PERLA. EOMi. Alert. Oriented.  Psych: Normal affect, dress and demeanor. Normal speech. Normal thought content and judgment..    No exam data present No results found. No results found for this or any previous visit (from the past 24 hour(s)).  Assessment/Plan: [name] is a 42 y.o. female present for OV for  Attention deficit hyperactivity disorder (ADHD), unspecified ADHD type -Patient is stable without side effects on Adderall 15 mg twice daily.  -Controlled substance contract signed.  [place] and appropriate 12/21/17.  - amphetamine-dextroamphetamine (ADDERALL) 15 MG tablet; Take 1 tablet by mouth 2 (two) times daily.  Dispense: 180 tablet; Refill: 0 -Follow-up in 3 months.  Depression, major, single episode, mild (HCC) stable.  Continue Zoloft 100 mg daily.  Refills provided today for her. Follow-up 3 months.  Morbid obesity  (HCC)/Elevated TSH - diet, exercise, rpt TSH - TSH  Elevated hemoglobin (HCC)/OSA - Encouraged to wear CPAP - CBC      Reviewed expectations re: course of current medical issues.  Discussed self-management of symptoms.  Outlined signs and symptoms indicating need for more acute intervention.  Patient verbalized understanding and all questions were answered.  Patient received an After-Visit Summary.    No orders of the defined types were placed in this encounter.  Greater than 45 minutes was spent with patient, greater than 50% of that time was spent face-to-face with patient counseling and/or coordinating care.    Note is dictated utilizing voice recognition software. Although note has  been proof read prior to signing, occasional typographical errors still can be missed. If any questions arise, please do not hesitate to call for verification.   electronically signed by:  Howard Pouch, DO  [place]

## 2017-12-21 NOTE — Patient Instructions (Signed)
It was nice to see you today. I have refilled your adderall today. Follow up on 3 months   Try to drink more water and use the biotin for dry mouth.    Please help us help you:  [facility] are [facility]. Below you will find basic instructions that you may need to access in the future. Please help us help you by reading the instructions, which cover many of the frequent questions we experience.   Prescription refills and request:  -In order to allow more efficient response time, please call your pharmacy for all refills. They will forward the request electronically to us. This allows for the quickest possible response. Request left on a nurse line can take longer to refill, since these are checked as time allows between office patients and other phone calls.  - refill request can take up to 3-5 working days to complete.  - If request is sent electronically and request is appropiate, it is usually completed in 1-2 business days.  - all patients will need to be seen routinely for all chronic medical conditions requiring prescription medications (see follow-up below). If you are overdue for follow up on your condition, you will be asked to make an appointment and we will call in enough medication to cover you until your appointment (up to 30 days).  - all controlled substances will require a face to face visit to request/refill.  - if you desire your prescriptions to go through a new pharmacy, and have an active script at original pharmacy, you will need to call your pharmacy and have scripts transferred to new pharmacy. This is completed between the pharmacy locations and not by your provider.    Results: If any images or labs were ordered, it can take up to 1 week to get results depending on the test ordered and the lab/facility running and resulting the test. - Normal or stable results, which do not need further discussion, may be released to your  mychart immediately with attached note to you. A call may not be generated for normal results. Please make certain to sign up for mychart. If you have questions on how to activate your mychart you can call the front office.  - If your results need further discussion, our office will attempt to contact you via phone, and if unable to reach you after 2 attempts, we will release your abnormal result to your mychart with instructions.  - All results will be automatically released in mychart after 1 week.  - Your provider will provide you with explanation and instruction on all relevant material in your results. Please keep in mind, results and labs may appear confusing or abnormal to the untrained eye, but it does not mean they are actually abnormal for you personally. If you have any questions about your results that are not covered, or you desire more detailed explanation than what was provided, you should make an appointment with your provider to do so.   Our office handles many outgoing and incoming calls daily. If we have not contacted you within 1 week about your results, please check your mychart to see if there is a message first and if not, then contact our office.  In helping with this matter, you help decrease call volume, and therefore allow us to be able to respond to patients needs more efficiently.   Acute office visits (sick visit):  An acute visit is intended for  a new problem and are scheduled in shorter time slots to allow schedule openings for patients with new problems. This is the appropriate visit to discuss a new problem. Problems will not be addressed by phone call or Echart message. Appointment is needed if requesting treatment. In order to provide you with excellent quality medical care with proper time for you to explain your problem, have an exam and receive treatment with instructions, these appointments should be limited to one new problem per visit. If you experience a new  problem, in which you desire to be addressed, please make an acute office visit, we save openings on the schedule to accommodate you. Please do not save your new problem for any other type of visit, let us take care of it properly and quickly for you.   Follow up visits:  Depending on your condition(s) your provider will need to see you routinely in order to provide you with quality care and prescribe medication(s). Most chronic conditions (Example: hypertension, Diabetes, depression/anxiety... etc), require visits a couple times a year. Your provider will instruct you on proper follow up for your personal medical conditions and history. Please make certain to make follow up appointments for your condition as instructed. Failing to do so could result in lapse in your medication treatment/refills. If you request a refill, and are overdue to be seen on a condition, we will always provide you with a 30 day script (once) to allow you time to schedule.    Medicare wellness (well visit): - we have a wonderful Nurse Maudie Mercury), that will meet with you and provide you will yearly medicare wellness visits. These visits should occur yearly (can not be scheduled less than 1 calendar year apart) and cover preventive health, immunizations, advance directives and screenings you are entitled to yearly through your medicare benefits. Do not miss out on your entitled benefits, this is when medicare will pay for these benefits to be ordered for you.  These are strongly encouraged by your provider and is the appropriate type of visit to make certain you are up to date with all preventive health benefits. If you have not had your medicare wellness exam in the last 12 months, please make certain to schedule one by calling the office and schedule your medicare wellness with Maudie Mercury as soon as possible.   Yearly physical (well visit):  - Adults are recommended to be seen yearly for physicals. Check with your insurance and date of your  last physical, most insurances require one calendar year between physicals. Physicals include all preventive health topics, screenings, medical exam and labs that are appropriate for gender/age and history. You may have fasting labs needed at this visit. This is a well visit (not a sick visit), new problems should not be covered during this visit (see acute visit).  - Pediatric patients are seen more frequently when they are younger. Your provider will advise you on well child visit timing that is appropriate for your their age. - This is not a medicare wellness visit. Medicare wellness exams do not have an exam portion to the visit. Some medicare companies allow for a physical, some do not allow a yearly physical. If your medicare allows a yearly physical you can schedule the medicare wellness with our nurse Maudie Mercury and have your physical with your provider after, on the same day. Please check with insurance for your full benefits.   Late Policy/No Shows:  - all new patients should arrive 15-30 minutes earlier than appointment to  allow [place] time  to  obtain all personal demographics,  insurance information and for you to complete office paperwork. - All established patients should arrive 10-15 minutes earlier than appointment time to update all information and be checked in .  - In our best efforts to run on time, if you are late for your appointment you will be asked to either reschedule or if able, we will work you back into the schedule. There will be a wait time to work you back in the schedule,  depending on availability.  - If you are unable to make it to your appointment as scheduled, please call 24 hours ahead of time to allow [place] to fill the time slot with someone else who needs to be seen. If you do not cancel your appointment ahead of time, you may be charged a no show fee.

## 2017-12-26 ENCOUNTER — Ambulatory Visit: Payer: Managed Care, Other (non HMO) | Admitting: Family Medicine

## 2018-01-05 ENCOUNTER — Encounter: Payer: Self-pay | Admitting: Family Medicine

## 2018-01-18 ENCOUNTER — Encounter (INDEPENDENT_AMBULATORY_CARE_PROVIDER_SITE_OTHER): Payer: Self-pay | Admitting: Surgery

## 2018-01-18 ENCOUNTER — Ambulatory Visit (INDEPENDENT_AMBULATORY_CARE_PROVIDER_SITE_OTHER): Payer: Managed Care, Other (non HMO) | Admitting: Surgery

## 2018-01-18 ENCOUNTER — Ambulatory Visit (INDEPENDENT_AMBULATORY_CARE_PROVIDER_SITE_OTHER): Payer: Managed Care, Other (non HMO)

## 2018-01-18 DIAGNOSIS — M25562 Pain in left knee: Secondary | ICD-10-CM | POA: Diagnosis not present

## 2018-01-18 NOTE — Progress Notes (Signed)
Office Visit Note   Patient: [name] SailorsKathryn L [name]           Date of Birth: 10/15/1975           MRN: [id number] Visit Date: 01/18/2018              Requested by: [name] [address] [place], [place] [id number] PCP: [name]   Assessment & Plan: Visit Diagnoses:  1. Acute pain of left knee   Possible symptomatic medial plica  Plan:  Today patient was given samples of Aleve to take 2 tablets twice daily with food. We will see how she does with this.  Follow with me in 4 weeks for recheck.  If knee is still bothersome I will plan intra-articular Marcaine/Depo-Medrol injection at that time.  If she is doing well and not have any problems she may cancel appointment.  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   Follow-Up Instructions: Return in about 4 weeks (around 02/15/2018) for With Fayrene FearingJames.   Orders:  Orders Placed This Encounter  Procedures  . XR Knee 1-2 Views Left   No orders of the defined types were placed in this encounter.     Procedures: No procedures performed   Clinical Data: No additional findings.   Subjective: Chief Complaint  Patient presents with  . Left Knee - Pain    HPI 42 year old white female comes into the office today with complaints of left knee pain.  States that pain started about 2 weeks ago.  No injury she can recall and no problems before onset.  Currently she states that it is difficult to localize the area of pain but has been having some medial aspect and also some pain into the proximal tibia area.  Intermittent feeling of knee stiffness.  She has used ibuprofen without any improvement.    Review of Systems No current cardiac pulmonary GI GU issues  Objective: Vital Signs: There were no vitals taken for this visit.  Physical Exam  Constitutional: No distress.  HENT:  Head: Normocephalic and atraumatic.  Eyes: Pupils are equal,  round, and reactive to light. EOM are normal.  Neck: Normal range of motion.  Pulmonary/Chest: No respiratory distress.  Musculoskeletal:  Gait is normal.  Negative logroll bilateral hips.  Negative straight leg raise.  Left knee shows good range of motion.  No swelling or palpable effusion.  Cruciate and collateral ligaments are stable.  Mildly tender medial plica.  Mild patellofemoral crepitus.  Mildly positive patellar grind.  Negative McMurray's test.  Calf nontender.  Neurovascular intact.  Neurological: She is alert.  Skin: Skin is warm.  Psychiatric: She has a normal mood and affect.    Ortho Exam  Specialty Comments:  No specialty comments available.  Imaging: No results found.   PMFS History: Patient Active Problem List   Diagnosis Date Noted  . Sleep apnea   . Elevated TSH 11/01/2017  . Elevated hemoglobin (HCC) 11/01/2017  . Morbid obesity (HCC) 10/31/2017  . Attention deficit hyperactivity disorder (ADHD) 10/03/2017  . Depression, major, single episode, mild (HCC) 10/03/2017  . S/P cervical spinal fusion 06/02/2014   Past Medical History:  Diagnosis Date  . ADD (attention deficit disorder)   . Arthritis   . Chicken pox   . Chronic neck pain    HNP  . Depression   . Headache   . Heart  murmur    as a baby  . Insomnia   . Nocturia   . Sleep apnea    CPAP-SLEEP STUDY DONE ABOUT 2 YRS AGO  . Thyroid disease   . UTI (urinary tract infection)     Family History  Problem Relation Age of Onset  . Arthritis Mother   . Depression Mother   . Drug abuse Mother   . Hyperlipidemia Mother   . Hypertension Mother   . Alcohol abuse Father   . Cancer Sister   . Cervical cancer Sister   . Early death Son   . Cancer Maternal Grandmother   . Breast cancer Maternal Grandmother   . Heart disease Paternal Grandmother   . Alcohol abuse Paternal Grandfather     Past Surgical History:  Procedure Laterality Date  . ANTERIOR CERVICAL DECOMP/DISCECTOMY FUSION N/A  06/02/2014   Procedure: C6-7 Anterior Cervical Discectomy and Fusion, Allograft, Plate;  Surgeon: Eldred Manges, MD;  Location: [facility] OR;  Service: Orthopedics;  Laterality: N/A;  . CESAREAN SECTION  2004/2007  . DILATION AND CURETTAGE OF UTERUS  2018  . wisdom teeth extracted      Social History   Occupational History  . Occupation: Associate Professor  Tobacco Use  . Smoking status: Never Smoker  . Smokeless tobacco: Never Used  Substance and Sexual Activity  . Alcohol use: No  . Drug use: No  . Sexual activity: Yes    Partners: Male    Birth control/protection: Pill

## 2018-01-25 ENCOUNTER — Encounter: Payer: Self-pay | Admitting: Family Medicine

## 2018-01-31 ENCOUNTER — Ambulatory Visit: Payer: Managed Care, Other (non HMO) | Admitting: Family Medicine

## 2018-01-31 ENCOUNTER — Encounter: Payer: Self-pay | Admitting: Family Medicine

## 2018-01-31 VITALS — BP 112/77 | HR 89 | Resp 16 | Ht <= 58 in | Wt 211.0 lb

## 2018-01-31 DIAGNOSIS — F320 Major depressive disorder, single episode, mild: Secondary | ICD-10-CM | POA: Diagnosis not present

## 2018-01-31 MED ORDER — SERTRALINE HCL 100 MG PO TABS: 200.0000 mg | ORAL_TABLET | Freq: Every day | ORAL | 0 refills | Status: DC

## 2018-01-31 NOTE — Progress Notes (Signed)
[name] , 12/18/1975, 42 y.o., female MRN: [id number] Patient Care Team    Relationship Specialty Notifications Start End  Natalia LeatherwoodKuneff, Kel Senn A, DO PCP - General Family Medicine  10/03/17   Peterson AoMekala, Kavya C, MD Referring Physician Internal Medicine  10/04/17   Tobias AlexanderPalmer, Staci, OD Referring Physician Optometry  10/04/17   Consuello ClossGerancher, Karen R., MD Referring Physician Obstetrics and Gynecology  10/04/17     Chief Complaint  Patient presents with  . Depression    worsening     Subjective: [name] is a present today to followup on depression  Depression: Patient is prescribed Zoloft 100 mg daily.  She reports she has been on multiple different medications with either side effects or imminent failure with Cymbalta, Lexapro, Trintellex, Brintellix, vybrid (nightmares) and Paxil.  She is having more depression/anxiety symptoms over the last month.  She reports her work is very hectic right now and is causing more anxiety.  She has noticed she just wants to come home and go to sleep.  She is not motivated.  She is feeling more tearful.  [facility] 2/9 01/31/2018 10/31/2017 10/03/2017  Decreased Interest 2 0 0  Down, Depressed, Hopeless 2 0 0  PHQ - 2 Score 4 0 0  Altered sleeping 2 3 3   Tired, decreased energy 3 3 3   Change in appetite 3 1 1   Feeling bad or failure about yourself  1 0 0  Trouble concentrating 1 0 3  Moving slowly or fidgety/restless 1 0 0  Suicidal thoughts 0 0 0  PHQ-9 Score 15 7 10   Difficult doing work/chores Somewhat difficult - Not difficult at all   GAD 7 : Generalized Anxiety Score 01/31/2018 10/03/2017  Nervous, Anxious, on Edge 2 1  Control/stop worrying 2 1  Worry too much - different things 3 1  Trouble relaxing 3 2  Restless 1 1  Easily annoyed or irritable 3 0  Afraid - awful might happen 1 0  Total GAD 7 Score 15 6  Anxiety Difficulty Somewhat difficult Not difficult at all    Allergies  Allergen Reactions  . Ativan  [Lorazepam] Other (See Comments)    hyperactivity  . Penicillins Rash   Social History   Tobacco Use  . Smoking status: Never Smoker  . Smokeless tobacco: Never Used  Substance Use Topics  . Alcohol use: No   Past Medical History:  Diagnosis Date  . ADD (attention deficit disorder)   . Arthritis   . Chicken pox   . Chronic neck pain    HNP  . Depression   . Headache   . Heart murmur    as a baby  . Insomnia   . Nocturia   . Sleep apnea    CPAP-SLEEP STUDY DONE ABOUT 2 YRS AGO  . Thyroid disease   . UTI (urinary tract infection)    Past Surgical History:  Procedure Laterality Date  . ANTERIOR CERVICAL DECOMP/DISCECTOMY FUSION N/A 06/02/2014   Procedure: C6-7 Anterior Cervical Discectomy and Fusion, Allograft, Plate;  Surgeon: Eldred MangesMark C Yates, MD;  Location: [facility] OR;  Service: Orthopedics;  Laterality: N/A;  . CESAREAN SECTION  2004/2007  . DILATION AND CURETTAGE OF UTERUS  2018  . wisdom teeth extracted      Family History  Problem Relation Age of Onset  . Arthritis Mother   . Depression Mother   . Drug abuse Mother   . Hyperlipidemia Mother   . Hypertension Mother   . Alcohol  abuse Father   . Cancer Sister   . Cervical cancer Sister   . Early death Son   . Cancer Maternal Grandmother   . Breast cancer Maternal Grandmother   . Heart disease Paternal Grandmother   . Alcohol abuse Paternal Grandfather    Allergies as of 01/31/2018      Reactions   Ativan [lorazepam] Other (See Comments)   hyperactivity   Penicillins Rash      Medication List        Accurate as of 01/31/18  9:48 AM. Always use your most recent med list.          amphetamine-dextroamphetamine 15 MG tablet Commonly known as:  ADDERALL Take 1 tablet by mouth 2 (two) times daily.   azelastine 0.1 % nasal spray Commonly known as:  ASTELIN Place 1 spray into the nose daily.   lansoprazole 30 MG capsule Commonly known as:  PREVACID Take by mouth.   sertraline 100 MG tablet Commonly  known as:  ZOLOFT Take 1 tablet (100 mg total) by mouth daily.       All past medical history, surgical history, allergies, family history, immunizations andmedications were updated in the EMR today and reviewed under the history and medication portions of their EMR.     ROS: Negative, with the exception of above mentioned in HPI   Objective:  BP 112/77 (BP Location: Right Arm, Patient Position: Sitting, Cuff Size: Normal)   Pulse 89   Resp 16   Ht 4\' 10"  (1.473 m)   Wt 211 lb (95.7 kg)   SpO2 98%   BMI 44.10 kg/m  Body mass index is 44.1 kg/m. Gen: Afebrile. No acute distress.  Nontoxic in appearance, pleasant Caucasian female. HENT: AT. [place].  MMM.  Eyes:Pupils Equal Round Reactive to light, Extraocular movements intact,  Conjunctiva without redness, discharge or icterus. CV: RRR  Chest: CTAB, no wheeze or crackles Neuro:  Normal gait. PERLA. EOMi. Alert. Oriented x3 Psych: Tearful at times, otherwise normal affect, dress and demeanor. Normal speech. Normal thought content and judgment.    No exam data present No results found. No results found for this or any previous visit (from the past 24 hour(s)).  Assessment/Plan: [name] is a 42 y.o. female present for OV for  Depression, major, single episode, mild (HCC) Worsening.  Increase Zoloft by tapering to 200 mg daily.  Refills provided today for higher dose. Follow-up at regular scheduled appointment in October, sooner if needed.   Reviewed expectations re: course of current medical issues.  Discussed self-management of symptoms.  Outlined signs and symptoms indicating need for more acute intervention.  Patient verbalized understanding and all questions were answered.  Patient received an After-Visit Summary.    No orders of the defined types were placed in this encounter.   Note is dictated utilizing voice recognition software. Although note has been proof read prior to signing, occasional  typographical errors still can be missed. If any questions arise, please do not hesitate to call for verification.   electronically signed by:  Felix Pacinienee Ademide Schaberg, DO  [facility] - [place]

## 2018-01-31 NOTE — Patient Instructions (Signed)
Increase zoloft to 200 mg a day. Start by taking 1.5 tabs for 5 days, then 2 pills daily until you see me.   Keep your upcoming appt in October for follow up.    Major Depressive Disorder, Adult Major depressive disorder (MDD) is a mental health condition. MDD often makes you feel sad, hopeless, or helpless. MDD can also cause symptoms in your body. MDD can affect your:  Work.  School.  Relationships.  Other normal activities.  MDD can range from mild to very bad. It may occur once (single episode MDD). It can also occur many times (recurrent MDD). The main symptoms of MDD often include:  Feeling sad, depressed, or irritable most of the time.  Loss of interest.  MDD symptoms also include:  Sleeping too much or too little.  Eating too much or too little.  A change in your weight.  Feeling tired (fatigue) or having low energy.  Feeling worthless.  Feeling guilty.  Trouble making decisions.  Trouble thinking clearly.  Thoughts of suicide or harming others.  Feeling weak.  Feeling agitated.  Keeping yourself from being around other people (isolation).  Follow these instructions at home: Activity  Do these things as told by your doctor: ? Go back to your normal activities. ? Exercise regularly. ? Spend time outdoors. Alcohol  Talk with your doctor about how alcohol can affect your antidepressant medicines.  Do not drink alcohol. Or, limit how much alcohol you drink. ? This means no more than 1 drink a day for nonpregnant women and 2 drinks a day for men. One drink equals one of these:  12 oz of beer.  5 oz of wine.  1 oz of hard liquor. General instructions  Take over-the-counter and prescription medicines only as told by your doctor.  Eat a healthy diet.  Get plenty of sleep.  Find activities that you enjoy. Make time to do them.  Think about joining a support group. Your doctor may be able to suggest a group for you.  Keep all follow-up  visits as told by your doctor. This is important. Where to find more information:  The [employer] on Mental Illness: ? www.nami.org  [employer] of Mental Health: ? http://www.maynard.net/www.nimh.nih.gov  National Suicide Prevention Lifeline: ? (209)497-58451-628-718-0061. This is free, 24-hour help. Contact a doctor if:  Your symptoms get worse.  You have new symptoms. Get help right away if:  You self-harm.  You see, hear, taste, smell, or feel things that are not present (hallucinate). If you ever feel like you may hurt yourself or others, or have thoughts about taking your own life, get help right away. You can go to your nearest emergency department or call:  Your local emergency services (911 in the [place]  A suicide crisis helpline, such as the National Suicide Prevention Lifeline: ? (306)544-21891-628-718-0061. This is open 24 hours a day.  This information is not intended to replace advice given to you by your health care provider. Make sure you discuss any questions you have with your health care provider. Document Released: 05/11/2015 Document Revised: 02/14/2016 Document Reviewed: 02/14/2016 Elsevier Interactive Patient Education  2017 [employer]

## 2018-02-15 ENCOUNTER — Ambulatory Visit (INDEPENDENT_AMBULATORY_CARE_PROVIDER_SITE_OTHER): Payer: Managed Care, Other (non HMO) | Admitting: Surgery

## 2018-03-13 ENCOUNTER — Ambulatory Visit: Payer: Managed Care, Other (non HMO) | Admitting: Family Medicine

## 2018-03-13 ENCOUNTER — Encounter: Payer: Self-pay | Admitting: Family Medicine

## 2018-03-13 VITALS — BP 102/74 | HR 75 | Temp 98.1°F | Resp 20 | Ht <= 58 in | Wt 213.2 lb

## 2018-03-13 DIAGNOSIS — K130 Diseases of lips: Secondary | ICD-10-CM | POA: Diagnosis not present

## 2018-03-13 HISTORY — DX: Diseases of lips: K13.0

## 2018-03-13 MED ORDER — MUPIROCIN 2 % EX OINT: 1.0000 "application " | TOPICAL_OINTMENT | Freq: Two times a day (BID) | CUTANEOUS | 0 refills | Status: DC

## 2018-03-13 NOTE — Patient Instructions (Signed)
Apply ointment twice a day for 7-14 days.  Once gone, use small amount of bag balm to area daily to keep infection away and condition of lips better.   You can ice your lip and/or use advil. If icing, lace in a towel for 10 minutes at a time only. You can do that a few times a day.

## 2018-03-13 NOTE — Progress Notes (Signed)
[name] , 03/04/1976, 42 y.o., female MRN: [id number] Patient Care Team    Relationship Specialty Notifications Start End  Natalia Leatherwood, DO PCP - General Family Medicine  10/03/17   Peterson Ao, MD Referring Physician Internal Medicine  10/04/17   Tobias Alexander, OD Referring Physician Optometry  10/04/17   Consuello Closs., MD Referring Physician Obstetrics and Gynecology  10/04/17     Chief Complaint  Patient presents with  . Mouth Lesions    bottom lip      Subjective: Pt presents for an OV with complaints of lip swelling and rash of 1 week duration.  Associated symptoms include she reports she frequently gets cracked left lip commissure and occasional "little white bump" in this area. She endorses squeezing her lip, attempting to pop the little white bump and swelling occurred after.   Depression screen [facility] 2/9 01/31/2018 10/31/2017 10/03/2017  Decreased Interest 2 0 0  Down, Depressed, Hopeless 2 0 0  PHQ - 2 Score 4 0 0  Altered sleeping 2 3 3   Tired, decreased energy 3 3 3   Change in appetite 3 1 1   Feeling bad or failure about yourself  1 0 0  Trouble concentrating 1 0 3  Moving slowly or fidgety/restless 1 0 0  Suicidal thoughts 0 0 0  PHQ-9 Score 15 7 10   Difficult doing work/chores Somewhat difficult - Not difficult at all    Allergies  Allergen Reactions  . Ativan [Lorazepam] Other (See Comments)    hyperactivity  . Penicillins Rash   Social History   Tobacco Use  . Smoking status: Never Smoker  . Smokeless tobacco: Never Used  Substance Use Topics  . Alcohol use: No   Past Medical History:  Diagnosis Date  . ADD (attention deficit disorder)   . Arthritis   . Chicken pox   . Chronic neck pain    HNP  . Depression   . Headache   . Heart murmur    as a baby  . Insomnia   . Nocturia   . Sleep apnea    CPAP-SLEEP STUDY DONE ABOUT 2 YRS AGO  . Thyroid disease   . UTI (urinary tract infection)    Past Surgical History:    Procedure Laterality Date  . ANTERIOR CERVICAL DECOMP/DISCECTOMY FUSION N/A 06/02/2014   Procedure: C6-7 Anterior Cervical Discectomy and Fusion, Allograft, Plate;  Surgeon: Eldred Manges, MD;  Location: [facility] OR;  Service: Orthopedics;  Laterality: N/A;  . CESAREAN SECTION  2004/2007  . DILATION AND CURETTAGE OF UTERUS  2018  . wisdom teeth extracted      Family History  Problem Relation Age of Onset  . Arthritis Mother   . Depression Mother   . Drug abuse Mother   . Hyperlipidemia Mother   . Hypertension Mother   . Alcohol abuse Father   . Cancer Sister   . Cervical cancer Sister   . Early death Son   . Cancer Maternal Grandmother   . Breast cancer Maternal Grandmother   . Heart disease Paternal Grandmother   . Alcohol abuse Paternal Grandfather    Allergies as of 03/13/2018      Reactions   Ativan [lorazepam] Other (See Comments)   hyperactivity   Penicillins Rash      Medication List        Accurate as of 03/13/18  2:15 PM. Always use your most recent med list.  amphetamine-dextroamphetamine 15 MG tablet Commonly known as:  ADDERALL Take 1 tablet by mouth 2 (two) times daily.   azelastine 0.1 % nasal spray Commonly known as:  ASTELIN Place 1 spray into the nose daily.   lansoprazole 30 MG capsule Commonly known as:  PREVACID Take by mouth.   sertraline 100 MG tablet Commonly known as:  ZOLOFT Take 2 tablets (200 mg total) by mouth daily.       All past medical history, surgical history, allergies, family history, immunizations andmedications were updated in the EMR today and reviewed under the history and medication portions of their EMR.     ROS: Negative, with the exception of above mentioned in HPI   Objective:  BP 102/74 (BP Location: Right Arm, Patient Position: Sitting, Cuff Size: Large)   Pulse 75   Temp 98.1 F (36.7 C)   Resp 20   Ht 4\' 10"  (1.473 m)   Wt 213 lb 4 oz (96.7 kg)   SpO2 98%   BMI 44.57 kg/m  Body mass index is  44.57 kg/m. Gen: Afebrile. No acute distress. Nontoxic in appearance, well developed, well nourished.  HENT: AT. [place].MMM, left lower lip swelling present, cracking left lip commissure and scab formation below left lower lip.  Eyes:Pupils Equal Round Reactive to light, Extraocular movements intact,  Conjunctiva without redness, discharge or icterus. Neck/lymp/endocrine: Supple,no lymphadenopathy  No exam data present No results found. No results found for this or any previous visit (from the past 24 hour(s)).  Assessment/Plan: ELIANNY [name] is a 42 y.o. female present for OV for  Angular cheilitis Appears condition started with angular cheilitis that is intermittent for her. However she was attempting to squeeze her lip to expel small pimple and she has irritated area-- > causing mild swelling left lower lip.  - NSAIDS/ICE discussed.  - treat with Bactroban ointment 7-14 days BID. - Use treatment such as bag balm after infection is cleared.  - Consider oral doxy if not improving (advised her she can call in and would call in the doxy for her- if worsening in 1 week.) - PRN   Reviewed expectations re: course of current medical issues.  Discussed self-management of symptoms.  Outlined signs and symptoms indicating need for more acute intervention.  Patient verbalized understanding and all questions were answered.  Patient received an After-Visit Summary.    No orders of the defined types were placed in this encounter.    Note is dictated utilizing voice recognition software. Although note has been proof read prior to signing, occasional typographical errors still can be missed. If any questions arise, please do not hesitate to call for verification.   electronically signed by:  Felix Pacini, DO  [facility] - [place]

## 2018-03-14 ENCOUNTER — Encounter: Payer: Self-pay | Admitting: Family Medicine

## 2018-03-15 ENCOUNTER — Ambulatory Visit: Payer: Self-pay

## 2018-03-15 ENCOUNTER — Telehealth: Payer: Self-pay | Admitting: Family Medicine

## 2018-03-15 MED ORDER — DOXYCYCLINE HYCLATE 100 MG PO TABS: 100.0000 mg | ORAL_TABLET | Freq: Two times a day (BID) | ORAL | 0 refills | Status: DC

## 2018-03-15 NOTE — Telephone Encounter (Signed)
Rx for Doxy sent to patients requested pharmacy as directed

## 2018-03-15 NOTE — Telephone Encounter (Signed)
Pt. called to report worsening of lower lip since evaluated on 10/1; stated it has doubled in size.  Reported the open sore, on the left corner of her mouth, is less painful; drained pus x1.  Reported the area beneath the lower lip is red, tender, and swollen, and the swelling extends down into the chin area.  Denied fever/ chills.  Denied any swelling of her tongue or throat.  Has been applying the ointment and cold compresses as advised.  Voiced concern of poss. infection.  Advised will send message to Dr. Claiborne Billings for further recommendation.  Pt. Agrees with plan.     Reason for Disposition . [1] Looks infected AND [2] large red area (> 2 in. or 5 cm)    Stated worsening swelling of lower lip with increased redness and tenderness beneath the lower lip. Left corner of lip drained pus x 1; described "white splotches" inside the lower lip.  Answer Assessment - Initial Assessment Questions 1. ONSET: "When did the swelling start?" (e.g., minutes, hours, days)     Swelling about one week; worse in past 2 days 2. SEVERITY: "How swollen is it?"     It has doubled in size since seen in office 2 days ago 3. ITCHING: "Is there any itching?" If so, ask: "How much?"   (Scale 1-10; mild, moderate or severe)     denied 4. PAIN: "Is the swelling painful to touch?" If so, ask: "How painful is it?"   (Scale 1-10; mild, moderate or severe)     Very painful 5. CAUSE: "What do you think is causing the lip swelling?"     "I think it is infected."  6. RECURRENT SYMPTOM: "Have you had lip swelling before?" If so, ask: "When was the last time?" "What happened that time?"     Evaluated 2 days ago; applying Bactroban Oint. And cold compresses 7. OTHER SYMPTOMS: "Do you have any other symptoms?" (e.g., toothache)    Reported "white splotchy areas inside lower lip", redness, tenderness below lip and puffiness extending into the chin area.   8. PREGNANCY: "Is there any chance you are pregnant?" "When was your last menstrual  period?"     Did not ask  Protocols used: LIP [facility]

## 2018-03-15 NOTE — Telephone Encounter (Signed)
Please call pt: - per echart request, I will call in doxycyline BID for 10 days. Please ask her the pharmacy (2 in system) and call med. thanks.

## 2018-03-27 ENCOUNTER — Encounter: Payer: Self-pay | Admitting: Family Medicine

## 2018-03-27 ENCOUNTER — Ambulatory Visit: Payer: Managed Care, Other (non HMO) | Admitting: Family Medicine

## 2018-03-27 VITALS — BP 104/72 | HR 73 | Temp 98.1°F | Resp 20 | Ht <= 58 in | Wt 210.0 lb

## 2018-03-27 DIAGNOSIS — F909 Attention-deficit hyperactivity disorder, unspecified type: Secondary | ICD-10-CM

## 2018-03-27 DIAGNOSIS — F320 Major depressive disorder, single episode, mild: Secondary | ICD-10-CM

## 2018-03-27 MED ORDER — SERTRALINE HCL 100 MG PO TABS: 200.0000 mg | ORAL_TABLET | Freq: Every day | ORAL | 1 refills | Status: DC

## 2018-03-27 MED ORDER — AMPHETAMINE-DEXTROAMPHETAMINE 15 MG PO TABS: 15.0000 mg | ORAL_TABLET | Freq: Two times a day (BID) | ORAL | 0 refills | Status: DC

## 2018-03-27 NOTE — Patient Instructions (Addendum)
I have refilled your meds for you for 6 mos.  Follow up before April 15th.  I ma glad you are doing well.   Please help [place] help you:  We are honored you have chosen [facility] for your Primary Care home. Below you will find basic instructions that you may need to access in the future. Please help [place] help you by reading the instructions, which cover many of the frequent questions we experience.   Prescription refills and request:  -In order to allow more efficient response time, please call your pharmacy for all refills. They will forward the request electronically to [place]. This allows for the quickest possible response. Request left on a nurse line can take longer to refill, since these are checked as time allows between office patients and other phone calls.  - refill request can take up to 3-5 working days to complete.  - If request is sent electronically and request is appropiate, it is usually completed in 1-2 business days.  - all patients will need to be seen routinely for all chronic medical conditions requiring prescription medications (see follow-up below). If you are overdue for follow up on your condition, you will be asked to make an appointment and we will call in enough medication to cover you until your appointment (up to 30 days).  - all controlled substances will require a face to face visit to request/refill.  - if you desire your prescriptions to go through a new pharmacy, and have an active script at original pharmacy, you will need to call your pharmacy and have scripts transferred to new pharmacy. This is completed between the pharmacy locations and not by your provider.    Results: If any images or labs were ordered, it can take up to 1 week to get results depending on the test ordered and the lab/facility running and resulting the test. - Normal or stable results, which do not need further discussion, may be released to your mychart immediately with attached note to you.  A call may not be generated for normal results. Please make certain to sign up for mychart. If you have questions on how to activate your mychart you can call the front office.  - If your results need further discussion, our office will attempt to contact you via phone, and if unable to reach you after 2 attempts, we will release your abnormal result to your mychart with instructions.  - All results will be automatically released in mychart after 1 week.  - Your provider will provide you with explanation and instruction on all relevant material in your results. Please keep in mind, results and labs may appear confusing or abnormal to the untrained eye, but it does not mean they are actually abnormal for you personally. If you have any questions about your results that are not covered, or you desire more detailed explanation than what was provided, you should make an appointment with your provider to do so.   Our office handles many outgoing and incoming calls daily. If we have not contacted you within 1 week about your results, please check your mychart to see if there is a message first and if not, then contact our office.  In helping with this matter, you help decrease call volume, and therefore allow [place] to be able to respond to patients needs more efficiently.   Acute office visits (sick visit):  An acute visit is intended for a new problem and are scheduled in shorter time  slots to allow schedule openings for patients with new problems. This is the appropriate visit to discuss a new problem. Problems will not be addressed by phone call or Echart message. Appointment is needed if requesting treatment. In order to provide you with excellent quality medical care with proper time for you to explain your problem, have an exam and receive treatment with instructions, these appointments should be limited to one new problem per visit. If you experience a new problem, in which you desire to be addressed, please  make an acute office visit, we save openings on the schedule to accommodate you. Please do not save your new problem for any other type of visit, let us take care of it properly and quickly for you.   Follow up visits:  Depending on your condition(s) your provider will need to see you routinely in order to provide you with quality care and prescribe medication(s). Most chronic conditions (Example: hypertension, Diabetes, depression/anxiety... etc), require visits a couple times a year. Your provider will instruct you on proper follow up for your personal medical conditions and history. Please make certain to make follow up appointments for your condition as instructed. Failing to do so could result in lapse in your medication treatment/refills. If you request a refill, and are overdue to be seen on a condition, we will always provide you with a 30 day script (once) to allow you time to schedule.    Medicare wellness (well visit): - we have a wonderful Nurse Maudie Mercury), that will meet with you and provide you will yearly medicare wellness visits. These visits should occur yearly (can not be scheduled less than 1 calendar year apart) and cover preventive health, immunizations, advance directives and screenings you are entitled to yearly through your medicare benefits. Do not miss out on your entitled benefits, this is when medicare will pay for these benefits to be ordered for you.  These are strongly encouraged by your provider and is the appropriate type of visit to make certain you are up to date with all preventive health benefits. If you have not had your medicare wellness exam in the last 12 months, please make certain to schedule one by calling the office and schedule your medicare wellness with Maudie Mercury as soon as possible.   Yearly physical (well visit):  - Adults are recommended to be seen yearly for physicals. Check with your insurance and date of your last physical, most insurances require one calendar  year between physicals. Physicals include all preventive health topics, screenings, medical exam and labs that are appropriate for gender/age and history. You may have fasting labs needed at this visit. This is a well visit (not a sick visit), new problems should not be covered during this visit (see acute visit).  - Pediatric patients are seen more frequently when they are younger. Your provider will advise you on well child visit timing that is appropriate for your their age. - This is not a medicare wellness visit. Medicare wellness exams do not have an exam portion to the visit. Some medicare companies allow for a physical, some do not allow a yearly physical. If your medicare allows a yearly physical you can schedule the medicare wellness with our nurse Maudie Mercury and have your physical with your provider after, on the same day. Please check with insurance for your full benefits.   Late Policy/No Shows:  - all new patients should arrive 15-30 minutes earlier than appointment to allow [place] time  to  obtain all personal  demographics,  insurance information and for you to complete office paperwork. - All established patients should arrive 10-15 minutes earlier than appointment time to update all information and be checked in .  - In our best efforts to run on time, if you are late for your appointment you will be asked to either reschedule or if able, we will work you back into the schedule. There will be a wait time to work you back in the schedule,  depending on availability.  - If you are unable to make it to your appointment as scheduled, please call 24 hours ahead of time to allow [place] to fill the time slot with someone else who needs to be seen. If you do not cancel your appointment ahead of time, you may be charged a no show fee.

## 2018-03-27 NOTE — Progress Notes (Signed)
[name] , Oct 14, 1975, 42 y.o., female MRN: [id number] Patient Care Team    Relationship Specialty Notifications Start End  Natalia Leatherwood, DO PCP - General Family Medicine  10/03/17   Peterson Ao, MD Referring Physician Internal Medicine  10/04/17   Tobias Alexander, OD Referring Physician Optometry  10/04/17   Consuello Closs., MD Referring Physician Obstetrics and Gynecology  10/04/17     Chief Complaint  Patient presents with  . ADHD  . Depression     Subjective: [name] is a present today to followup on depression  Depression/ADD: Patient is prescribed Zoloft 200 mg daily.  She is doing well on this dose, recently increased.  Her ADD is well controlled on adderall 15 mg BID. She reports she has been on multiple different medications with either side effects or imminent failure with Cymbalta, Lexapro, Trintellex, Brintellix, vybrid (nightmares) and Paxil.    Depression screen [facility] 2/9 03/27/2018 01/31/2018 10/31/2017 10/03/2017  Decreased Interest 0 2 0 0  Down, Depressed, Hopeless 0 2 0 0  PHQ - 2 Score 0 4 0 0  Altered sleeping 0 2 3 3   Tired, decreased energy 0 3 3 3   Change in appetite 0 3 1 1   Feeling bad or failure about yourself  0 1 0 0  Trouble concentrating 0 1 0 3  Moving slowly or fidgety/restless 0 1 0 0  Suicidal thoughts 0 0 0 0  PHQ-9 Score 0 15 7 10   Difficult doing work/chores Not difficult at all Somewhat difficult - Not difficult at all   GAD 7 : Generalized Anxiety Score 01/31/2018 10/03/2017  Nervous, Anxious, on Edge 2 1  Control/stop worrying 2 1  Worry too much - different things 3 1  Trouble relaxing 3 2  Restless 1 1  Easily annoyed or irritable 3 0  Afraid - awful might happen 1 0  Total GAD 7 Score 15 6  Anxiety Difficulty Somewhat difficult Not difficult at all    Allergies  Allergen Reactions  . Ativan [Lorazepam] Other (See Comments)    hyperactivity  . Penicillins Rash   Social History   Tobacco Use    . Smoking status: Never Smoker  . Smokeless tobacco: Never Used  Substance Use Topics  . Alcohol use: No   Past Medical History:  Diagnosis Date  . ADD (attention deficit disorder)   . Arthritis   . Chicken pox   . Chronic neck pain    HNP  . Depression   . Headache   . Heart murmur    as a baby  . Insomnia   . Nocturia   . Sleep apnea    CPAP-SLEEP STUDY DONE ABOUT 2 YRS AGO  . Thyroid disease   . UTI (urinary tract infection)    Past Surgical History:  Procedure Laterality Date  . ANTERIOR CERVICAL DECOMP/DISCECTOMY FUSION N/A 06/02/2014   Procedure: C6-7 Anterior Cervical Discectomy and Fusion, Allograft, Plate;  Surgeon: Eldred Manges, MD;  Location: [facility] OR;  Service: Orthopedics;  Laterality: N/A;  . CESAREAN SECTION  2004/2007  . DILATION AND CURETTAGE OF UTERUS  2018  . wisdom teeth extracted      Family History  Problem Relation Age of Onset  . Arthritis Mother   . Depression Mother   . Drug abuse Mother   . Hyperlipidemia Mother   . Hypertension Mother   . Alcohol abuse Father   . Cancer Sister   . Cervical cancer Sister   .  Early death Son   . Cancer Maternal Grandmother   . Breast cancer Maternal Grandmother   . Heart disease Paternal Grandmother   . Alcohol abuse Paternal Grandfather    Allergies as of 03/27/2018      Reactions   Ativan [lorazepam] Other (See Comments)   hyperactivity   Penicillins Rash      Medication List        Accurate as of 03/27/18  9:46 AM. Always use your most recent med list.          amphetamine-dextroamphetamine 15 MG tablet Commonly known as:  ADDERALL Take 1 tablet by mouth 2 (two) times daily.   azelastine 0.1 % nasal spray Commonly known as:  ASTELIN Place 1 spray into the nose daily.   lansoprazole 30 MG capsule Commonly known as:  PREVACID Take by mouth.   mupirocin ointment 2 % Commonly known as:  BACTROBAN Place 1 application into the nose 2 (two) times daily.   sertraline 100 MG  tablet Commonly known as:  ZOLOFT Take 2 tablets (200 mg total) by mouth daily.       All past medical history, surgical history, allergies, family history, immunizations andmedications were updated in the EMR today and reviewed under the history and medication portions of their EMR.     ROS: Negative, with the exception of above mentioned in HPI   Objective:  BP 104/72 (BP Location: Left Arm, Patient Position: Sitting, Cuff Size: Large)   Pulse 73   Temp 98.1 F (36.7 C)   Resp 20   Ht 4\' 10"  (1.473 m)   Wt 210 lb (95.3 kg)   SpO2 97%   BMI 43.89 kg/m  Body mass index is 43.89 kg/m. \Gen: Afebrile. No acute distress. Nontoxic.  HENT: AT. [place]. MMM Eyes:Pupils Equal Round Reactive to light, Extraocular movements intact,  Conjunctiva without redness, discharge or icterus. CV: RRR  Chest: CTAB, no wheeze or crackles Neuro:  Normal gait. PERLA. EOMi. Alert. Oriented x3 Psych: Normal affect, dress and demeanor. Normal speech. Normal thought content and judgment.  No exam data present No results found. No results found for this or any previous visit (from the past 24 hour(s)).  Assessment/Plan: SAMIA KUKLA is a 42 y.o. female present for OV for  Depression, major, single episode, mild (HCC) Stable. Refilled - continue zoloft 200 mg QD.  F/U 6 mos  ADHD:  Refills provided today adderall 15 mg BID. Total of 6 mon. Refilled.  NCCS database reviewed 03/27/18 Controlled substance contract signed.  F/u 6 mos   Reviewed expectations re: course of current medical issues.  Discussed self-management of symptoms.  Outlined signs and symptoms indicating need for more acute intervention.  Patient verbalized understanding and all questions were answered.  Patient received an After-Visit Summary.    No orders of the defined types were placed in this encounter.   Note is dictated utilizing voice recognition software. Although note has been proof read prior to  signing, occasional typographical errors still can be missed. If any questions arise, please do not hesitate to call for verification.   electronically signed by:  Felix Pacini, DO  [facility] - OR

## 2018-07-17 ENCOUNTER — Ambulatory Visit: Payer: Self-pay | Admitting: *Deleted

## 2018-07-17 NOTE — Telephone Encounter (Signed)
Pt states her daughter was diagnosed  with Type B flu this AM. Daughter's symptoms started over weekend, febrile yesterday.  Pt is questioning if she should start Tamiflu; would like Dr. Alan Ripper opinion on medication.Marland Kitchen effectiveness, side effects. Pt is asymptomatic at this time.  If prescribed: [facility] [place]. Please advise:  [phone]  Reason for Disposition . [1] Caller requesting NON-URGENT health information AND [2] PCP's office is the best resource  Answer Assessment - Initial Assessment Questions 1. REASON FOR CALL or QUESTION: "What is your reason for calling today?" or "How can I best help you?" or "What question do you have that I can help answer?"     Advise regarding TAmiflu  Protocols used: INFORMATION ONLY CALL-A-AH

## 2018-07-17 NOTE — Telephone Encounter (Signed)
If patient is requesting "preventative tamiflu" treatment for herself, then no, Dr Claiborne Billings does not recommend Tamiflu as a preventative treatment.  Tried to call patient, no answer.  Please advise patient no preventative tamiflu will be called into pharmacy.

## 2018-09-25 ENCOUNTER — Encounter: Payer: Self-pay | Admitting: Family Medicine

## 2018-09-25 ENCOUNTER — Ambulatory Visit (INDEPENDENT_AMBULATORY_CARE_PROVIDER_SITE_OTHER): Payer: 59 | Admitting: Family Medicine

## 2018-09-25 ENCOUNTER — Other Ambulatory Visit: Payer: Self-pay

## 2018-09-25 ENCOUNTER — Ambulatory Visit: Payer: Managed Care, Other (non HMO) | Admitting: Family Medicine

## 2018-09-25 VITALS — HR 94 | Ht <= 58 in

## 2018-09-25 DIAGNOSIS — F320 Major depressive disorder, single episode, mild: Secondary | ICD-10-CM

## 2018-09-25 DIAGNOSIS — F909 Attention-deficit hyperactivity disorder, unspecified type: Secondary | ICD-10-CM

## 2018-09-25 MED ORDER — SERTRALINE HCL 100 MG PO TABS: 200.0000 mg | ORAL_TABLET | Freq: Every day | ORAL | 1 refills | Status: DC

## 2018-09-25 MED ORDER — AMPHETAMINE-DEXTROAMPHETAMINE 15 MG PO TABS: 15.0000 mg | ORAL_TABLET | Freq: Two times a day (BID) | ORAL | 0 refills | Status: DC

## 2018-09-25 NOTE — Patient Instructions (Signed)
Refill provided on medications today for a total of 6 months.  Follow-up in 6 months with a face-to-face visit.

## 2018-09-25 NOTE — Progress Notes (Signed)
Virtual Visit via Video   I connected with [name] on 09/25/18 at  9:00 AM EDT by a video enabled telemedicine application and verified that I am speaking with the correct person using two identifiers. Location patient: Home Location provider: [facility], Office Persons participating in the virtual visit: Patient, Dr. Claiborne Billings and R.Baker, LPN  I discussed the limitations of evaluation and management by telemedicine and the availability of in person appointments. The patient expressed understanding and agreed to proceed.  Subjective:   Chief Complaint  Patient presents with  . Depression    No complaints or concerns. Increase in Sertraline is working well for patient. Pt needs refills. Unable to get vital signs at home    HPI:  Depression/ADD: Patient is prescribed Zoloft 200 mg daily.  She is doing well on this dose.  Her ADD is well controlled on adderall 15 mg BID. She reports she has been on multiple different medications with either side effects or  failure with Cymbalta, Lexapro, Trintellex, Brintellix, vybrid (nightmares) and Paxil. Patient reports she is adjusting to the COVID-19 outbreak.  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  ROS: See pertinent positives and negatives per HPI. Indication for controlled substance: ADD Medication and dose: Adderall 15 mg twice daily # pills per month: 60 Last UDS date: collection next visit since 2/2 virtual visit 2/2  covid19 contract signed (Y/N): Y Date narcotic database last reviewed (include red flags): 09/25/18  Patient Active Problem List   Diagnosis Date Noted  . Angular cheilitis 03/13/2018  . Sleep apnea   . Elevated TSH 11/01/2017  . Elevated hemoglobin (HCC) 11/01/2017  . Morbid obesity (HCC) 10/31/2017  . Attention deficit hyperactivity disorder (ADHD) 10/03/2017  . Depression, major, single episode, mild (HCC) 10/03/2017  . S/P cervical spinal fusion 06/02/2014     Social History   Tobacco Use  . Smoking status: Never Smoker  . Smokeless tobacco: Never Used  Substance Use Topics  . Alcohol use: No    Current Outpatient Medications:  .  amphetamine-dextroamphetamine (ADDERALL) 15 MG tablet, Take 1 tablet by mouth 2 (two) times daily., Disp: 180 tablet, Rfl: 0 .  azelastine (ASTELIN) 0.1 % nasal spray, Place 1 spray into the nose daily., Disp: , Rfl:  .  lansoprazole (PREVACID) 30 MG capsule, Take by mouth., Disp: , Rfl:  .  mupirocin ointment (BACTROBAN) 2 %, Place 1 application into the nose 2 (two) times daily., Disp: 22 g, Rfl: 0 .  sertraline (ZOLOFT) 100 MG tablet, Take 2 tablets (200 mg total) by mouth daily., Disp: 180 tablet, Rfl: 1  Allergies  Allergen Reactions  . Ativan [Lorazepam] Other (See Comments)    hyperactivity  . Penicillins Rash    Objective:  Pulse 94   Ht 4\' 10"  (1.473 m)   BMI 43.89 kg/m  Gen: No acute distress. Nontoxic in appearance.  HENT: AT. [place].  MMM.  Chest: Cough or shortness of breath not present.  Neuro: . Alert. Oriented x3  Psych: Normal affect, dress and demeanor. Normal speech. Normal thought content and judgment.  Depression screen [facility] 2/9 09/25/2018 03/27/2018 01/31/2018 10/31/2017 10/03/2017  Decreased Interest 0 0 2 0 0  Down, Depressed, Hopeless 0 0 2 0 0  PHQ - 2 Score 0 0 4 0 0  Altered sleeping 1 0 2 3 3   Tired, decreased energy 1 0 3 3 3   Change in appetite 0 0 3 1 1  Feeling bad or failure about yourself  0 0 1 0 0  Trouble concentrating 0 0 1 0 3  Moving slowly or fidgety/restless 0 0 1 0 0  Suicidal thoughts 0 0 0 0 0  PHQ-9 Score 2 0 15 7 10   Difficult doing work/chores Not difficult at all Not difficult at all Somewhat difficult - Not difficult at all   GAD 7 : Generalized Anxiety Score 09/25/2018 01/31/2018 10/03/2017  Nervous, Anxious, on Edge 0 2 1  Control/stop worrying 0 2 1  Worry too much - different things 0 3 1  Trouble relaxing 1 3 2   Restless 0 1 1  Easily annoyed or  irritable 0 3 0  Afraid - awful might happen 0 1 0  Total GAD 7 Score 1 15 6   Anxiety Difficulty Not difficult at all Somewhat difficult Not difficult at all    Assessment and Plan:  [name] SailorsKathryn L Westmoreland is a 43 y.o. female present for  Depression, major, single episode, mild (HCC) Stable.  Continue Zoloft 200 mg QD.  F/U 6 mos  ADHD:  Stable.  Continue Adderall 15 mg twice daily. Refills provided today adderall 15 mg ID total of 313-month prescription provided. NCCS database reviewed 09/25/18 Controlled substance contract signed.  F/u 6 mos       Felix PaciniRenee Kuneff, DO 09/25/2018

## 2018-10-01 ENCOUNTER — Encounter: Payer: Self-pay | Admitting: Family Medicine

## 2018-12-26 ENCOUNTER — Ambulatory Visit (INDEPENDENT_AMBULATORY_CARE_PROVIDER_SITE_OTHER): Payer: 59 | Admitting: Surgery

## 2018-12-26 ENCOUNTER — Encounter: Payer: Self-pay | Admitting: Surgery

## 2018-12-26 ENCOUNTER — Other Ambulatory Visit: Payer: Self-pay

## 2018-12-26 ENCOUNTER — Ambulatory Visit: Payer: 59

## 2018-12-26 VITALS — Ht <= 58 in | Wt 200.0 lb

## 2018-12-26 DIAGNOSIS — M1712 Unilateral primary osteoarthritis, left knee: Secondary | ICD-10-CM

## 2018-12-26 DIAGNOSIS — M25562 Pain in left knee: Secondary | ICD-10-CM | POA: Diagnosis not present

## 2018-12-26 MED ORDER — METHYLPREDNISOLONE ACETATE 40 MG/ML IJ SUSP
80.0000 mg | INTRAMUSCULAR | Status: AC | PRN
  Administered 2018-12-26: 80 mg via INTRA_ARTICULAR

## 2018-12-26 MED ORDER — LIDOCAINE HCL 1 % IJ SOLN
3.0000 mL | INTRAMUSCULAR | Status: AC | PRN
  Administered 2018-12-26: 3 mL

## 2018-12-26 MED ORDER — BUPIVACAINE HCL 0.25 % IJ SOLN
6.0000 mL | INTRAMUSCULAR | Status: AC | PRN
  Administered 2018-12-26: 6 mL via INTRA_ARTICULAR

## 2018-12-26 NOTE — Progress Notes (Signed)
Office Visit Note   Patient: [name]           Date of Birth: 04/21/1976           MRN: [id number] Visit Date: 12/26/2018              Requested by: Ma Hillock, DO [address] [place],  [place] [id number] PCP: Ma Hillock, DO   Assessment & Plan: Visit Diagnoses:  1. Acute pain of left knee   2. Arthritis of left knee     Plan: In hopes of giving patient some relief of her knee pain I offered injection.  Left knee was prepped with Betadine intra-articular Marcaine/Depo-Medrol injection was performed.  Ice on the knee today as needed.  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  Follow-up in a few weeks for recheck.  Before that appointment if she is doing fine she may call and cancel or the knee continues to be bothersome we will proceed with getting an MRI to evaluate the extent of her degenerative changes and also rule out meniscal tear..  Follow-Up Instructions: Return in about 4 weeks (around 01/23/2019) for with Madge Therrien recheck left knee. .   Orders:  Orders Placed This Encounter  Procedures  . Large Joint Inj  . XR Knee 1-2 Views Left   No orders of the defined types were placed in this encounter.     Procedures: Large Joint Inj: L knee on 12/26/2018 9:22 AM Indications: pain Details: 25 G 1.5 in needle, anteromedial approach Medications: 3 mL lidocaine 1 %; 6 mL bupivacaine 0.25 %; 80 mg methylPREDNISolone acetate 40 MG/ML Consent was given by the patient. Patient was prepped and draped in the usual sterile fashion.       Clinical Data: No additional findings.   Subjective: Chief Complaint  Patient presents with  . Left Knee - Pain, Follow-up    HPI Patient returns today with complaints of left knee pain.  She was last seen by me for the same problem August 2019.  Bothersome when she is ambulating and squatting.  She still works in [employer].  She has tried to get better foot wear but this has not really helped.  Last office visit I discussed possibly doing a left knee intra-articular injection and she is wanting to proceed with doing that.  Plan of any true mechanical symptoms or feeling of instability.  Has had some swelling with stiffness.  No lumbar hip or radicular component. Review of Systems No current cardiac pulmonary GI GU issues  Objective: Vital Signs: Ht '4\' 10"'$  (1.473 m)   Wt 200 lb (90.7 kg)   BMI 41.80 kg/m   Physical Exam HENT:     Head: Normocephalic.  Pulmonary:     Effort: No respiratory distress.  Musculoskeletal:     Comments: Gait is somewhat antalgic.  Negative logroll bilateral hips.  Negative straight leg raise.  Left knee has good range of motion.  Swelling without significant effusion.  Moderately tender at the medial joint line.  Positive patellofemoral crepitus and patellar grind.  Negative McMurray's test.  Cruciate collateral ligaments are stable.  Calf nontender.  Neurovascular intact.  Skin:    General: Skin is warm and dry.  Neurological:     General: No focal deficit present.     Mental Status: She is alert.  Ortho Exam  Specialty Comments:  No specialty comments available.  Imaging: No results found.   PMFS History: Patient Active Problem List   Diagnosis Date Noted  . Angular cheilitis 03/13/2018  . Sleep apnea   . Elevated TSH 11/01/2017  . Elevated hemoglobin ([place]) 11/01/2017  . Morbid obesity ([place]) 10/31/2017  . Attention deficit hyperactivity disorder (ADHD) 10/03/2017  . Depression, major, single episode, mild ([place]) 10/03/2017  . BRCA1 negative 05/11/2016  . S/P cervical spinal fusion 06/02/2014   Past Medical History:  Diagnosis Date  . ADD (attention deficit disorder)   . Arthritis   . Chicken pox   . Chronic neck pain    HNP  . Depression   . Headache   . Heart murmur    as a baby  . Insomnia   . Nocturia   . Sleep apnea    CPAP-SLEEP  STUDY DONE ABOUT 2 YRS AGO  . Thyroid disease   . UTI (urinary tract infection)     Family History  Problem Relation Age of Onset  . Arthritis Mother   . Depression Mother   . Drug abuse Mother   . Hyperlipidemia Mother   . Hypertension Mother   . Alcohol abuse Father   . Cancer Sister   . Cervical cancer Sister   . Early death Son   . Cancer Maternal Grandmother   . Breast cancer Maternal Grandmother   . Heart disease Paternal Grandmother   . Alcohol abuse Paternal Grandfather     Past Surgical History:  Procedure Laterality Date  . ANTERIOR CERVICAL DECOMP/DISCECTOMY FUSION N/A 06/02/2014   Procedure: C6-7 Anterior Cervical Discectomy and Fusion, Allograft, Plate;  Surgeon: Marybelle Killings, MD;  Location: [facility];  Service: Orthopedics;  Laterality: N/A;  . CESAREAN SECTION  2004/2007  . DILATION AND CURETTAGE OF UTERUS  2018  . wisdom teeth extracted      Social History   Occupational History  . Occupation: Occupational psychologist  Tobacco Use  . Smoking status: Never Smoker  . Smokeless tobacco: Never Used  Substance and Sexual Activity  . Alcohol use: No  . Drug use: No  . Sexual activity: Yes    Partners: Male    Birth control/protection: Pill

## 2019-01-23 ENCOUNTER — Ambulatory Visit: Payer: 59 | Admitting: Surgery

## 2019-04-17 ENCOUNTER — Other Ambulatory Visit: Payer: Self-pay | Admitting: Family Medicine

## 2019-04-17 NOTE — Telephone Encounter (Signed)
Pt was called and message was left to return in regards to a medication refill request.

## 2019-04-17 NOTE — Telephone Encounter (Signed)
Please call patient. I have received refill request for her zoloft and pt is due for 6 mos follow up. Please schedule ASAP. Please make sure she has a 30 day supply to allow her time to be seen.

## 2019-04-23 ENCOUNTER — Encounter: Payer: Self-pay | Admitting: Family Medicine

## 2019-04-23 ENCOUNTER — Ambulatory Visit (INDEPENDENT_AMBULATORY_CARE_PROVIDER_SITE_OTHER): Payer: Managed Care, Other (non HMO) | Admitting: Family Medicine

## 2019-04-23 ENCOUNTER — Other Ambulatory Visit: Payer: Self-pay

## 2019-04-23 VITALS — BP 118/84 | HR 92 | Temp 98.8°F | Resp 16 | Ht <= 58 in | Wt 215.0 lb

## 2019-04-23 DIAGNOSIS — F909 Attention-deficit hyperactivity disorder, unspecified type: Secondary | ICD-10-CM | POA: Diagnosis not present

## 2019-04-23 DIAGNOSIS — F320 Major depressive disorder, single episode, mild: Secondary | ICD-10-CM

## 2019-04-23 MED ORDER — VENLAFAXINE HCL ER 37.5 MG PO CP24: ORAL_CAPSULE | ORAL | 0 refills | Status: DC

## 2019-04-23 MED ORDER — AMPHETAMINE-DEXTROAMPHETAMINE 15 MG PO TABS: 15.0000 mg | ORAL_TABLET | Freq: Two times a day (BID) | ORAL | 0 refills | Status: DC

## 2019-04-23 MED ORDER — VENLAFAXINE HCL ER 75 MG PO CP24: 75.0000 mg | ORAL_CAPSULE | Freq: Every day | ORAL | 0 refills | Status: DC

## 2019-04-23 MED ORDER — SERTRALINE HCL 100 MG PO TABS: 100.0000 mg | ORAL_TABLET | Freq: Every day | ORAL | 0 refills | Status: DC

## 2019-04-23 NOTE — Progress Notes (Signed)
Patient Care Team    Relationship Specialty Notifications Start End  Ma Hillock, DO PCP - General Family Medicine  10/03/17   Tawni Levy, MD Referring Physician Internal Medicine  10/04/17   Christain Sacramento, [facility] Referring Physician Optometry  10/04/17   Rodena Goldmann., MD Referring Physician Obstetrics and Gynecology  10/04/17      Subjective:   Chief Complaint  Patient presents with  . ADHD    needs refills. took medication  . Depression    needs refills. took medication    HPI: [name] is a 43 y.o. female present for [facility] followup.  Depression/ADD: Patient is prescribed Zoloft 200 mg daily.  She is doing well on this dose. However she is frustrated with the weight gain and inability to take weight off.   Her ADD is well controlled on adderall 15 mg BID. She reports she has been on multiple different medications with either side effects or  failure with Cymbalta, Lexapro, Trintellex, Brintellix, vybrid (nightmares) and Paxil.She would like to try a different medication for her depression if possible.   Indication for controlled substance: ADD Medication and dose: Adderall 15 mg twice daily # pills per month: 60/mo> refill 90 day at a time.  Last UDS date: collected today 04/23/2019 contract signed (Y/N): Y- resigned today 04/23/2019 Date narcotic database last reviewed (include red flags): 04/23/19  Patient Active Problem List   Diagnosis Date Noted  . Angular cheilitis 03/13/2018  . Sleep apnea   . Elevated TSH 11/01/2017  . Elevated hemoglobin ([place]) 11/01/2017  . Morbid obesity ([place]) 10/31/2017  . Attention deficit hyperactivity disorder (ADHD) 10/03/2017  . Depression, major, single episode, mild ([place]) 10/03/2017  . BRCA1 negative 05/11/2016  . S/P cervical spinal fusion 06/02/2014    Social History   Tobacco Use  . Smoking status: Never Smoker  . Smokeless tobacco: Never Used  Substance Use Topics  . Alcohol use: No    Current Outpatient  Medications:  .  amphetamine-dextroamphetamine (ADDERALL) 15 MG tablet, Take 1 tablet by mouth 2 (two) times daily., Disp: 180 tablet, Rfl: 0 .  azelastine (ASTELIN) 0.1 % nasal spray, Place 1 spray into the nose daily., Disp: , Rfl:  .  DENTA 5000 PLUS 1.1 % CREA dental cream, Take 1 application by mouth daily., Disp: , Rfl:  .  lansoprazole (PREVACID) 30 MG capsule, Take by mouth., Disp: , Rfl:  .  sertraline (ZOLOFT) 100 MG tablet, Take 2 tablets (200 mg total) by mouth daily., Disp: 180 tablet, Rfl: 1 .  mupirocin ointment (BACTROBAN) 2 %, Place 1 application into the nose 2 (two) times daily. (Patient not taking: Reported on 04/23/2019), Disp: 22 g, Rfl: 0  Allergies  Allergen Reactions  . Ativan [Lorazepam] Other (See Comments)    hyperactivity  . Penicillins Rash    Objective:  BP 118/84 (BP Location: Left Arm, Patient Position: Sitting, Cuff Size: Large)   Pulse 92   Temp 98.8 F (37.1 C) (Temporal)   Resp 16   Ht '4\' 10"'$  (1.473 m)   Wt 215 lb (97.5 kg)   SpO2 100%   BMI 44.94 kg/m  Gen: Afebrile. No acute distress. Very pleasant caucasian female.  HENT: AT. [place].  Eyes:Pupils Equal Round Reactive to light, Extraocular movements intact,  Conjunctiva without redness, discharge or icterus. CV: RRR  Chest: CTAB, no wheeze or crackles Neuro:  Normal gait. PERLA. EOMi. Alert. Oriented x3  Psych: Normal affect, dress and demeanor. Normal speech. Normal thought  content and judgment.  Depression screen [facility] 2/9 04/23/2019 09/25/2018 03/27/2018 01/31/2018 10/31/2017  Decreased Interest 1 0 0 2 0  Down, Depressed, Hopeless 0 0 0 2 0  PHQ - 2 Score 1 0 0 4 0  Altered sleeping 1 1 0 2 3  Tired, decreased energy 3 1 0 3 3  Change in appetite 0 0 0 3 1  Feeling bad or failure about yourself  0 0 0 1 0  Trouble concentrating 1 0 0 1 0  Moving slowly or fidgety/restless 0 0 0 1 0  Suicidal thoughts 0 0 0 0 0  PHQ-9 Score 6 2 0 15 7  Difficult doing work/chores Not difficult at all Not  difficult at all Not difficult at all Somewhat difficult -   GAD 7 : Generalized Anxiety Score 09/25/2018 01/31/2018 10/03/2017  Nervous, Anxious, on Edge 0 2 1  Control/stop worrying 0 2 1  Worry too much - different things 0 3 1  Trouble relaxing '1 3 2  '$ Restless 0 1 1  Easily annoyed or irritable 0 3 0  Afraid - awful might happen 0 1 0  Total GAD 7 Score '1 15 6  '$ Anxiety Difficulty Not difficult at all Somewhat difficult Not difficult at all    Assessment and Plan:  [name] is a 43 y.o. female present for  Depression, major, single episode, mild (H[facility]) Stable. However she is frustrated with the weight gain she feels is partially 2/2/ to zoloft use.  Start effexor 37.5 mg a day and continue zoloft 200  Mg (2 tabs) a day for 7 days. On day 8>> increase effexor to 75 mg (2 tabs) and decrease zoloft to 100 mg (1 tab). When you go to get your refill on effexor in 4 weeks the tabs will be 75 mg each then>> so only take 1 tab AND decrease your zoloft to 50 mg (1/2 tab). After follow up will taper completely off zoloft if able.  F/U 5-6 weeks.   ADHD:  Stable.  Continue Adderall 15 mg twice daily. Refills provided today adderall 15 mg (90d x2),  61-monthprescription provided. NCCS database reviewed 04/23/19 Controlled substance contract signed today 04/23/2019. F/u 6 mos      Orders Placed This Encounter  Procedures  . Pain Mgmt, Profile 8 w/Conf, U   > 25 minutes spent with patient, >50% of time spent face to face     RHoward Pouch DO 04/23/2019

## 2019-04-23 NOTE — Patient Instructions (Signed)
Start effexor 37.5 mg a day and continue zoloft 200  Mg (2 tabs) a day for 7 days. On day 8>> increase effexor to 75 mg (2 tabs) and decrease zoloft to 100 mg (1 tab). When you go to get your refill on effexor in 4 weeks the tabs will be 75 mg each then>> so only take 1 tab AND decrease your zoloft to 50 mg (1/2 tab).  Follow up in 5-6 weeks>> can be virtual or in person.

## 2019-04-25 LAB — PAIN MGMT, PROFILE 8 W/CONF, U
6 Acetylmorphine: NEGATIVE ng/mL
Alcohol Metabolites: NEGATIVE ng/mL (ref <500)
Alphahydroxyalprazolam: NEGATIVE ng/mL
Alphahydroxymidazolam: NEGATIVE ng/mL
Alphahydroxytriazolam: NEGATIVE ng/mL
Aminoclonazepam: NEGATIVE ng/mL
Amphetamine: 10424 ng/mL
Amphetamines: POSITIVE ng/mL
Benzodiazepines: NEGATIVE ng/mL
Buprenorphine, Urine: NEGATIVE ng/mL
Cocaine Metabolite: NEGATIVE ng/mL
Creatinine: 203.2 mg/dL
Hydroxyethylflurazepam: NEGATIVE ng/mL
Lorazepam: NEGATIVE ng/mL
MDMA: NEGATIVE ng/mL
Marijuana Metabolite: NEGATIVE ng/mL
Methamphetamine: NEGATIVE ng/mL
Nordiazepam: NEGATIVE ng/mL
Noroxycodone: 68 ng/mL
Opiates: NEGATIVE ng/mL
Oxazepam: NEGATIVE ng/mL
Oxidant: NEGATIVE ug/mL
Oxycodone: NEGATIVE ng/mL
Oxycodone: POSITIVE ng/mL
Oxymorphone: 67 ng/mL
Temazepam: NEGATIVE ng/mL
pH: 5.9 (ref 4.5–9.0)

## 2019-05-15 ENCOUNTER — Encounter: Payer: Self-pay | Admitting: Family Medicine

## 2019-05-21 ENCOUNTER — Other Ambulatory Visit: Payer: Self-pay

## 2019-05-21 ENCOUNTER — Encounter: Payer: Self-pay | Admitting: Family Medicine

## 2019-05-21 ENCOUNTER — Ambulatory Visit (INDEPENDENT_AMBULATORY_CARE_PROVIDER_SITE_OTHER): Payer: Managed Care, Other (non HMO) | Admitting: Family Medicine

## 2019-05-21 VITALS — Ht <= 58 in

## 2019-05-21 DIAGNOSIS — F320 Major depressive disorder, single episode, mild: Secondary | ICD-10-CM

## 2019-05-21 MED ORDER — VENLAFAXINE HCL ER 75 MG PO CP24: 75.0000 mg | ORAL_CAPSULE | Freq: Every day | ORAL | 1 refills | Status: DC

## 2019-05-21 NOTE — Progress Notes (Signed)
VIRTUAL VISIT VIA VIDEO  I connected with [name] on 05/21/19 at  9:00 AM EST by a video enabled telemedicine application and verified that I am speaking with the correct person using two identifiers. Location patient: Home Location provider: [facility], Office Persons participating in the virtual visit: Patient, Dr. Raoul Pitch and R.Baker, LPN  I discussed the limitations of evaluation and management by telemedicine and the availability of in person appointments. The patient expressed understanding and agreed to proceed.     Patient Care Team    Relationship Specialty Notifications Start End  Ma Hillock, DO PCP - General Family Medicine  10/03/17   Tawni Levy, MD Referring Physician Internal Medicine  10/04/17   Christain Sacramento, [place] Referring Physician Optometry  10/04/17   Rodena Goldmann., MD Referring Physician Obstetrics and Gynecology  10/04/17      Subjective:   Chief Complaint  Patient presents with  . Depression    Pt has been doing well with changes, no side effects     HPI: [name] is a 43 y.o. female present for close follow up after med changes.  Depression/ADD: Patient was prescribed Zoloft 200 mg daily.  She is doing well on that dose. However she became  frustrated with the weight gain and inability to take weight off.   Her ADD is well controlled on adderall 15 mg BID. She reports she has been on multiple different medications with either side effects or  failure with Cymbalta, Lexapro, Trintellex, Brintellix, vybrid (nightmares) and Paxil.  After last visit patient was tapered down on zoloft and onto effexor 75 mg QD. She is currently taking effexor 75 mg and zoloft 100 mg Qd.  She reports she is feeling much improved on the new regimen.  Much more than when she was taking the Zoloft.  Indication for controlled substance: ADD Medication and dose: Adderall 15 mg twice daily # pills per month: 60/mo> refill 90 day at  a time.  Last UDS date: collected today 04/23/2019 contract signed (Y/N): Y- resigned today 04/23/2019 Date narcotic database last reviewed (include red flags): no refills provided today  Patient Active Problem List   Diagnosis Date Noted  . Angular cheilitis 03/13/2018  . Sleep apnea   . Elevated TSH 11/01/2017  . Elevated hemoglobin ([place]) 11/01/2017  . Morbid obesity ([place]) 10/31/2017  . Attention deficit hyperactivity disorder (ADHD) 10/03/2017  . Depression, major, single episode, mild ([place]) 10/03/2017  . BRCA1 negative 05/11/2016  . S/P cervical spinal fusion 06/02/2014    Social History   Tobacco Use  . Smoking status: Never Smoker  . Smokeless tobacco: Never Used  Substance Use Topics  . Alcohol use: No    Current Outpatient Medications:  .  amphetamine-dextroamphetamine (ADDERALL) 15 MG tablet, Take 1 tablet by mouth 2 (two) times daily., Disp: 180 tablet, Rfl: 0 .  azelastine (ASTELIN) 0.1 % nasal spray, Place 1 spray into the nose daily., Disp: , Rfl:  .  lansoprazole (PREVACID) 30 MG capsule, Take by mouth., Disp: , Rfl:  .  sertraline (ZOLOFT) 100 MG tablet, Take 1 tablet (100 mg total) by mouth daily., Disp: 90 tablet, Rfl: 0 .  venlafaxine XR (EFFEXOR XR) 37.5 MG 24 hr capsule, Take 1 capsule (37.5 mg total) by mouth daily with breakfast for 7 days, THEN 2 capsules (75 mg total) daily with breakfast for 23 days., Disp: 53 capsule, Rfl: 0 .  venlafaxine XR (EFFEXOR XR) 75 MG  24 hr capsule, Take 1 capsule (75 mg total) by mouth daily with breakfast., Disp: 30 capsule, Rfl: 0  Allergies  Allergen Reactions  . Ativan [Lorazepam] Other (See Comments)    hyperactivity  . Penicillins Rash    Objective:  Ht _0  (1.473 m)   BMI 44.94 kg/m  Gen: Afebrile. No acute distress.  HENT: AT. [place].  Eyes:Pupils Equal Round Reactive to light, Extraocular movements intact,  Conjunctiva without redness, discharge or icterus. Neuro:  Normal gait. PERLA. EOMi. Alert. Oriented  x3  Psych: Normal affect, dress and demeanor. Normal speech. Normal thought content and judgment.  Depression screen [facility] 2/9 05/21/2019 04/23/2019 09/25/2018 03/27/2018 01/31/2018  Decreased Interest 0 1 0 0 2  Down, Depressed, Hopeless 0 0 0 0 2  PHQ - 2 Score 0 1 0 0 4  Altered sleeping 0 1 1 0 2  Tired, decreased energy 0 3 1 0 3  Change in appetite 0 0 0 0 3  Feeling bad or failure about yourself  0 0 0 0 1  Trouble concentrating 0 1 0 0 1  Moving slowly or fidgety/restless 0 0 0 0 1  Suicidal thoughts 0 0 0 0 0  PHQ-9 Score 0 6 2 0 15  Difficult doing work/chores Not difficult at all Not difficult at all Not difficult at all Not difficult at all Somewhat difficult   GAD 7 : Generalized Anxiety Score 09/25/2018 01/31/2018 10/03/2017  Nervous, Anxious, on Edge 0 2 1  Control/stop worrying 0 2 1  Worry too much - different things 0 3 1  Trouble relaxing _1 Restless 0 1 1  Easily annoyed or irritable 0 3 0  Afraid - awful might happen 0 1 0  Total GAD 7 Score _2 Anxiety Difficulty Not difficult at all Somewhat difficult Not difficult at all    Assessment and Plan:  [name] is a 43 y.o. female present for  Depression, major, single episode, mild ([place]) -Patient reports symptoms are much improved. -Continue Effexor 75 mg daily, 90-day prescription with 1 refill provided today. -She was instructed to taper down to 50 mg of Zoloft today for 7 days, then discontinue Zoloft altogether. F/U 5 mos, unless needed sooner.   ADHD:  stable.  Continue Adderall 15 mg twice daily. Refills provided 1 month ago adderall 15 mg (90d x2),  66-monthprescription provided. NCCS database reviewed 04/23/2019. Controlled substance contract signed today 04/23/2019. F/u q 6 mos (from last appt- due in April)      No orders of the defined types were placed in this encounter.  > 15 minutes spent with patient, > 50% of that time face to face   RHoward Pouch DO 05/21/2019

## 2019-06-04 ENCOUNTER — Ambulatory Visit (INDEPENDENT_AMBULATORY_CARE_PROVIDER_SITE_OTHER): Payer: Managed Care, Other (non HMO) | Admitting: Family Medicine

## 2019-06-04 ENCOUNTER — Other Ambulatory Visit: Payer: Self-pay

## 2019-06-04 ENCOUNTER — Encounter: Payer: Self-pay | Admitting: Family Medicine

## 2019-06-04 VITALS — Temp 99.3°F | Ht <= 58 in

## 2019-06-04 DIAGNOSIS — Z7189 Other specified counseling: Secondary | ICD-10-CM | POA: Diagnosis not present

## 2019-06-04 DIAGNOSIS — R05 Cough: Secondary | ICD-10-CM | POA: Diagnosis not present

## 2019-06-04 DIAGNOSIS — R059 Cough, unspecified: Secondary | ICD-10-CM

## 2019-06-04 DIAGNOSIS — R0981 Nasal congestion: Secondary | ICD-10-CM

## 2019-06-04 NOTE — Progress Notes (Signed)
VIRTUAL VISIT VIA VIDEO  I connected with [name] on 06/04/19 at  4:00 PM EST by a video enabled telemedicine application and verified that I am speaking with the correct person using two identifiers. Location patient: Home Location provider: [facility], Office Persons participating in the virtual visit: Patient, Dr. Raoul Pitch and R.Baker, LPN  I discussed the limitations of evaluation and management by telemedicine and the availability of in person appointments. The patient expressed understanding and agreed to proceed.   SUBJECTIVE Chief Complaint  Patient presents with  . Cough    x2 days. Someone at her work was [employer] and she thinks she was at work with them on Saturday. Denies SOB.   . Nasal Congestion  . Generalized Body Aches  . Sore Throat  . Fever    HPI: [name] is a 43 y.o. female present for acute illness.  Patient reports she started having symptoms on the 19th after being exposed to COVID-19 on the 18th.  She reports having fever of 100.3 F, nasal congestion, chills, sweats, body aches, sore throat.  She states she has been so tired she has been unable to get out of bed.  She denies shortness of breath or loss of taste or smell.  She has been attempting to stay hydrated and using over-the-counter products for supportive therapy.  ROS: See pertinent positives and negatives per HPI.  Patient Active Problem List   Diagnosis Date Noted  . Angular cheilitis 03/13/2018  . Sleep apnea   . Elevated TSH 11/01/2017  . Elevated hemoglobin ([place]) 11/01/2017  . Morbid obesity ([place]) 10/31/2017  . Attention deficit hyperactivity disorder (ADHD) 10/03/2017  . Depression, major, single episode, mild ([place]) 10/03/2017  . BRCA1 negative 05/11/2016  . S/P cervical spinal fusion 06/02/2014    Social History   Tobacco Use  . Smoking status: Never Smoker  . Smokeless tobacco: Never Used  Substance Use Topics  . Alcohol use: No    Current  Outpatient Medications:  .  amphetamine-dextroamphetamine (ADDERALL) 15 MG tablet, Take 1 tablet by mouth 2 (two) times daily., Disp: 180 tablet, Rfl: 0 .  azelastine (ASTELIN) 0.1 % nasal spray, Place 1 spray into the nose daily., Disp: , Rfl:  .  lansoprazole (PREVACID) 30 MG capsule, Take by mouth., Disp: , Rfl:  .  sertraline (ZOLOFT) 100 MG tablet, Take 1 tablet (100 mg total) by mouth daily., Disp: 90 tablet, Rfl: 0 .  venlafaxine XR (EFFEXOR XR) 75 MG 24 hr capsule, Take 1 capsule (75 mg total) by mouth daily with breakfast., Disp: 90 capsule, Rfl: 1  Allergies  Allergen Reactions  . Ativan [Lorazepam] Other (See Comments)    hyperactivity  . Penicillins Rash    OBJECTIVE: Temp 99.3 F (37.4 C) (Temporal)   Ht '4\' 10"'$  (1.473 m)   BMI 44.94 kg/m  Gen: No acute distress. Nontoxic in appearance.  Patient appears fatigued and tired. HENT: AT. [place].  MMM.  Eyes:Pupils Equal Round Reactive to light, Extraocular movements intact,  Conjunctiva without redness, discharge or icterus. Chest: Cough or shortness of breath not present on exam today. Skin: No rashes, purpura or petechiae.  Neuro:  Normal gait. Alert. Oriented x3  Psych: Normal affect, dress and demeanor. Normal speech. Normal thought content and judgment.  ASSESSMENT AND PLAN: ARACELIE ADDIS is a 43 y.o. female present for  Cough/nasal congestion/COVID-19 education She was encouraged to rest, hydrate and use over-the-counter anti-inflammatories for headache, fever or myalgias. Patient was  provided education on COVID-19 quarantine/self-isolation protocol. Work excuse provided to her via her email account starting today and extended until January 4 or negative test. - Novel Coronavirus, N[facility] ([facility]) patient was provided with the online sign up and text message sign up for COVID-19 testing. Emergent precautions discussed with patient. Patient aware if results are returned over the holiday weekend, she will be able to  see them by her MyChart.   Follow-up as needed  > 15 minutes spent with patient, > 50% of that time face to face   Howard Pouch, DO 06/04/2019

## 2019-06-04 NOTE — Patient Instructions (Signed)

## 2019-06-05 ENCOUNTER — Encounter: Payer: Self-pay | Admitting: Family Medicine

## 2019-06-16 ENCOUNTER — Encounter: Payer: Self-pay | Admitting: Family Medicine

## 2019-06-17 NOTE — Telephone Encounter (Signed)
Pt called and letter was completed. Sent up front to be emailed to patient. Pt aware she is allowed to return to work. She has not had fever and cough has much improved since start of illness.

## 2019-06-17 NOTE — Telephone Encounter (Signed)
Pt wrote the following my chart message.  Good afternoon, I hope you guys had a great Christmas and New Years.  I know you guys are not it the office. I'm supposed to go back to work on Jan 4 that's what my doctors note says. I called work today and checked in with them. Since I have a lingering cough they said I can't come back unless my doctor releases me.  Lyndel Safe Please advise if patient needs another virtual appt. Tested + for COVID 06/05/2019. VV with [place] was 06/04/2019 with MD note writing her out of work until 06/16/2018.

## 2019-06-17 NOTE — Telephone Encounter (Signed)
Patient can return to work as long as she has been fever free for 3 days and her cough has improved over the course of her Covid illness. CDC guidelines state self-isolation for 10 days and fever free for 72 hours.  Some Covid symptoms may last for weeks after self-isolation protocol and should not hinder the return to work unless there has been fever or worsening symptoms.  She is released to return to work.  If employer needs a letter please draft a letter that states so.  Thank you.

## 2019-11-13 ENCOUNTER — Encounter: Payer: Self-pay | Admitting: Family Medicine

## 2019-11-27 ENCOUNTER — Ambulatory Visit (INDEPENDENT_AMBULATORY_CARE_PROVIDER_SITE_OTHER): Payer: 59 | Admitting: Family Medicine

## 2019-11-27 ENCOUNTER — Encounter: Payer: Self-pay | Admitting: Family Medicine

## 2019-11-27 ENCOUNTER — Other Ambulatory Visit: Payer: Self-pay

## 2019-11-27 VITALS — BP 117/82 | HR 71 | Temp 98.6°F | Resp 18 | Ht <= 58 in | Wt 214.5 lb

## 2019-11-27 DIAGNOSIS — Z79899 Other long term (current) drug therapy: Secondary | ICD-10-CM | POA: Diagnosis not present

## 2019-11-27 DIAGNOSIS — F320 Major depressive disorder, single episode, mild: Secondary | ICD-10-CM

## 2019-11-27 DIAGNOSIS — F909 Attention-deficit hyperactivity disorder, unspecified type: Secondary | ICD-10-CM | POA: Diagnosis not present

## 2019-11-27 DIAGNOSIS — R7989 Other specified abnormal findings of blood chemistry: Secondary | ICD-10-CM | POA: Diagnosis not present

## 2019-11-27 LAB — BASIC METABOLIC PANEL
BUN: 12 mg/dL (ref 6–23)
CO2: 29 mEq/L (ref 19–32)
Calcium: 8.7 mg/dL (ref 8.4–10.5)
Chloride: 105 mEq/L (ref 96–112)
Creatinine, Ser: 0.68 mg/dL (ref 0.40–1.20)
GFR: 94.68 mL/min (ref >60.00)
Glucose, Bld: 83 mg/dL (ref 70–99)
Potassium: 4.7 mEq/L (ref 3.5–5.1)
Sodium: 139 mEq/L (ref 135–145)

## 2019-11-27 LAB — TSH: TSH: 3.86 u[IU]/mL (ref 0.35–4.50)

## 2019-11-27 MED ORDER — AMPHETAMINE-DEXTROAMPHETAMINE 15 MG PO TABS: 15.0000 mg | ORAL_TABLET | Freq: Two times a day (BID) | ORAL | 0 refills | Status: DC

## 2019-11-27 MED ORDER — VENLAFAXINE HCL ER 150 MG PO CP24: 150.0000 mg | ORAL_CAPSULE | Freq: Every day | ORAL | 1 refills | Status: DC

## 2019-11-27 NOTE — Patient Instructions (Signed)
I have refilled your medications for your today.  Follow up in 5.5 months as long as doing well.

## 2019-11-27 NOTE — Progress Notes (Signed)
Patient Care Team    Relationship Specialty Notifications Start End  Ma Hillock, DO PCP - General Family Medicine  10/03/17   Tawni Levy, MD Referring Physician Internal Medicine  10/04/17   Christain Sacramento, [place] Referring Physician Optometry  10/04/17   Rodena Goldmann., MD Referring Physician Obstetrics and Gynecology  10/04/17     Chief Complaint  Patient presents with  . ADHD    Needs refills on meds. Concerned about the effexor and possibly needing a increase in dose   . Depression   HPI: [name] is a 44 y.o. female present for [facility]  Depression/ADD: Patient reports she is doing okay on Effexor 75 mg daily.  She does think now that she is tapered completely off the Zoloft she thinks she may need some more added coverage with the Effexor.  She is doing well on that dose. However she became  frustrated with the weight gain and inability to take weight off.   Her ADD is well controlled on adderall 15 mg BID. She reports she has been on multiple different medications with either side effects or  failure with Cymbalta, Lexapro, Trintellex, Brintellix, vybrid (nightmares), Zoloft(wt) and Paxil.   Indication for controlled substance: ADD Medication and dose: Adderall 15 mg twice daily # pills per month: 60/mo> refill 90 day at a time.  Last UDS date: collected today 04/23/2019 contract signed (Y/N): Y- resigned today 04/23/2019 Date narcotic database last reviewed (include red flags): 11/28/19 Depression screen [facility] 2/9 11/27/2019 05/21/2019 04/23/2019 09/25/2018 03/27/2018  Decreased Interest 0 0 1 0 0  Down, Depressed, Hopeless 1 0 0 0 0  PHQ - 2 Score 1 0 1 0 0  Altered sleeping 3 0 1 1 0  Tired, decreased energy 1 0 3 1 0  Change in appetite 0 0 0 0 0  Feeling bad or failure about yourself  0 0 0 0 0  Trouble concentrating 3 0 1 0 0  Moving slowly or fidgety/restless 0 0 0 0 0  Suicidal thoughts - 0 0 0 0  PHQ-9 Score 8 0 6 2 0  Difficult doing work/chores  Not difficult at all Not difficult at all Not difficult at all Not difficult at all Not difficult at all   GAD 7 : Generalized Anxiety Score 09/25/2018 01/31/2018 10/03/2017  Nervous, Anxious, on Edge 0 2 1  Control/stop worrying 0 2 1  Worry too much - different things 0 3 1  Trouble relaxing '1 3 2  '$ Restless 0 1 1  Easily annoyed or irritable 0 3 0  Afraid - awful might happen 0 1 0  Total GAD 7 Score '1 15 6  '$ Anxiety Difficulty Not difficult at all Somewhat difficult Not difficult at all      Patient Active Problem List   Diagnosis Date Noted  . Encounter for long-term current use of medication 11/27/2019  . Angular cheilitis 03/13/2018  . Sleep apnea   . Elevated TSH 11/01/2017  . Elevated hemoglobin ([place]) 11/01/2017  . Morbid obesity ([place]) 10/31/2017  . Attention deficit hyperactivity disorder (ADHD) 10/03/2017  . Depression, major, single episode, mild ([place]) 10/03/2017  . BRCA1 negative 05/11/2016  . S/P cervical spinal fusion 06/02/2014    Social History   Tobacco Use  . Smoking status: Never Smoker  . Smokeless tobacco: Never Used  Substance Use Topics  . Alcohol use: No    Current Outpatient Medications:  .  amphetamine-dextroamphetamine (ADDERALL) 15 MG tablet, Take  1 tablet by mouth 2 (two) times daily., Disp: 180 tablet, Rfl: 0 .  azelastine (ASTELIN) 0.1 % nasal spray, Place 1 spray into the nose daily., Disp: , Rfl:  .  lansoprazole (PREVACID) 30 MG capsule, Take by mouth., Disp: , Rfl:  .  venlafaxine XR (EFFEXOR XR) 150 MG 24 hr capsule, Take 1 capsule (150 mg total) by mouth daily with breakfast., Disp: 90 capsule, Rfl: 1  Allergies  Allergen Reactions  . Ativan [Lorazepam] Other (See Comments)    hyperactivity  . Penicillins Rash    Objective:  BP 117/82 (BP Location: Left Arm, Patient Position: Sitting, Cuff Size: Normal)   Pulse 71   Temp 98.6 F (37 C) (Temporal)   Resp 18   Ht '4\' 10"'$  (1.473 m)   Wt 214 lb 8 oz (97.3 kg)   SpO2 96%   BMI  44.83 kg/m  Gen: Afebrile. No acute distress.  Nontoxic in presentation.  Pleasant, obese female. HENT: AT. [place].  Eyes:Pupils Equal Round Reactive to light, Extraocular movements intact,  Conjunctiva without redness, discharge or icterus. CV: RRR Chest: CTAB, no wheeze or crackles  Neuro:  Normal gait. PERLA. EOMi. Alert. Oriented x3  Psych: Normal affect, dress and demeanor. Normal speech. Normal thought content and judgment.   Assessment and Plan:  [name] is a 44 y.o. female present for  Depression, major, single episode, mild ([place]) -Could use more coverage now that Zoloft is completely tapered off. -Increase Effexor 75 mg> to 150 mg daily daily F/U 5.5 mos, unless needed sooner.   ADHD:  Stable. Continue Adderall 15 mg twice daily (90d x2),  19-monthprescription provided. NCCS database reviewed 11/28/19 Controlled substance contract signed 04/23/2019. F/u q 5.5 mos      Orders Placed This Encounter  Procedures  . TSH  . Basic Metabolic Panel (BMET)   Meds ordered this encounter  Medications  . venlafaxine XR (EFFEXOR XR) 150 MG 24 hr capsule    Sig: Take 1 capsule (150 mg total) by mouth daily with breakfast.    Dispense:  90 capsule    Refill:  1  . DISCONTD: amphetamine-dextroamphetamine (ADDERALL) 15 MG tablet    Sig: Take 1 tablet by mouth 2 (two) times daily.    Dispense:  180 tablet    Refill:  0  . amphetamine-dextroamphetamine (ADDERALL) 15 MG tablet    Sig: Take 1 tablet by mouth 2 (two) times daily.    Dispense:  180 tablet    Refill:  0    May refill after 02/16/2020   Referral Orders  No referral(s) requested today      RHoward Pouch DO 11/28/2019

## 2020-03-09 ENCOUNTER — Other Ambulatory Visit: Payer: Self-pay

## 2020-03-09 ENCOUNTER — Other Ambulatory Visit (HOSPITAL_COMMUNITY)
Admission: RE | Admit: 2020-03-09 | Discharge: 2020-03-09 | Disposition: A | Discharge status: Home / Self Care | Payer: 59 | Source: Ambulatory Visit | Attending: Obstetrics & Gynecology | Admitting: Obstetrics & Gynecology

## 2020-03-09 ENCOUNTER — Ambulatory Visit (INDEPENDENT_AMBULATORY_CARE_PROVIDER_SITE_OTHER): Payer: 59 | Admitting: Obstetrics & Gynecology

## 2020-03-09 ENCOUNTER — Encounter: Payer: Self-pay | Admitting: Obstetrics & Gynecology

## 2020-03-09 VITALS — BP 137/91 | HR 85 | Ht 59.0 in | Wt 220.0 lb

## 2020-03-09 DIAGNOSIS — Z01419 Encounter for gynecological examination (general) (routine) without abnormal findings: Secondary | ICD-10-CM | POA: Diagnosis not present

## 2020-03-09 NOTE — Progress Notes (Signed)
Last pap- August 2019- negative Last mammo- 2019- normal

## 2020-03-09 NOTE — Progress Notes (Signed)
Subjective:     [name] is a 44 y.o. female here for a routine exam.  Current complaints: none; enjoys Mirena.     Gynecologic History No LMP recorded. (Menstrual status: IUD). Contraception: IUD Last Pap: 2019. Results were: normal Last mammogram: 2019. Results were: normal  Obstetric History OB History  Gravida Para Term Preterm AB Living  4 2 0   2 1  SAB TAB Ectopic Multiple Live Births  2            # Outcome Date GA Lbr Len/2nd Weight Sex Delivery Anes PTL Lv  4 SAB           3 SAB           2 Para           1 Para              The following portions of the patient's history were reviewed and updated as appropriate: allergies, current medications, past family history, past medical history, past social history, past surgical history and problem list.  Review of Systems Pertinent items noted in HPI and remainder of comprehensive ROS otherwise negative.    Objective:   Vitals:   03/09/20 1436  BP: (!) 137/91  Pulse: 85  Weight: 220 lb (99.8 kg)  Height: 4\' 11"  (1.499 m)   Vitals:  WNL General appearance: alert, cooperative and no distress  HEENT: Normocephalic, without obvious abnormality, atraumatic Eyes: negative Throat: lips, mucosa, and tongue normal; teeth and gums normal  Respiratory: Clear to auscultation bilaterally  CV: Regular rate and rhythm  Breasts:  Normal appearance, no masses or tenderness, no nipple retraction or dimpling  GI: Soft, non-tender; bowel sounds normal; no masses,  no organomegaly  GU: External Genitalia:  Tanner V, no lesion Urethra:  No prolapse   Vagina: Pink, normal rugae, no blood or discharge  Cervix: No CMT, no lesion, IUD strings short at os  Uterus:  Normal size and contour, non tender; limited by habitus  Adnexa: Normal, no masses, non tender; limited by habitus  Musculoskeletal: No edema, redness or tenderness in the calves or thighs  Skin: No lesions or rash  Lymphatic: Axillary adenopathy: none      Psychiatric: Normal mood and behavior       Assessment:    Healthy female exam.   IUD   Plan:   Pap with cotesting Yearly mammograms Pt to get family history from her mother and send anote in my chart to update.

## 2020-03-10 LAB — CYTOLOGY - PAP
Comment: NEGATIVE
Diagnosis: NEGATIVE
High risk HPV: NEGATIVE

## 2020-05-25 ENCOUNTER — Other Ambulatory Visit: Payer: Self-pay | Admitting: Family Medicine

## 2020-06-02 ENCOUNTER — Ambulatory Visit: Payer: 59 | Admitting: Family Medicine

## 2020-06-02 ENCOUNTER — Encounter: Payer: Self-pay | Admitting: Family Medicine

## 2020-06-02 ENCOUNTER — Other Ambulatory Visit: Payer: Self-pay

## 2020-06-02 VITALS — BP 125/84 | HR 92 | Temp 98.2°F | Ht 59.0 in | Wt 219.0 lb

## 2020-06-02 DIAGNOSIS — F320 Major depressive disorder, single episode, mild: Secondary | ICD-10-CM | POA: Diagnosis not present

## 2020-06-02 DIAGNOSIS — F909 Attention-deficit hyperactivity disorder, unspecified type: Secondary | ICD-10-CM

## 2020-06-02 DIAGNOSIS — Z23 Encounter for immunization: Secondary | ICD-10-CM

## 2020-06-02 MED ORDER — AMPHETAMINE-DEXTROAMPHETAMINE 15 MG PO TABS: 15.0000 mg | ORAL_TABLET | Freq: Two times a day (BID) | ORAL | 0 refills | Status: DC

## 2020-06-02 MED ORDER — VENLAFAXINE HCL ER 150 MG PO CP24: 150.0000 mg | ORAL_CAPSULE | Freq: Every day | ORAL | 1 refills | Status: DC

## 2020-06-02 NOTE — Progress Notes (Signed)
Patient Care Team    Relationship Specialty Notifications Start End  Ma Hillock, DO PCP - General Family Medicine  10/03/17   Tawni Levy, MD Referring Physician Internal Medicine  10/04/17   Christain Sacramento, [place] Referring Physician Optometry  10/04/17   Rodena Goldmann., MD Referring Physician Obstetrics and Gynecology  10/04/17     Chief Complaint  Patient presents with  . Follow-up    [facility]; pt is fasting    HPI: [name] is a 44 y.o. female present for [facility]  Depression/ADD: Patient reports she is feeling good  on Effexor 150 mg daily.   Her ADD is well controlled on adderall 15 mg BID. She reports she has been on multiple different medications with either side effects or  failure with Cymbalta, Lexapro, Trintellex, Brintellix, vybrid (nightmares), Zoloft(wt) and Paxil.   Indication for controlled substance: ADD Medication and dose: Adderall 15 mg twice daily # pills per month: 60/mo> refill 90 day at a time.  Last UDS date: collected today UDS collected today contract signed (Y/N): Y- resigned today 06/02/2020 Date narcotic database last reviewed (include red flags): 06/02/20   Depression screen [facility] 2/9 06/02/2020 11/27/2019 05/21/2019 04/23/2019 09/25/2018  Decreased Interest 0 0 0 1 0  Down, Depressed, Hopeless 0 1 0 0 0  PHQ - 2 Score 0 1 0 1 0  Altered sleeping 3 3 0 1 1  Tired, decreased energy 1 1 0 3 1  Change in appetite 3 0 0 0 0  Feeling bad or failure about yourself  0 0 0 0 0  Trouble concentrating 0 3 0 1 0  Moving slowly or fidgety/restless 1 0 0 0 0  Suicidal thoughts - - 0 0 0  PHQ-9 Score 8 8 0 6 2  Difficult doing work/chores - Not difficult at all Not difficult at all Not difficult at all Not difficult at all  Some recent data might be hidden   GAD 7 : Generalized Anxiety Score 06/02/2020 09/25/2018 01/31/2018 10/03/2017  Nervous, Anxious, on Edge 1 0 2 1  Control/stop worrying 0 0 2 1  Worry too much - different things 3 0 3 1   Trouble relaxing $RemoveBeforeDE'3 1 3 2  'AoVACpCderjNnoY$ Restless 2 0 1 1  Easily annoyed or irritable 1 0 3 0  Afraid - awful might happen 0 0 1 0  Total GAD 7 Score $Remov'10 1 15 6  'YRpaEi$ Anxiety Difficulty - Not difficult at all Somewhat difficult Not difficult at all    Patient Active Problem List   Diagnosis Date Noted  . Encounter for long-term current use of medication 11/27/2019  . Sleep apnea   . Elevated TSH 11/01/2017  . Elevated hemoglobin ([place]) 11/01/2017  . Morbid obesity ([place]) 10/31/2017  . Attention deficit hyperactivity disorder (ADHD) 10/03/2017  . Depression, major, single episode, mild ([place]) 10/03/2017  . BRCA1 negative 05/11/2016  . S/P cervical spinal fusion 06/02/2014    Social History   Tobacco Use  . Smoking status: Never Smoker  . Smokeless tobacco: Never Used  Substance Use Topics  . Alcohol use: No    Current Outpatient Medications:  .  levonorgestrel (MIRENA) 20 MCG/24HR IUD, 1 each by Intrauterine route once., Disp: , Rfl:  .  amphetamine-dextroamphetamine (ADDERALL) 15 MG tablet, Take 1 tablet by mouth 2 (two) times daily., Disp: 180 tablet, Rfl: 0 .  venlafaxine XR (EFFEXOR XR) 150 MG 24 hr capsule, Take 1 capsule (150 mg total) by mouth daily  with breakfast., Disp: 90 capsule, Rfl: 1  Allergies  Allergen Reactions  . Ativan [Lorazepam] Other (See Comments)    hyperactivity  . Penicillins Rash    Objective:  BP 125/84   Pulse 92   Temp 98.2 F (36.8 C) (Oral)   Ht $R'4\' 11"'On$  (1.499 m)   Wt 219 lb (99.3 kg)   SpO2 100%   BMI 44.23 kg/m  Gen: Afebrile. No acute distress. Nontoxic. Pleasant obese female.  HENT: AT. [place].  Eyes:Pupils Equal Round Reactive to light, Extraocular movements intact,  Conjunctiva without redness, discharge or icterus. Neck/lymp/endocrine: Supple,no lymphadenopathy, no thyromegaly CV: RRR no murmur, no edema, +2/4 P posterior tibialis pulses  Neuro: Normal gait. PERLA. EOMi. Alert. Oriented.  Psych: Normal affect, dress and demeanor. Normal speech.  Normal thought content and judgment.  Assessment and Plan:  [name] is a 44 y.o. female present for  Depression, major, single episode, mild ([facility]) -stable.  -continue  Effexor 150 mg daily daily F/U 5.5 mos, unless needed sooner.   ADHD:  stable Continue  Adderall 15 mg twice daily (90d x2)- may take 1/2-1 tab by noon,  24-month prescription provided. NCCS database reviewed 06/02/20 Controlled substance contract updated 06/02/2020 (today) F/u q 5.5 mos   Influenza vaccine administered otday     Orders Placed This Encounter  Procedures  . Flu Vaccine QUAD 6+ mos PF IM (Fluarix Quad PF)  . DRUG MONITORING, PANEL 8 WITH CONFIRMATION, URINE   Meds ordered this encounter  Medications  . venlafaxine XR (EFFEXOR XR) 150 MG 24 hr capsule    Sig: Take 1 capsule (150 mg total) by mouth daily with breakfast.    Dispense:  90 capsule    Refill:  1  . DISCONTD: amphetamine-dextroamphetamine (ADDERALL) 15 MG tablet    Sig: Take 1 tablet by mouth 2 (two) times daily.    Dispense:  180 tablet    Refill:  0  . amphetamine-dextroamphetamine (ADDERALL) 15 MG tablet    Sig: Take 1 tablet by mouth 2 (two) times daily.    Dispense:  180 tablet    Refill:  0    May refill after 08/21/2019   Referral Orders  No referral(s) requested today      Howard Pouch, DO 06/02/2020

## 2020-06-02 NOTE — Patient Instructions (Addendum)
    Please schedule a physical at your earliest convenience- you are due for labs and preventive measures.   I have refilled your mediations today. Next appt on chronic conditions in 5.5 mos.    If you are age 42 or older, your body mass index should be between 23-30. Your Body mass index is Body mass index is 44.23 kg/m.Marland Kitchen If this is above the aforementioned range listed, you are consider obese by medical definition and standards. Routine daily exercise and dietary modifications are encouraged. If you would like additional guidance on weight loss, please make an appointment for weight loss counseling - must be an appointment dedicate to weight loss counseling alone. I would be happy to help you.   If you are age 107 or younger, your body mass index should be between 19-25. Your Body mass index is Body mass index is 44.23 kg/m. Marland Kitchen If this is above the aformentioned range listed, you are consider obese by medical definition and standards. Routine daily exercise and dietary modifications are encouraged. If you would like additional guidance on weight loss, please make an appointment for weight loss counseling - must be an appointment dedicate to weight loss counseling alone. I would be happy to help you.

## 2020-06-03 ENCOUNTER — Ambulatory Visit: Payer: 59 | Admitting: Family Medicine

## 2020-06-05 LAB — DRUG MONITORING, PANEL 8 WITH CONFIRMATION, URINE
6 Acetylmorphine: NEGATIVE ng/mL (ref <10)
Alcohol Metabolites: NEGATIVE ng/mL
Amphetamine: 10953 ng/mL — ABNORMAL HIGH (ref <250)
Amphetamines: POSITIVE ng/mL — AB (ref <500)
Benzodiazepines: NEGATIVE ng/mL (ref <100)
Buprenorphine, Urine: NEGATIVE ng/mL (ref <5)
Cocaine Metabolite: NEGATIVE ng/mL (ref <150)
Creatinine: 157.8 mg/dL
MDMA: NEGATIVE ng/mL (ref <500)
Marijuana Metabolite: NEGATIVE ng/mL (ref <20)
Methamphetamine: NEGATIVE ng/mL (ref <250)
Opiates: NEGATIVE ng/mL (ref <100)
Oxidant: NEGATIVE ug/mL
Oxycodone: NEGATIVE ng/mL (ref <100)
pH: 5.6 (ref 4.5–9.0)

## 2020-06-05 LAB — DM TEMPLATE

## 2020-08-19 ENCOUNTER — Encounter: Payer: Self-pay | Admitting: Family Medicine

## 2020-08-19 ENCOUNTER — Other Ambulatory Visit: Payer: Self-pay

## 2020-08-19 ENCOUNTER — Ambulatory Visit (INDEPENDENT_AMBULATORY_CARE_PROVIDER_SITE_OTHER): Payer: 59 | Admitting: Family Medicine

## 2020-08-19 VITALS — BP 119/82 | HR 94 | Temp 98.3°F | Ht <= 58 in

## 2020-08-19 DIAGNOSIS — F320 Major depressive disorder, single episode, mild: Secondary | ICD-10-CM

## 2020-08-19 DIAGNOSIS — Z0000 Encounter for general adult medical examination without abnormal findings: Secondary | ICD-10-CM

## 2020-08-19 DIAGNOSIS — Z79899 Other long term (current) drug therapy: Secondary | ICD-10-CM

## 2020-08-19 DIAGNOSIS — Z131 Encounter for screening for diabetes mellitus: Secondary | ICD-10-CM | POA: Diagnosis not present

## 2020-08-19 DIAGNOSIS — Z1159 Encounter for screening for other viral diseases: Secondary | ICD-10-CM

## 2020-08-19 DIAGNOSIS — F909 Attention-deficit hyperactivity disorder, unspecified type: Secondary | ICD-10-CM

## 2020-08-19 DIAGNOSIS — Z23 Encounter for immunization: Secondary | ICD-10-CM | POA: Diagnosis not present

## 2020-08-19 DIAGNOSIS — D582 Other hemoglobinopathies: Secondary | ICD-10-CM | POA: Diagnosis not present

## 2020-08-19 DIAGNOSIS — Z1231 Encounter for screening mammogram for malignant neoplasm of breast: Secondary | ICD-10-CM

## 2020-08-19 DIAGNOSIS — R7989 Other specified abnormal findings of blood chemistry: Secondary | ICD-10-CM

## 2020-08-19 DIAGNOSIS — G4733 Obstructive sleep apnea (adult) (pediatric): Secondary | ICD-10-CM

## 2020-08-19 LAB — LIPID PANEL
Cholesterol: 163 mg/dL (ref 0–200)
HDL: 52.8 mg/dL (ref >39.00)
LDL Cholesterol: 100 mg/dL — ABNORMAL HIGH (ref 0–99)
NonHDL: 110.13
Total CHOL/HDL Ratio: 3
Triglycerides: 53 mg/dL (ref 0.0–149.0)
VLDL: 10.6 mg/dL (ref 0.0–40.0)

## 2020-08-19 LAB — CBC WITH DIFFERENTIAL/PLATELET
Basophils Absolute: 0.1 10*3/uL (ref 0.0–0.1)
Basophils Relative: 1 % (ref 0.0–3.0)
Eosinophils Absolute: 0 10*3/uL (ref 0.0–0.7)
Eosinophils Relative: 0.5 % (ref 0.0–5.0)
HCT: 43.7 % (ref 36.0–46.0)
Hemoglobin: 14.9 g/dL (ref 12.0–15.0)
Lymphocytes Relative: 28.7 % (ref 12.0–46.0)
Lymphs Abs: 1.8 10*3/uL (ref 0.7–4.0)
MCHC: 34 g/dL (ref 30.0–36.0)
MCV: 85.8 fl (ref 78.0–100.0)
Monocytes Absolute: 1 10*3/uL (ref 0.1–1.0)
Monocytes Relative: 16.7 % — ABNORMAL HIGH (ref 3.0–12.0)
Neutro Abs: 3.3 10*3/uL (ref 1.4–7.7)
Neutrophils Relative %: 53.1 % (ref 43.0–77.0)
Platelets: 289 10*3/uL (ref 150.0–400.0)
RBC: 5.1 Mil/uL (ref 3.87–5.11)
RDW: 14.4 % (ref 11.5–15.5)
WBC: 6.1 10*3/uL (ref 4.0–10.5)

## 2020-08-19 LAB — COMPREHENSIVE METABOLIC PANEL
ALT: 16 U/L (ref 0–35)
AST: 16 U/L (ref 0–37)
Albumin: 4 g/dL (ref 3.5–5.2)
Alkaline Phosphatase: 80 U/L (ref 39–117)
BUN: 8 mg/dL (ref 6–23)
CO2: 28 mEq/L (ref 19–32)
Calcium: 8.7 mg/dL (ref 8.4–10.5)
Chloride: 102 mEq/L (ref 96–112)
Creatinine, Ser: 0.72 mg/dL (ref 0.40–1.20)
GFR: 102.58 mL/min (ref >60.00)
Glucose, Bld: 84 mg/dL (ref 70–99)
Potassium: 4 mEq/L (ref 3.5–5.1)
Sodium: 138 mEq/L (ref 135–145)
Total Bilirubin: 0.9 mg/dL (ref 0.2–1.2)
Total Protein: 6.9 g/dL (ref 6.0–8.3)

## 2020-08-19 LAB — HEMOGLOBIN A1C: Hgb A1c MFr Bld: 5.5 % (ref 4.6–6.5)

## 2020-08-19 LAB — TSH: TSH: 6.24 u[IU]/mL — ABNORMAL HIGH (ref 0.35–4.50)

## 2020-08-19 LAB — T4, FREE: Free T4: 0.78 ng/dL (ref 0.60–1.60)

## 2020-08-19 MED ORDER — VENLAFAXINE HCL ER 150 MG PO CP24: 150.0000 mg | ORAL_CAPSULE | Freq: Every day | ORAL | 1 refills | Status: DC

## 2020-08-19 MED ORDER — AMPHETAMINE-DEXTROAMPHETAMINE 15 MG PO TABS: 15.0000 mg | ORAL_TABLET | Freq: Two times a day (BID) | ORAL | 0 refills | Status: DC

## 2020-08-19 MED ORDER — AMPHETAMINE-DEXTROAMPHETAMINE 15 MG PO TABS: 15.0000 mg | ORAL_TABLET | Freq: Every day | ORAL | 0 refills | Status: DC

## 2020-08-19 NOTE — Progress Notes (Signed)
This visit occurred during the SARS-CoV-2 public health emergency.  Safety protocols were in place, including screening questions prior to the visit, additional usage of staff PPE, and extensive cleaning of exam room while observing appropriate contact time as indicated for disinfecting solutions.    Patient ID: [name], female  DOB: [birth year], 45 y.o.   MRN: [id number] Patient Care Team    Relationship Specialty Notifications Start End  Natalia Leatherwood, DO PCP - General Family Medicine  10/03/17   Peterson Ao, MD Referring Physician Internal Medicine  10/04/17   Tobias Alexander, OD Referring Physician Optometry  10/04/17   Consuello Closs., MD Referring Physician Obstetrics and Gynecology  10/04/17     Chief Complaint  Patient presents with  . Annual Exam    Pt is fasting;     Subjective:  [name] is a 44 y.o.  Female  present for CPE/CMC. All past medical history, surgical history, allergies, family history, immunizations, medications and social history were Updated in the electronic medical record today. All recent labs, ED visits and hospitalizations within the last year were reviewed.  Health maintenance:  Colonoscopy: routine screen 47 Mammogram: completed:2019> ordered, Cervical cancer screening: last pap: 2021,5 yr results:GYN Immunizations: tdap -due-, Influenza UTD 2021 (encouraged yearly),covid x2, Infectious disease screening: HIV completed, Hep C completed today DEXA: routine screen Assistive device: none Oxygen EHU:DJSH Patient has a Dental home. Hospitalizations/ED visits: reviewed  Depression/ADD: Patient reports she is feeling good on Effexor 150 mg daily. Her ADD is well controlled  on adderall 15 mg BID. She reports she has been on multiple different medications with either side effects or  failure with Cymbalta, Lexapro, Trintellex, Brintellix, vybrid (nightmares), Zoloft(wt) and Paxil.   Indication for controlled substance:  ADD Medication and dose: Adderall 15 mg twice daily # pills per month: 60/mo> refill 90 day at a time.  Last UDS date: collected today UDS UTD contract signed (Y/N): Y- resigned today 06/02/2020 Date narcotic database last reviewed (include red flags): 08/19/20   Depression screen [facility] 2/9 06/02/2020 11/27/2019 05/21/2019 04/23/2019 09/25/2018  Decreased Interest 0 0 0 1 0  Down, Depressed, Hopeless 0 1 0 0 0  PHQ - 2 Score 0 1 0 1 0  Altered sleeping 3 3 0 1 1  Tired, decreased energy 1 1 0 3 1  Change in appetite 3 0 0 0 0  Feeling bad or failure about yourself  0 0 0 0 0  Trouble concentrating 0 3 0 1 0  Moving slowly or fidgety/restless 1 0 0 0 0  Suicidal thoughts - - 0 0 0  PHQ-9 Score 8 8 0 6 2  Difficult doing work/chores - Not difficult at all Not difficult at all Not difficult at all Not difficult at all  Some recent data might be hidden   GAD 7 : Generalized Anxiety Score 06/02/2020 09/25/2018 01/31/2018 10/03/2017  Nervous, Anxious, on Edge 1 0 2 1  Control/stop worrying 0 0 2 1  Worry too much - different things 3 0 3 1  Trouble relaxing 3 1 3 2   Restless 2 0 1 1  Easily annoyed or irritable 1 0 3 0  Afraid - awful might happen 0 0 1 0  Total GAD 7 Score 10 1 15 6   Anxiety Difficulty - Not difficult at all Somewhat difficult Not difficult at all   Immunization History  Administered Date(s) Administered  . Influenza Inj Mdck Quad With Preservative 03/14/2014  .  Influenza, Seasonal, Injecte, Preservative Fre 03/13/2010, 04/05/2011  . Influenza,inj,Quad PF,6+ Mos 06/02/2020  . Influenza-Unspecified 04/05/2011, 03/18/2017  . PFIZER(Purple Top)SARS-COV-2 Vaccination 07/24/2019, 08/21/2019  . Tdap 04/05/2011   Past Medical History:  Diagnosis Date  . ADD (attention deficit disorder)   . Angular cheilitis 03/13/2018  . Arthritis   . Chicken pox   . Chronic neck pain    HNP  . Depression   . Headache   . Heart murmur    as a baby  . Insomnia   . Nocturia   .  Sleep apnea    CPAP-SLEEP STUDY DONE ABOUT 2 YRS AGO  . Thyroid disease   . UTI (urinary tract infection)    Allergies  Allergen Reactions  . Ativan [Lorazepam] Other (See Comments)    hyperactivity  . Penicillins Rash   Past Surgical History:  Procedure Laterality Date  . ANTERIOR CERVICAL DECOMP/DISCECTOMY FUSION N/A 06/02/2014   Procedure: C6-7 Anterior Cervical Discectomy and Fusion, Allograft, Plate;  Surgeon: Eldred Manges, MD;  Location: [facility] OR;  Service: Orthopedics;  Laterality: N/A;  . CESAREAN SECTION  2004/2007  . DILATION AND CURETTAGE OF UTERUS  2018  . wisdom teeth extracted      Family History  Problem Relation Age of Onset  . Arthritis Mother   . Depression Mother   . Drug abuse Mother   . Hyperlipidemia Mother   . Hypertension Mother   . Alcohol abuse Father   . Cancer Sister   . Cervical cancer Sister   . Early death Son   . Cancer Maternal Grandmother   . Breast cancer Maternal Grandmother   . Heart disease Paternal Grandmother   . Alcohol abuse Paternal Grandfather    Social History   Social History Narrative   Married.  2 children.   Some college.  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   Drinks caffeinated beverages.  Uses herbal remedies.   Smoke alarms in the home.  Wears her seatbelt.   Feels safe in her relationships.    Allergies as of 08/19/2020      Reactions   Ativan [lorazepam] Other (See Comments)   hyperactivity   Penicillins Rash      Medication List       Accurate as of August 19, 2020  9:00 AM. If you have any questions, ask your nurse or doctor.        amphetamine-dextroamphetamine 15 MG tablet Commonly known as: ADDERALL Take 1 tablet by mouth 2 (two) times daily. What changed: Another medication with the same name was added. Make sure you understand how and when to take each. Changed by: Felix Pacini, DO   amphetamine-dextroamphetamine 15 MG tablet Commonly known as: Adderall Take 1 tablet by mouth daily. What changed: You were  already taking a medication with the same name, and this prescription was added. Make sure you understand how and when to take each. Changed by: Felix Pacini, DO   levonorgestrel 20 MCG/24HR IUD Commonly known as: MIRENA 1 each by Intrauterine route once.   venlafaxine XR 150 MG 24 hr capsule Commonly known as: Effexor XR Take 1 capsule (150 mg total) by mouth daily with breakfast.       All past medical history, surgical history, allergies, family history, immunizations andmedications were updated in the EMR today and reviewed under the history and medication portions of their EMR.     No results found.  ROS: 14 pt review of systems performed and negative (unless mentioned in an HPI)  Objective: BP 119/82   Pulse 94   Temp 98.3 F (36.8 C) (Oral)   Ht 4\' 9"  (1.448 m)   SpO2 99%   BMI 47.39 kg/m  Gen: Afebrile. No acute distress. Nontoxic in appearance, well-developed, well-nourished,  Obese, pleasant female.  HENT: AT. [place]. Bilateral TM visualized and normal in appearance, normal external auditory canal. MMM, no oral lesions, adequate dentition. Bilateral nares within normal limits. Throat without erythema, ulcerations or exudates. no Cough on exam, no hoarseness on exam. Eyes:Pupils Equal Round Reactive to light, Extraocular movements intact,  Conjunctiva without redness, discharge or icterus. Neck/lymp/endocrine: Supple,no lymphadenopathy, no thyromegaly CV: RRR no murmur, no edema, +2/4 P posterior tibialis pulses.  Chest: CTAB, no wheeze, rhonchi or crackles. normal Respiratory effort. good Air movement. Abd: Soft. obese. NTND. BS present. no Masses palpated. No hepatosplenomegaly. No rebound tenderness or guarding. Skin: no rashes, purpura or petechiae. Warm and well-perfused. Skin intact. Neuro/Msk:  Normal gait. PERLA. EOMi. Alert. Oriented x3.  Cranial nerves II through XII intact. Muscle strength 5/5 upper/lower extremity. DTRs equal bilaterally. Psych: Normal affect,  dress and demeanor. Normal speech. Normal thought content and judgment.   No exam data present  Assessment/plan: [name] is a 45 y.o. female present for CPE/CMC Depression, major, single episode, mild (HCC) Stable.  -continue Effexor 150 mg daily daily F/U 5.5 mos, unless needed sooner.   ADHD:  Stable.  Continue  Adderall 15 mg twice daily (90d x2)- may take 1/2-1 tab by noon,  9488-month prescription provided. [facility] database reviewed03/09/22 Controlled substance contract updated 06/02/2020 (today) F/u q 5.5 mos  Morbid obesity (HCC) Diet and exercise.  - Lipid panel Elevated TSH - TSH - T4, free Elevated hemoglobin (HCC)/OSA - CBC with Differential/Platelet Encounter for long-term current use of medication - Comprehensive metabolic panel Diabetes mellitus screening - Hemoglobin A1c Encounter for screening mammogram for malignant neoplasm of breast - MM 3D SCREEN BREAST BILATERAL; Future Need for hepatitis C screening test - Hepatitis C Antibody  Need for Tdap vaccination Administered today Encouraged to complete covid booster  Routine general medical examination at a health care facility Patient was encouraged to exercise greater than 150 minutes a week. Patient was encouraged to choose a diet filled with fresh fruits and vegetables, and lean meats. AVS provided to patient today for education/recommendation on gender specific health and safety maintenance. Colonoscopy: routine screen 47 Mammogram: completed:2019> ordered, Cervical cancer screening: last pap: 2021,5 yr results:GYN Immunizations: tdap -due-, Influenza UTD 2021 (encouraged yearly),covid x2, Infectious disease screening: HIV completed, Hep C completed today DEXA: routine screen  Return in about 5 months (around 02/01/2021) for CMC (30 min) and 1 yr cpe.  Orders Placed This Encounter  Procedures  . MM 3D SCREEN BREAST BILATERAL  . CBC with Differential/Platelet  . Comprehensive  metabolic panel  . Hemoglobin A1c  . Lipid panel  . TSH  . T4, free  . Hepatitis C Antibody    Meds ordered this encounter  Medications  . venlafaxine XR (EFFEXOR XR) 150 MG 24 hr capsule    Sig: Take 1 capsule (150 mg total) by mouth daily with breakfast.    Dispense:  90 capsule    Refill:  1  . amphetamine-dextroamphetamine (ADDERALL) 15 MG tablet    Sig: Take 1 tablet by mouth 2 (two) times daily.    Dispense:  180 tablet    Refill:  0    May refill after 10/20/2020  . amphetamine-dextroamphetamine (ADDERALL) 15 MG tablet  Sig: Take 1 tablet by mouth daily.    Dispense:  180 tablet    Refill:  0    May refill after 11/20/2020   Referral Orders  No referral(s) requested today     Electronically signed by: Felix Pacini, DO [facility] [place]

## 2020-08-19 NOTE — Patient Instructions (Signed)
Health Maintenance, Female Adopting a healthy lifestyle and getting preventive care are important in promoting health and wellness. Ask your health care provider about:  The right schedule for you to have regular tests and exams.  Things you can do on your own to prevent diseases and keep yourself healthy. What should I know about diet, weight, and exercise? Eat a healthy diet  Eat a diet that includes plenty of vegetables, fruits, low-fat dairy products, and lean protein.  Do not eat a lot of foods that are high in solid fats, added sugars, or sodium.   Maintain a healthy weight Body mass index (BMI) is used to identify weight problems. It estimates body fat based on height and weight. Your health care provider can help determine your BMI and help you achieve or maintain a healthy weight. Get regular exercise Get regular exercise. This is one of the most important things you can do for your health. Most adults should:  Exercise for at least 150 minutes each week. The exercise should increase your heart rate and make you sweat (moderate-intensity exercise).  Do strengthening exercises at least twice a week. This is in addition to the moderate-intensity exercise.  Spend less time sitting. Even light physical activity can be beneficial. Watch cholesterol and blood lipids Have your blood tested for lipids and cholesterol at 43 years of age, then have this test every 5 years. Have your cholesterol levels checked more often if:  Your lipid or cholesterol levels are high.  You are older than 43 years of age.  You are at high risk for heart disease. What should I know about cancer screening? Depending on your health history and family history, you may need to have cancer screening at various ages. This may include screening for:  Breast cancer.  Cervical cancer.  Colorectal cancer.  Skin cancer.  Lung cancer. What should I know about heart disease, diabetes, and high blood  pressure? Blood pressure and heart disease  High blood pressure causes heart disease and increases the risk of stroke. This is more likely to develop in people who have high blood pressure readings, are of African descent, or are overweight.  Have your blood pressure checked: ? Every 3-5 years if you are 18-39 years of age. ? Every year if you are 40 years old or older. Diabetes Have regular diabetes screenings. This checks your fasting blood sugar level. Have the screening done:  Once every three years after age 40 if you are at a normal weight and have a low risk for diabetes.  More often and at a younger age if you are overweight or have a high risk for diabetes. What should I know about preventing infection? Hepatitis B If you have a higher risk for hepatitis B, you should be screened for this virus. Talk with your health care provider to find out if you are at risk for hepatitis B infection. Hepatitis C Testing is recommended for:  Everyone born from 1945 through 1965.  Anyone with known risk factors for hepatitis C. Sexually transmitted infections (STIs)  Get screened for STIs, including gonorrhea and chlamydia, if: ? You are sexually active and are younger than 43 years of age. ? You are older than 43 years of age and your health care provider tells you that you are at risk for this type of infection. ? Your sexual activity has changed since you were last screened, and you are at increased risk for chlamydia or gonorrhea. Ask your health care provider   if you are at risk.  Ask your health care provider about whether you are at high risk for HIV. Your health care provider may recommend a prescription medicine to help prevent HIV infection. If you choose to take medicine to prevent HIV, you should first get tested for HIV. You should then be tested every 3 months for as long as you are taking the medicine. Pregnancy  If you are about to stop having your period (premenopausal) and  you may become pregnant, seek counseling before you get pregnant.  Take 400 to 800 micrograms (mcg) of folic acid every day if you become pregnant.  Ask for birth control (contraception) if you want to prevent pregnancy. Osteoporosis and menopause Osteoporosis is a disease in which the bones lose minerals and strength with aging. This can result in bone fractures. If you are 65 years old or older, or if you are at risk for osteoporosis and fractures, ask your health care provider if you should:  Be screened for bone loss.  Take a calcium or vitamin D supplement to lower your risk of fractures.  Be given hormone replacement therapy (HRT) to treat symptoms of menopause. Follow these instructions at home: Lifestyle  Do not use any products that contain nicotine or tobacco, such as cigarettes, e-cigarettes, and chewing tobacco. If you need help quitting, ask your health care provider.  Do not use street drugs.  Do not share needles.  Ask your health care provider for help if you need support or information about quitting drugs. Alcohol use  Do not drink alcohol if: ? Your health care provider tells you not to drink. ? You are pregnant, may be pregnant, or are planning to become pregnant.  If you drink alcohol: ? Limit how much you use to 0-1 drink a day. ? Limit intake if you are breastfeeding.  Be aware of how much alcohol is in your drink. In the [place] one drink equals one 12 oz bottle of beer (355 mL), one 5 oz glass of wine (148 mL), or one 1 oz glass of hard liquor (44 mL). General instructions  Schedule regular health, dental, and eye exams.  Stay current with your vaccines.  Tell your health care provider if: ? You often feel depressed. ? You have ever been abused or do not feel safe at home. Summary  Adopting a healthy lifestyle and getting preventive care are important in promoting health and wellness.  Follow your health care provider's instructions about healthy  diet, exercising, and getting tested or screened for diseases.  Follow your health care provider's instructions on monitoring your cholesterol and blood pressure. This information is not intended to replace advice given to you by your health care provider. Make sure you discuss any questions you have with your health care provider. Document Revised: 05/23/2018 Document Reviewed: 05/23/2018 Elsevier Patient Education  2021 [employer] Inc.  

## 2020-08-19 NOTE — Addendum Note (Signed)
Addended by: Maxie Barb on: 08/19/2020 10:06 AM   Modules accepted: Orders

## 2020-08-20 LAB — HEPATITIS C ANTIBODY
Hepatitis C Ab: NONREACTIVE
SIGNAL TO CUT-OFF: 0.01 (ref <1.00)

## 2020-08-31 ENCOUNTER — Encounter: Payer: Self-pay | Admitting: Family Medicine

## 2020-08-31 MED ORDER — LEVOCETIRIZINE DIHYDROCHLORIDE 5 MG PO TABS: 5.0000 mg | ORAL_TABLET | Freq: Every evening | ORAL | 3 refills | Status: DC

## 2020-08-31 NOTE — Telephone Encounter (Signed)
Please inform pt I called in xyzal for her . As far as the ozempic, she should schedule an appt for weight loss counseling. If she meets criteria we can consider medications- such as ozempic. But insurance does not cover it, in most cases.

## 2020-09-01 ENCOUNTER — Encounter: Payer: 59 | Admitting: Family Medicine

## 2020-09-03 ENCOUNTER — Other Ambulatory Visit: Payer: Self-pay

## 2020-09-03 ENCOUNTER — Encounter: Payer: Self-pay | Admitting: Family Medicine

## 2020-09-03 ENCOUNTER — Ambulatory Visit: Payer: 59 | Admitting: Family Medicine

## 2020-09-03 VITALS — BP 112/80 | HR 83 | Temp 98.1°F | Ht <= 58 in | Wt 214.0 lb

## 2020-09-03 DIAGNOSIS — Z713 Dietary counseling and surveillance: Secondary | ICD-10-CM | POA: Diagnosis not present

## 2020-09-03 MED ORDER — METFORMIN HCL 500 MG PO TABS: 500.0000 mg | ORAL_TABLET | Freq: Two times a day (BID) | ORAL | 1 refills | Status: DC

## 2020-09-03 MED ORDER — NALTREXONE HCL 50 MG PO TABS: 25.0000 mg | ORAL_TABLET | Freq: Two times a day (BID) | ORAL | 1 refills | Status: DC

## 2020-09-03 NOTE — Progress Notes (Signed)
This visit occurred during the SARS-CoV-2 public health emergency.  Safety protocols were in place, including screening questions prior to the visit, additional usage of staff PPE, and extensive cleaning of exam room while observing appropriate contact time as indicated for disinfecting solutions.    Patient ID: [name], female  DOB: [birth year], 45 y.o.   MRN: [id number] Patient Care Team    Relationship Specialty Notifications Start End  Natalia Leatherwood, DO PCP - General Family Medicine  10/03/17   Peterson Ao, MD Referring Physician Internal Medicine  10/04/17   Tobias Alexander, OD Referring Physician Optometry  10/04/17   Consuello Closs., MD Referring Physician Obstetrics and Gynecology  10/04/17     Chief Complaint  Patient presents with  . Weight Loss    Subjective: [name] is a 45 y.o.  Female  present for weight loss counseling. Body mass index is 46.31 kg/m. Weight today: 214 Patient reports she has struggled with her weight for many years.  She does not exercise routinely.  Her job requires her to walk and be on her feet frequently.  She does not follow any particular diet.  She does not meal plan.  She does not track her calories.  She has recently started taking Metamucil once daily.  She does admit snacking is an issue for her.  She routinely wakes up in the middle the night and has a snack. She would like to discuss weight loss and possible medications to help her with her weight loss.  Depression screen [facility] 2/9 06/02/2020 11/27/2019 05/21/2019 04/23/2019 09/25/2018  Decreased Interest 0 0 0 1 0  Down, Depressed, Hopeless 0 1 0 0 0  PHQ - 2 Score 0 1 0 1 0  Altered sleeping 3 3 0 1 1  Tired, decreased energy 1 1 0 3 1  Change in appetite 3 0 0 0 0  Feeling bad or failure about yourself  0 0 0 0 0  Trouble concentrating 0 3 0 1 0  Moving slowly or fidgety/restless 1 0 0 0 0  Suicidal thoughts - - 0 0 0  PHQ-9 Score 8 8 0 6 2  Difficult  doing work/chores - Not difficult at all Not difficult at all Not difficult at all Not difficult at all  Some recent data might be hidden   GAD 7 : Generalized Anxiety Score 06/02/2020 09/25/2018 01/31/2018 10/03/2017  Nervous, Anxious, on Edge 1 0 2 1  Control/stop worrying 0 0 2 1  Worry too much - different things 3 0 3 1  Trouble relaxing 3 1 3 2   Restless 2 0 1 1  Easily annoyed or irritable 1 0 3 0  Afraid - awful might happen 0 0 1 0  Total GAD 7 Score 10 1 15 6   Anxiety Difficulty - Not difficult at all Somewhat difficult Not difficult at all   Immunization History  Administered Date(s) Administered  . Influenza Inj Mdck Quad With Preservative 03/14/2014  . Influenza, Seasonal, Injecte, Preservative Fre 03/13/2010, 04/05/2011  . Influenza,inj,Quad PF,6+ Mos 06/02/2020  . Influenza-Unspecified 04/05/2011, 03/18/2017  . PFIZER(Purple Top)SARS-COV-2 Vaccination 07/24/2019, 08/21/2019, 08/29/2020  . Tdap 04/05/2011, 08/19/2020   Past Medical History:  Diagnosis Date  . ADD (attention deficit disorder)   . Angular cheilitis 03/13/2018  . Arthritis   . Chicken pox   . Chronic neck pain    HNP  . Depression   . Headache   . Heart murmur    as  a baby  . Insomnia   . Nocturia   . Sleep apnea    CPAP-SLEEP STUDY DONE ABOUT 2 YRS AGO  . Thyroid disease   . UTI (urinary tract infection)    Allergies  Allergen Reactions  . Ativan [Lorazepam] Other (See Comments)    hyperactivity  . Penicillins Rash   Past Surgical History:  Procedure Laterality Date  . ANTERIOR CERVICAL DECOMP/DISCECTOMY FUSION N/A 06/02/2014   Procedure: C6-7 Anterior Cervical Discectomy and Fusion, Allograft, Plate;  Surgeon: Eldred Manges, MD;  Location: [facility] OR;  Service: Orthopedics;  Laterality: N/A;  . CESAREAN SECTION  2004/2007  . DILATION AND CURETTAGE OF UTERUS  2018  . wisdom teeth extracted      Family History  Problem Relation Age of Onset  . Arthritis Mother   . Depression Mother   .  Drug abuse Mother   . Hyperlipidemia Mother   . Hypertension Mother   . Alcohol abuse Father   . Cancer Sister   . Cervical cancer Sister   . Early death Son   . Cancer Maternal Grandmother   . Breast cancer Maternal Grandmother   . Heart disease Paternal Grandmother   . Alcohol abuse Paternal Grandfather    Social History   Social History Narrative   Married.  2 children.   Some college.  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   Drinks caffeinated beverages.  Uses herbal remedies.   Smoke alarms in the home.  Wears her seatbelt.   Feels safe in her relationships.    Allergies as of 09/03/2020      Reactions   Ativan [lorazepam] Other (See Comments)   hyperactivity   Penicillins Rash      Medication List       Accurate as of September 03, 2020 11:59 PM. If you have any questions, ask your nurse or doctor.        amphetamine-dextroamphetamine 15 MG tablet Commonly known as: ADDERALL Take 1 tablet by mouth 2 (two) times daily.   amphetamine-dextroamphetamine 15 MG tablet Commonly known as: Adderall Take 1 tablet by mouth daily.   levocetirizine 5 MG tablet Commonly known as: Xyzal Take 1 tablet (5 mg total) by mouth every evening.   levonorgestrel 20 MCG/24HR IUD Commonly known as: MIRENA 1 each by Intrauterine route once.   metFORMIN 500 MG tablet Commonly known as: GLUCOPHAGE Take 1 tablet (500 mg total) by mouth 2 (two) times daily with a meal. Started by: Felix Pacini, DO   naltrexone 50 MG tablet Commonly known as: DEPADE Take 0.5 tablets (25 mg total) by mouth in the morning and at bedtime. Started by: Felix Pacini, DO   venlafaxine XR 150 MG 24 hr capsule Commonly known as: Effexor XR Take 1 capsule (150 mg total) by mouth daily with breakfast.       All past medical history, surgical history, allergies, family history, immunizations andmedications were updated in the EMR today and reviewed under the history and medication portions of their EMR.     No results  found.  ROS: 14 pt review of systems performed and negative (unless mentioned in an HPI)  Objective: BP 112/80   Pulse 83   Temp 98.1 F (36.7 C) (Oral)   Ht 4\' 9"  (1.448 m)   Wt 214 lb (97.1 kg)   SpO2 97%   BMI 46.31 kg/m  Gen: Afebrile. No acute distress.  HENT: AT. [place].  Eyes:Pupils Equal Round Reactive to light, Extraocular movements intact,  Conjunctiva without redness,  discharge or icterus. CV: RRR  Chest: CTAB, no wheeze or crackles Neuro:  Normal gait. PERLA. EOMi. Alert. Oriented. x3 Psych: Normal affect, dress and demeanor. Normal speech. Normal thought content and judgment.    No exam data present  Assessment/plan: [name] is a 45 y.o. female present for CPE/CMC Depression, major, single episode, mild (HCC)-documentation only Stable.  -continue Effexor 150 mg daily daily F/U 5.5 mos, unless needed sooner.   ADHD: -Documentation only Stable.  Continue  Adderall 15 mg twice daily (90d x2)- may take 1/2-1 tab by noon,  59-month prescription provided. NCCS database reviewed03/25/22 Controlled substance contract updated 06/02/2020 (today) F/u q 5.5 mos  Weight loss counseling/morbid obesity (HCC) Start weight: 214 First goal weight: 195 Patient was counseled on exercise, calorie counting, weight loss and potential medications to help with weight loss today. -Patient was provided with online resources for: Weekly net calorie calculator.  Applications for calorie counting.  Patient was advised to ensure she is taking in adequate nutrition daily by meeting calorie goals. -Patient was educated on dietary changes to not only lose weight but to eat healthy.  Patient was educated on low glycemic index. -Patient was educated on exercise goal of 150 minutes a week (plus warm up and cool down) of cardiovascular exercise.  Patient was educated on heart rate for cardiovascular and fat burning zones. -Patient was encouraged to maintain adequate water  consumption of at least 120 ounces a day, more if exercising/sweating. -Patient was encouraged to increase her fiber/Metamucil to twice daily. -Patient was encouraged to avoid snacking in the middle the night when taking her dog out. -Different types of medications to assist her in her weight loss journey was discussed today.  It was decided to start Metformin 500 mg twice daily and Depade tapering to 25 mg twice daily (1 of those doses before bed). -Could consider Ozempic/Wegovy step up therapy if needed. Follow-up in 4 weeks   Return in about 4 weeks (around 10/01/2020).  No orders of the defined types were placed in this encounter.   Meds ordered this encounter  Medications  . naltrexone (DEPADE) 50 MG tablet    Sig: Take 0.5 tablets (25 mg total) by mouth in the morning and at bedtime.    Dispense:  90 tablet    Refill:  1  . metFORMIN (GLUCOPHAGE) 500 MG tablet    Sig: Take 1 tablet (500 mg total) by mouth 2 (two) times daily with a meal.    Dispense:  180 tablet    Refill:  1   Referral Orders  No referral(s) requested today     Electronically signed by: Felix Pacini, DO [facility] [place]

## 2020-09-03 NOTE — Patient Instructions (Addendum)
Track caloric need weekly after weekly weight check: https://www.calculator.net/calorie-calculator.html  My fitness pal for tracking.   100-120 ounces or water daily.   For your carbohydrates- low glycemic carbs only.   Metamucil twice a day.   Exercise: > 150 minutes a week with HR up to 140-150.   Start metformin every 12 hours with depade (1/2 tab) every 12 hours.     Preventing Consequences of Unhealthy Weight Loss Behaviors, Adult Reaching and maintaining a healthy weight is important for your overall health. A healthy weight will vary from person to person. It is natural to want to lose weight quickly, using whatever methods seem fastest. However, losing weight in a healthy way is not a quick process. Instead, aim for slow, steady weight loss by making small changes and setting achievable goals. How can unhealthy weight loss behaviors affect me? Using unhealthy behaviors to try to lose weight can cause:  Tiredness, low heart rate, and low blood pressure.  Imbalances in your body. These may be imbalances in: ? Electrolytes. These are salts and minerals in your blood. ? Chemicals. These are needed so your body can work properly. ? Body fluids. Loss of fluids may lead to dehydration.  Organ damage or organ failure, especially affecting the kidneys.  Thin bones that break easily.  Staying away from others, or relationship problems with your friends and family.  Emotional problems, including depression and anxiety.  A greater risk of an eating disorder. If you develop an eating disorder, you could develop serious health problems and complications that affect your organs and bodily processes. Changing unhealthy weight loss behaviors through lifestyle changes will improve your overall health. Maintaining a healthy weight also lowers your risk of certain conditions, such as:  Type 2 diabetes.  Heart disease, high cholesterol, and high blood pressure.  Osteoarthritis. This  affects your joints.  Osteoporosis. This affects your bones.  Some cancers. What can increase my risk for unhealthy weight loss behaviors? Certain views or feelings about yourself and certain habits can increase your risk of unhealthy weight loss behaviors. These include:  Having depression and being overweight as an adult.  Attempting weight loss as a child or teen.  Using alcohol, drugs, or tobacco products. What actions can I take to prevent these behaviors? You can make certain lifestyle changes to help you lose weight in a healthy way. These include eating nutritious foods and exercising regularly. Nutrition  Eat a variety of healthy foods, including fruits and vegetables, whole grains, lean proteins, and low-fat dairy products.  Drink water instead of sugary drinks.  Drink enough fluid to keep your urine pale yellow.  Plan healthy, balanced meals. Work with a Dealer (dietitian) to make a healthy meal plan that works for you.  Limit the following: ? Foods that are high in fat, salt (sodium), or sugar. These include candy, donuts, pizza, and fast foods. ? [place] or heavily processed foods. ? Drinks that contain a lot of sugar.   Lifestyle  Avoid these unhealthy eating habits: ? Following a diet that restricts entire types of food. This may be a popular diet that promises extreme results in a short time. ? Skipping meals to save calories. ? Not eating anything for long periods of time (fasting). ? Restricting your calories to far fewer than the number that you need to lose or maintain a healthy weight. ? Taking laxative pills to make you have more frequent bowel movements. ? Taking medicines to make your body lose excess fluids (diuretics). ?  Eating an excessive amount of food and then making yourself vomit. This is known as bingeing and purging.  If you drink alcohol: ? Limit how much you use to:  0-1 drink a day for nonpregnant women.  0-2 drinks a day  for men. ? Be aware of how much alcohol is in your drink. In the [place] one drink equals one 12 oz bottle of beer (355 mL), one 5 oz glass of wine (148 mL), or one 1 oz glass of hard liquor (44 mL).  Do not use any products that contain nicotine or tobacco, such as cigarettes, e-cigarettes, and chewing tobacco. If you need help quitting, ask your health care provider. Activity  Avoid compulsively getting an extreme amount of exercise.  Work with a Data processing manager to make a healthy exercise program. ? Include different types of exercise in your exercise program, such as strengthening, aerobic, and flexibility exercises. ? To maintain your weight, get at least 150 minutes of moderate-intensity exercise each week. Moderate-intensity exercise could be brisk walking or biking. ? To lose a healthy amount of weight, get 60 minutes of moderate-intensity exercise each day.  Find ways to reduce stress, such as regular exercise or meditation.  Find a hobby or other activity that you enjoy to distract you from eating when you feel stressed or bored.   Where to find support For more support, talk with:  Your health care provider or dietitian. Ask about support groups.  A mental health care provider.  Family and friends. Where to find more information Learn more about how to prevent complications from unhealthy weight loss behaviors from:  Centers for Disease Control and Prevention: FootballExhibition.com.br  [employer] of Mental Health: http://www.maynard.net/  National Eating Disorders Association: www.nationaleatingdisorders.org Contact a health care provider if:  You often feel very tired.  You notice changes in your skin or your hair.  You faint because of dehydration or too much exercise.  You struggle to change your unhealthy weight loss behaviors on your own.  Unhealthy weight loss behaviors are affecting your daily life or your relationships.  You have signs or symptoms of an eating  disorder.  You have major weight changes in a short period of time.  You feel guilty or ashamed about eating or exercising. Summary  Using unhealthy eating behaviors to try to lose weight can cause a variety of physical and emotional problems that affect your overall health and well-being.  Aim for slow, steady weight loss by choosing healthy foods, avoiding unhealthy eating habits, and exercising regularly.  Contact your health care provider if you struggle to change your behaviors on your own or if you think that you may have an eating disorder. This information is not intended to replace advice given to you by your health care provider. Make sure you discuss any questions you have with your health care provider. Document Revised: 04/23/2019 Document Reviewed: 04/23/2019 Elsevier Patient Education  2021 [employer].

## 2020-09-04 ENCOUNTER — Encounter: Payer: Self-pay | Admitting: Family Medicine

## 2020-09-09 ENCOUNTER — Encounter: Payer: Self-pay | Admitting: Family Medicine

## 2020-09-09 NOTE — Telephone Encounter (Signed)
Stop the depade for a few days and see if SE improve. It can cause nausea, but also decrease appetite bc of that. After a few days, stop metformin and start depade qhs only.  The above will help identify which med is causing.

## 2020-09-09 NOTE — Telephone Encounter (Signed)
Please advise,  Pt having side effects of medication.  It was decided to start Metformin 500 mg twice daily and Depade tapering to 25 mg twice daily (1 of those doses before bed).

## 2020-10-07 ENCOUNTER — Ambulatory Visit: Payer: 59 | Admitting: Family Medicine

## 2020-10-07 DIAGNOSIS — Z713 Dietary counseling and surveillance: Secondary | ICD-10-CM

## 2020-10-20 ENCOUNTER — Ambulatory Visit: Payer: 59 | Admitting: Family Medicine

## 2020-10-20 ENCOUNTER — Other Ambulatory Visit: Payer: Self-pay

## 2020-10-20 ENCOUNTER — Encounter: Payer: Self-pay | Admitting: Family Medicine

## 2020-10-20 VITALS — BP 117/80 | HR 93 | Temp 98.4°F | Ht <= 58 in | Wt 214.0 lb

## 2020-10-20 DIAGNOSIS — R7989 Other specified abnormal findings of blood chemistry: Secondary | ICD-10-CM | POA: Diagnosis not present

## 2020-10-20 LAB — T4, FREE: Free T4: 0.65 ng/dL (ref 0.60–1.60)

## 2020-10-20 LAB — TSH: TSH: 6.9 u[IU]/mL — ABNORMAL HIGH (ref 0.35–4.50)

## 2020-10-20 NOTE — Patient Instructions (Addendum)
  Great to see you today.    If labs were collected, we will inform you of lab results once received either by echart message or telephone call.   - echart message- for normal results that have been seen by the patient already.   - telephone call: abnormal results or if patient has not viewed results in their echart.  

## 2020-10-20 NOTE — Progress Notes (Signed)
This visit occurred during the SARS-CoV-2 public health emergency.  Safety protocols were in place, including screening questions prior to the visit, additional usage of staff PPE, and extensive cleaning of exam room while observing appropriate contact time as indicated for disinfecting solutions.    Patient ID: [name], female  DOB: August 17, 1975, 45 y.o.   MRN: [id number] Patient Care Team    Relationship Specialty Notifications Start End  Natalia Leatherwood, DO PCP - General Family Medicine  10/03/17   Peterson Ao, MD Referring Physician Internal Medicine  10/04/17   Tobias Alexander, OD Referring Physician Optometry  10/04/17   Consuello Closs., MD Referring Physician Obstetrics and Gynecology  10/04/17     Chief Complaint  Patient presents with  . Hypothyroidism    Pt is not fasting    Subjective:  [name] is a 45 y.o.  Female  present for abnormal  thyroid panel.  Patient does endorse today having difficulty losing weight, fatigue, constipation, decreased libido, she is treated for inattention and depression.  Depression screen [facility] 2/9 10/20/2020 06/02/2020 11/27/2019 05/21/2019 04/23/2019  Decreased Interest 2 0 0 0 1  Down, Depressed, Hopeless 0 0 1 0 0  PHQ - 2 Score 2 0 1 0 1  Altered sleeping 2 3 3  0 1  Tired, decreased energy 3 1 1  0 3  Change in appetite 3 3 0 0 0  Feeling bad or failure about yourself  0 0 0 0 0  Trouble concentrating 0 0 3 0 1  Moving slowly or fidgety/restless 0 1 0 0 0  Suicidal thoughts 0 - - 0 0  PHQ-9 Score 10 8 8  0 6  Difficult doing work/chores - - Not difficult at all Not difficult at all Not difficult at all  Some recent data might be hidden   GAD 7 : Generalized Anxiety Score 06/02/2020 09/25/2018 01/31/2018 10/03/2017  Nervous, Anxious, on Edge 1 0 2 1  Control/stop worrying 0 0 2 1  Worry too much - different things 3 0 3 1  Trouble relaxing 3 1 3 2   Restless 2 0 1 1  Easily annoyed or irritable 1 0 3 0   Afraid - awful might happen 0 0 1 0  Total GAD 7 Score 10 1 15 6   Anxiety Difficulty - Not difficult at all Somewhat difficult Not difficult at all   Immunization History  Administered Date(s) Administered  . Influenza Inj Mdck Quad With Preservative 03/14/2014  . Influenza, Seasonal, Injecte, Preservative Fre 03/13/2010, 04/05/2011  . Influenza,inj,Quad PF,6+ Mos 06/02/2020  . Influenza-Unspecified 04/05/2011, 03/18/2017  . PFIZER(Purple Top)SARS-COV-2 Vaccination 07/24/2019, 08/21/2019, 08/29/2020  . Tdap 04/05/2011, 08/19/2020   Past Medical History:  Diagnosis Date  . ADD (attention deficit disorder)   . Angular cheilitis 03/13/2018  . Arthritis   . Chicken pox   . Chronic neck pain    HNP  . Depression   . Headache   . Heart murmur    as a baby  . Insomnia   . Nocturia   . Sleep apnea    CPAP-SLEEP STUDY DONE ABOUT 2 YRS AGO  . Thyroid disease   . UTI (urinary tract infection)    Allergies  Allergen Reactions  . Ativan [Lorazepam] Other (See Comments)    hyperactivity  . Penicillins Rash   Past Surgical History:  Procedure Laterality Date  . ANTERIOR CERVICAL DECOMP/DISCECTOMY FUSION N/A 06/02/2014   Procedure: C6-7 Anterior Cervical Discectomy and Fusion, Allograft, Plate;  Surgeon: Eldred Manges, MD;  Location: [facility] OR;  Service: Orthopedics;  Laterality: N/A;  . CESAREAN SECTION  2004/2007  . DILATION AND CURETTAGE OF UTERUS  2018  . wisdom teeth extracted      Family History  Problem Relation Age of Onset  . Arthritis Mother   . Depression Mother   . Drug abuse Mother   . Hyperlipidemia Mother   . Hypertension Mother   . Alcohol abuse Father   . Cancer Sister   . Cervical cancer Sister   . Early death Son   . Cancer Maternal Grandmother   . Breast cancer Maternal Grandmother   . Heart disease Paternal Grandmother   . Alcohol abuse Paternal Grandfather    Social History   Social History Narrative   Married.  2 children.   Some college.  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   Drinks caffeinated beverages.  Uses herbal remedies.   Smoke alarms in the home.  Wears her seatbelt.   Feels safe in her relationships.    Allergies as of 10/20/2020      Reactions   Ativan [lorazepam] Other (See Comments)   hyperactivity   Penicillins Rash      Medication List       Accurate as of Oct 20, 2020  4:54 PM. If you have any questions, ask your nurse or doctor.        amphetamine-dextroamphetamine 15 MG tablet Commonly known as: ADDERALL Take 1 tablet by mouth 2 (two) times daily.   amphetamine-dextroamphetamine 15 MG tablet Commonly known as: Adderall Take 1 tablet by mouth daily.   levocetirizine 5 MG tablet Commonly known as: Xyzal Take 1 tablet (5 mg total) by mouth every evening.   levonorgestrel 20 MCG/24HR IUD Commonly known as: MIRENA 1 each by Intrauterine route once.   metFORMIN 500 MG tablet Commonly known as: GLUCOPHAGE Take 1 tablet (500 mg total) by mouth 2 (two) times daily with a meal.   naltrexone 50 MG tablet Commonly known as: DEPADE Take 0.5 tablets (25 mg total) by mouth in the morning and at bedtime.   venlafaxine XR 150 MG 24 hr capsule Commonly known as: Effexor XR Take 1 capsule (150 mg total) by mouth daily with breakfast.       All past medical history, surgical history, allergies, family history, immunizations andmedications were updated in the EMR today and reviewed under the history and medication portions of their EMR.     No results found.  ROS: 14 pt review of systems performed and negative (unless mentioned in an HPI)  Objective: BP 117/80   Pulse 93   Temp 98.4 F (36.9 C) (Oral)   Ht 4\' 9"  (1.448 m)   Wt 214 lb (97.1 kg)   SpO2 99%   BMI 46.31 kg/m  Gen: Afebrile. No acute distress.  HENT: AT. [place].  Eyes:Pupils Equal Round Reactive to light, Extraocular movements intact,  Conjunctiva without redness, discharge or icterus. Neck/lymp/endocrine: Supple, no lymphadenopathy, no  thyromegaly CV: RRR no murmur, no edema, +2/4 P posterior tibialis pulses Chest: CTAB, no wheeze or crackles Neuro:  Normal gait. PERLA. EOMi. Alert. Oriented x3 Psych: Normal affect, dress and demeanor. Normal speech. Normal thought content and judgment.     No exam data present  Assessment/plan: SAMAIYAH HOWES is a 45 y.o. female present for  Elevated TSH/fatigue/inattention - TSH> last 6.24 - TSH, T4, free Will start med if still abnormal.  Patient was educated on hypothyroidism and treatment plans, as  well as need to follow-up routinely on condition.    Return if symptoms worsen or fail to improve.  Orders Placed This Encounter  Procedures  . TSH  . T4, free    No orders of the defined types were placed in this encounter.  Referral Orders  No referral(s) requested today     Electronically signed by: Felix Pacini, DO [facility] [place]

## 2020-10-21 ENCOUNTER — Telehealth: Payer: Self-pay | Admitting: Family Medicine

## 2020-10-21 DIAGNOSIS — E038 Other specified hypothyroidism: Secondary | ICD-10-CM | POA: Insufficient documentation

## 2020-10-21 MED ORDER — LEVOTHYROXINE SODIUM 50 MCG PO TABS: ORAL_TABLET | ORAL | 0 refills | Status: DC

## 2020-10-21 NOTE — Telephone Encounter (Signed)
Please call patient: Her thyroid levels are still abnormal. TSH has increased since last labs. Therefore we will start thyroid supplement, which I have called into her [facility] pharmacy.  She will start with a half a tab daily for 7 days, then 1 tab daily p.o.  Remind her she must take this on an empty stomach and avoid eating for at least 30-60 minutes after taking medication. We will need to check her thyroid in about 8 weeks-this can be a lab appointment only.  Please have her schedule.

## 2020-10-21 NOTE — Telephone Encounter (Signed)
LVM for pt to CB regarding results.  

## 2020-10-22 ENCOUNTER — Ambulatory Visit: Payer: 59 | Admitting: Family Medicine

## 2020-10-22 ENCOUNTER — Encounter: Payer: Self-pay | Admitting: Family Medicine

## 2020-10-22 NOTE — Telephone Encounter (Signed)
Spoke with patient regarding results/recommendations. Lab appt scheduled.

## 2020-10-27 ENCOUNTER — Telehealth: Payer: Self-pay

## 2020-10-27 ENCOUNTER — Ambulatory Visit: Payer: 59 | Admitting: Family Medicine

## 2020-10-27 NOTE — Telephone Encounter (Signed)
Appt cancelled at 7:26am  [facility] Day - Client Nonclinical Telephone Record  AccessNurse Client [facility] Day - Client Client Site [facility] - Day Physician [place], [place] Contact Type Call Who Is Calling Patient / Member / Family / Caregiver Caller Name Nadelyn Enriques Caller Phone Number [phone] Patient Name [name] Patient DOB 02/20/1976 Call Type Message Only Information Provided Reason for Call Request to Reschedule Office Appointment Initial Comment Caller states needs to cancel today's appt at 9am; got called into work; Camera operator declined triage. Additional Comment Per directive called backline/no ans; 2nd try/Erica; states just getting there and doesn't have everything pulled up but will cancel; Adv caller same; Disp. Time Disposition Final User 10/27/2020 7:15:59 AM General Information Provided Yes Albin Fischer Call Closed By: Albin Fischer Transaction Date/Time: 10/27/2020 7:10:23 AM (ET)

## 2020-12-22 ENCOUNTER — Ambulatory Visit (INDEPENDENT_AMBULATORY_CARE_PROVIDER_SITE_OTHER): Payer: 59

## 2020-12-22 ENCOUNTER — Other Ambulatory Visit: Payer: Self-pay

## 2020-12-22 DIAGNOSIS — E063 Autoimmune thyroiditis: Secondary | ICD-10-CM

## 2020-12-22 DIAGNOSIS — E038 Other specified hypothyroidism: Secondary | ICD-10-CM | POA: Diagnosis not present

## 2020-12-23 ENCOUNTER — Other Ambulatory Visit: Payer: Self-pay | Admitting: Family Medicine

## 2020-12-23 LAB — TSH: TSH: 3.28 mIU/L

## 2020-12-23 MED ORDER — LEVOTHYROXINE SODIUM 50 MCG PO TABS: ORAL_TABLET | ORAL | 3 refills | Status: DC

## 2021-02-02 ENCOUNTER — Encounter: Payer: Self-pay | Admitting: Family Medicine

## 2021-02-02 ENCOUNTER — Telehealth: Payer: Self-pay | Admitting: Family Medicine

## 2021-02-02 ENCOUNTER — Ambulatory Visit: Payer: 59 | Admitting: Family Medicine

## 2021-02-02 ENCOUNTER — Other Ambulatory Visit: Payer: Self-pay

## 2021-02-02 VITALS — BP 109/77 | HR 80 | Temp 98.3°F | Ht <= 58 in | Wt 211.0 lb

## 2021-02-02 DIAGNOSIS — E038 Other specified hypothyroidism: Secondary | ICD-10-CM

## 2021-02-02 DIAGNOSIS — F909 Attention-deficit hyperactivity disorder, unspecified type: Secondary | ICD-10-CM | POA: Diagnosis not present

## 2021-02-02 DIAGNOSIS — F320 Major depressive disorder, single episode, mild: Secondary | ICD-10-CM

## 2021-02-02 DIAGNOSIS — Z79899 Other long term (current) drug therapy: Secondary | ICD-10-CM | POA: Diagnosis not present

## 2021-02-02 DIAGNOSIS — E063 Autoimmune thyroiditis: Secondary | ICD-10-CM

## 2021-02-02 LAB — TSH: TSH: 2.01 u[IU]/mL (ref 0.35–5.50)

## 2021-02-02 MED ORDER — METFORMIN HCL 500 MG PO TABS: 500.0000 mg | ORAL_TABLET | Freq: Two times a day (BID) | ORAL | 1 refills | Status: DC

## 2021-02-02 MED ORDER — VENLAFAXINE HCL ER 150 MG PO CP24: 150.0000 mg | ORAL_CAPSULE | Freq: Every day | ORAL | 1 refills | Status: DC

## 2021-02-02 MED ORDER — LEVOTHYROXINE SODIUM 75 MCG PO TABS: ORAL_TABLET | ORAL | 3 refills | Status: DC

## 2021-02-02 MED ORDER — NALTREXONE HCL 50 MG PO TABS: 25.0000 mg | ORAL_TABLET | Freq: Two times a day (BID) | ORAL | 1 refills | Status: DC

## 2021-02-02 NOTE — Patient Instructions (Signed)
Increase water to 100 ounces a day. Start piliates and walking.  Meal prep- make a grocery list of lean meats and with fruit/veggies> choose off of low glycemic options.    You got this. Little steps at a time.

## 2021-02-02 NOTE — Progress Notes (Signed)
This visit occurred during the SARS-CoV-2 public health emergency.  Safety protocols were in place, including screening questions prior to the visit, additional usage of staff PPE, and extensive cleaning of exam room while observing appropriate contact time as indicated for disinfecting solutions.    Patient ID: [name], female  DOB: 05/05/1976, 45 y.o.   MRN: [id number] Patient Care Team    Relationship Specialty Notifications Start End  Natalia LeatherwoodKuneff, Avelino Herren A, DO PCP - General Family Medicine  10/03/17   Peterson AoMekala, Kavya C, MD Referring Physician Internal Medicine  10/04/17   Tobias AlexanderPalmer, Staci, OD Referring Physician Optometry  10/04/17   Consuello ClossGerancher, Karen R., MD Referring Physician Obstetrics and Gynecology  10/04/17     Chief Complaint  Patient presents with   Hypothyroidism    CMC; pt is not fasting    Subjective: [name] is a 45 y.o.  Female  present for weight loss counseling. Body mass index is 45.66 kg/m. Weight today: 214>211 Patient reports she has not been very motivated.  She has not made dietary changes.  She is not exercising.  She is tolerating metformin and diabetic.  She does feel she is snacking less in the middle of the night. Prior note: Patient reports she has struggled with her weight for many years.  She does not exercise routinely.  Her job requires her to walk and be on her feet frequently.  She does not follow any particular diet.  She does not meal plan.  She does not track her calories.  She has recently started taking Metamucil once daily.  She does admit snacking is an issue for her.  She routinely wakes up in the middle the night and has a snack. She would like to discuss weight loss and possible medications to help her with her weight loss.  Depression/ADD: Patient reports she is overall feeling well on Effexor 150 mg daily. Her ADD is well controlled on adderall 15 mg BID. She reports she has been on multiple different medications with either  side effects or  failure with Cymbalta, Lexapro, Trintellex, Brintellix, vybrid (nightmares), Zoloft(wt) and Paxil.   Indication for controlled substance: ADD Medication and dose: Adderall 15 mg twice daily # pills per month: 60/mo> refill 90 day at a time.  Last UDS date: collected today UDS UTD contract signed (Y/N): Y- resigned today 06/02/2020 Date narcotic database last reviewed (include red flags): 02/04/21  Hypothyroidism: Patient reports compliance with levothyroxine.  Patient due for labs today due to dose adjustment.  [facility] 02/02/2021 10/20/2020 06/02/2020 11/27/2019 05/21/2019  Decreased Interest 0 2 0 0 0  Down, Depressed, Hopeless 0 0 0 1 0  PHQ - 2 Score 0 2 0 1 0  Altered sleeping 2 2 3 3  0  Tired, decreased energy 1 3 1 1  0  Change in appetite 1 3 3  0 0  Feeling bad or failure about yourself  0 0 0 0 0  Trouble concentrating 0 0 0 3 0  Moving slowly or fidgety/restless 0 0 1 0 0  Suicidal thoughts 0 0 - - 0  PHQ-9 Score 4 10 8 8  0  Difficult doing work/chores - - - Not difficult at all Not difficult at all  Some recent data might be hidden   GAD 7 : Generalized Anxiety Score 02/02/2021 06/02/2020 09/25/2018 01/31/2018  Nervous, Anxious, on Edge 1 1 0 2  Control/stop worrying 2 0 0 2  Worry too much - different things 2 3  0 3  Trouble relaxing 1 3 1 3   Restless 0 2 0 1  Easily annoyed or irritable 1 1 0 3  Afraid - awful might happen 0 0 0 1  Total GAD 7 Score 7 10 1 15   Anxiety Difficulty - - Not difficult at all Somewhat difficult   Immunization History  Administered Date(s) Administered   Influenza Inj Mdck Quad With Preservative 03/14/2014   Influenza, Seasonal, Injecte, Preservative Fre 03/13/2010, 04/05/2011   Influenza,inj,Quad PF,6+ Mos 06/02/2020   Influenza-Unspecified 04/05/2011, 03/18/2017   PFIZER(Purple Top)SARS-COV-2 Vaccination 07/24/2019, 08/21/2019, 08/29/2020   Tdap 04/05/2011, 08/19/2020   Past Medical History:  Diagnosis  Date   ADD (attention deficit disorder)    Angular cheilitis 03/13/2018   Arthritis    Chicken pox    Chronic neck pain    HNP   Depression    Headache    Heart murmur    as a baby   Insomnia    Nocturia    Sleep apnea    CPAP-SLEEP STUDY DONE ABOUT 2 YRS AGO   Thyroid disease    UTI (urinary tract infection)    Allergies  Allergen Reactions   Ativan [Lorazepam] Other (See Comments)    hyperactivity   Penicillins Rash   Past Surgical History:  Procedure Laterality Date   ANTERIOR CERVICAL DECOMP/DISCECTOMY FUSION N/A 06/02/2014   Procedure: C6-7 Anterior Cervical Discectomy and Fusion, Allograft, Plate;  Surgeon: 05/13/2018, MD;  Location: [facility] OR;  Service: Orthopedics;  Laterality: N/A;   CESAREAN SECTION  2004/2007   DILATION AND CURETTAGE OF UTERUS  2018   wisdom teeth extracted      Family History  Problem Relation Age of Onset   Arthritis Mother    Depression Mother    Drug abuse Mother    Hyperlipidemia Mother    Hypertension Mother    Alcohol abuse Father    Cancer Sister    Cervical cancer Sister    Early death Son    Cancer Maternal Grandmother    Breast cancer Maternal Grandmother    Thyroid nodules Maternal Grandmother    Heart disease Paternal Grandmother    Alcohol abuse Paternal Grandfather    Social History   Social History Narrative   Married.  2 children.   Some college.  Pharmacy tech at 10/2005.   Drinks caffeinated beverages.  Uses herbal remedies.   Smoke alarms in the home.  Wears her seatbelt.   Feels safe in her relationships.    Allergies as of 02/02/2021       Reactions   Ativan [lorazepam] Other (See Comments)   hyperactivity   Penicillins Rash        Medication List        Accurate as of February 02, 2021 11:59 PM. If you have any questions, ask your nurse or doctor.          amphetamine-dextroamphetamine 15 MG tablet Commonly known as: ADDERALL Take 1 tablet by mouth 2 (two) times daily. What changed: Another  medication with the same name was removed. Continue taking this medication, and follow the directions you see here. Changed by: 02/04/2021, DO   levocetirizine 5 MG tablet Commonly known as: Xyzal Take 1 tablet (5 mg total) by mouth every evening.   levonorgestrel 20 MCG/24HR IUD Commonly known as: MIRENA 1 each by Intrauterine route once.   levothyroxine 75 MCG tablet Commonly known as: SYNTHROID One tab PO QD on an empty stomach 6 days a week,  and 0.5 tab one day a week. What changed:  medication strength additional instructions Changed by: Felix Pacini, DO   metFORMIN 500 MG tablet Commonly known as: GLUCOPHAGE Take 1 tablet (500 mg total) by mouth 2 (two) times daily with a meal.   naltrexone 50 MG tablet Commonly known as: DEPADE Take 0.5 tablets (25 mg total) by mouth in the morning and at bedtime.   venlafaxine XR 150 MG 24 hr capsule Commonly known as: Effexor XR Take 1 capsule (150 mg total) by mouth daily with breakfast.        All past medical history, surgical history, allergies, family history, immunizations andmedications were updated in the EMR today and reviewed under the history and medication portions of their EMR.     No results found.  ROS: 14 pt review of systems performed and negative (unless mentioned in an HPI)  Objective: BP 109/77   Pulse 80   Temp 98.3 F (36.8 C) (Oral)   Ht 4\' 9"  (1.448 m)   Wt 211 lb (95.7 kg)   SpO2 99%   BMI 45.66 kg/m  Gen: Afebrile. No acute distress.  HENT: AT. .  No cough.  No hoarseness. Eyes:Pupils Equal Round Reactive to light, Extraocular movements intact,  Conjunctiva without redness, discharge or icterus. Neck/lymp/endocrine: Supple, no lymphadenopathy, no thyromegaly CV: RRR no murmur, no edema Chest: CTAB, no wheeze or crackles Neuro:  Normal gait. PERLA. EOMi. Alert. Oriented x3 Psych: Normal affect, dress and demeanor. Normal speech. Normal thought content and judgment.   No results  found.  Assessment/plan: [name] is a 45 y.o. female present for CPE/CMC Depression, major, single episode, mild (HCC) Stable Continue Effexor 150 mg daily daily F/U 5.5 mos, unless needed sooner.    ADHD:  Stable Continue Adderall 15 mg twice daily (90d x2)- may take 1/2-1 tab by noon,  69-month prescription provided. NCCS database reviewed 02/04/21 Controlled substance contract updated 06/02/2020  F/u q 5.5 mos    Hypothyroidism: Recheck TSH today and levothyroxine will be refilled and appropriate dose.  Weight loss counseling/morbid obesity (HCC) Start weight: 214 Today's weight: 211 First goal weight: 195 Patient was counseled on exercise, calorie counting, weight loss given today. -Patient was provided with online resources for: Weekly net calorie calculator.  Applications for calorie counting.  Patient was advised to ensure she is taking in adequate nutrition daily by meeting calorie goals.  She has not yet started counting calories.  She is unmotivated. -Patient was educated on dietary changes to not only lose weight but to eat healthy.  Patient was again educated on low glycemic index.  She was encouraged to look over the low glycemic index fruits and vegetables and choose the ones that she enjoys eating.  She should then start incorporating those into her grocery list. -Patient was educated on exercise goal of 150 minutes a week (plus warm up and cool down) of cardiovascular exercise.  Patient was educated on heart rate for cardiovascular and fat burning zones.  She has not yet started this. -Patient was encouraged to maintain adequate water consumption of at least 100 ounces a day, more if exercising/sweating.  She has only been able to get up to around 60 ounces daily. -Patient was encouraged to increase her fiber/Metamucil to twice daily> she has been taking a supplement. -Patient was encouraged to avoid snacking in the middle the night when taking her dog out>  she has been able to cut back on the evening snacking. -Continue metformin 500  mg twice daily  -Continue Depade tapering to 25 mg twice daily (1 of those doses before bed). If desiring continued support during weight loss journey would encourage follow-up every 6 months.  Otherwise, routine follow-ups for other chronic conditions every 5.5 months.   No follow-ups on file.  Orders Placed This Encounter  Procedures   TSH     Meds ordered this encounter  Medications   metFORMIN (GLUCOPHAGE) 500 MG tablet    Sig: Take 1 tablet (500 mg total) by mouth 2 (two) times daily with a meal.    Dispense:  180 tablet    Refill:  1   naltrexone (DEPADE) 50 MG tablet    Sig: Take 0.5 tablets (25 mg total) by mouth in the morning and at bedtime.    Dispense:  90 tablet    Refill:  1   venlafaxine XR (EFFEXOR XR) 150 MG 24 hr capsule    Sig: Take 1 capsule (150 mg total) by mouth daily with breakfast.    Dispense:  90 capsule    Refill:  1    Referral Orders  No referral(s) requested today     Electronically signed by: Felix Pacini, DO [facility] [place]

## 2021-02-02 NOTE — Telephone Encounter (Signed)
Please inform patient the following information: Tsh is improved and at 2.01. We still have room to increase levothyroxine dose > new dose prescribed at [facility]vo 75 mcg qd x6 days and 0.5 tab (1/2) one day a week.

## 2021-02-03 NOTE — Telephone Encounter (Signed)
LVM for pt to CB regarding results.  

## 2021-02-04 ENCOUNTER — Encounter: Payer: Self-pay | Admitting: Family Medicine

## 2021-02-04 NOTE — Telephone Encounter (Signed)
Spoke with pt regarding labs and instructions.   

## 2021-02-09 ENCOUNTER — Ambulatory Visit: Payer: 59

## 2021-02-24 ENCOUNTER — Other Ambulatory Visit: Payer: Self-pay | Admitting: Family Medicine

## 2021-02-24 DIAGNOSIS — F909 Attention-deficit hyperactivity disorder, unspecified type: Secondary | ICD-10-CM

## 2021-02-25 NOTE — Telephone Encounter (Signed)
Please deny. 

## 2021-03-01 ENCOUNTER — Encounter: Payer: Self-pay | Admitting: Family Medicine

## 2021-03-01 DIAGNOSIS — F909 Attention-deficit hyperactivity disorder, unspecified type: Secondary | ICD-10-CM

## 2021-03-01 NOTE — Telephone Encounter (Signed)
Pt was last seen 02/02/21 CMC, advised to f/u 5.5 months for next [facility] She does not have any more refills on fie, 08/19/20(180,0) electronic RF for April, May and June.    Please review and advise

## 2021-03-02 MED ORDER — AMPHETAMINE-DEXTROAMPHETAMINE 15 MG PO TABS: 15.0000 mg | ORAL_TABLET | Freq: Two times a day (BID) | ORAL | 0 refills | Status: DC

## 2021-08-24 ENCOUNTER — Ambulatory Visit: Payer: 59 | Admitting: Family Medicine

## 2021-08-24 ENCOUNTER — Other Ambulatory Visit: Payer: Self-pay

## 2021-08-24 ENCOUNTER — Encounter: Payer: Self-pay | Admitting: Family Medicine

## 2021-08-24 VITALS — BP 124/85 | HR 96 | Temp 97.8°F | Ht <= 58 in | Wt 216.0 lb

## 2021-08-24 DIAGNOSIS — R7309 Other abnormal glucose: Secondary | ICD-10-CM

## 2021-08-24 DIAGNOSIS — F909 Attention-deficit hyperactivity disorder, unspecified type: Secondary | ICD-10-CM

## 2021-08-24 DIAGNOSIS — F320 Major depressive disorder, single episode, mild: Secondary | ICD-10-CM

## 2021-08-24 DIAGNOSIS — R7303 Prediabetes: Secondary | ICD-10-CM

## 2021-08-24 DIAGNOSIS — E038 Other specified hypothyroidism: Secondary | ICD-10-CM

## 2021-08-24 DIAGNOSIS — E063 Autoimmune thyroiditis: Secondary | ICD-10-CM

## 2021-08-24 DIAGNOSIS — Z6841 Body Mass Index (BMI) 40.0 and over, adult: Secondary | ICD-10-CM

## 2021-08-24 MED ORDER — NALTREXONE HCL 50 MG PO TABS: 25.0000 mg | ORAL_TABLET | Freq: Two times a day (BID) | ORAL | 1 refills | Status: DC

## 2021-08-24 MED ORDER — OZEMPIC (0.25 OR 0.5 MG/DOSE) 2 MG/1.5ML ~~LOC~~ SOPN: 0.5000 mg | PEN_INJECTOR | SUBCUTANEOUS | 11 refills | Status: DC

## 2021-08-24 MED ORDER — AMPHETAMINE-DEXTROAMPHETAMINE 15 MG PO TABS: 15.0000 mg | ORAL_TABLET | Freq: Every day | ORAL | 0 refills | Status: DC

## 2021-08-24 MED ORDER — LEVOCETIRIZINE DIHYDROCHLORIDE 5 MG PO TABS: 5.0000 mg | ORAL_TABLET | Freq: Every evening | ORAL | 3 refills | Status: DC

## 2021-08-24 MED ORDER — AMPHETAMINE-DEXTROAMPHETAMINE 15 MG PO TABS: 15.0000 mg | ORAL_TABLET | Freq: Two times a day (BID) | ORAL | 0 refills | Status: DC

## 2021-08-24 MED ORDER — METFORMIN HCL 500 MG PO TABS: 500.0000 mg | ORAL_TABLET | Freq: Two times a day (BID) | ORAL | 1 refills | Status: DC

## 2021-08-24 MED ORDER — LEVOTHYROXINE SODIUM 75 MCG PO TABS: ORAL_TABLET | ORAL | 1 refills | Status: DC

## 2021-08-24 MED ORDER — VENLAFAXINE HCL ER 150 MG PO CP24: 150.0000 mg | ORAL_CAPSULE | Freq: Every day | ORAL | 1 refills | Status: DC

## 2021-08-24 NOTE — Telephone Encounter (Signed)
Diagnostic codes were included on script of Ozempic.  If needing called in, please call the sent to her pharmacy. ?Indications of Use: R73.03, R73.09,E66.01 ? ? ?Lastly please make her aware corrected the second Adderall prescription. ? ?Thanks ?

## 2021-08-24 NOTE — Telephone Encounter (Signed)
Please see message below

## 2021-08-24 NOTE — Progress Notes (Signed)
? ?This visit occurred during the SARS-CoV-2 public health emergency.  Safety protocols were in place, including screening questions prior to the visit, additional usage of staff PPE, and extensive cleaning of exam room while observing appropriate contact time as indicated for disinfecting solutions.  ? ? ?Patient ID: [name], female  DOB: 07-18-1975, 46 y.o.   MRN: [id number] ?Patient Care Team  ?  Relationship Specialty Notifications Start End  ?Natalia Leatherwood, DO PCP - General Family Medicine  10/03/17   ?Peterson Ao, MD Referring Physician Internal Medicine  10/04/17   ?Tobias Alexander, OD Referring Physician Optometry  10/04/17   ?Consuello Closs., MD Referring Physician Obstetrics and Gynecology  10/04/17   ? ? ?Chief Complaint  ?Patient presents with  ? Depression  ?  Cmc; pt is not fasting  ? ? ?Subjective: ?[name] is a 46 y.o.  Female  present for weight loss counseling. ?Body mass index is 46.74 kg/m?. ?Weight today: 214>211>216 ?Patient reports she still having trouble with motivation. She has not made dietary changes.  She is not exercising.  She is tolerating metformin and depade.   She does feel she is snacking less in the middle of the night. A1c has been elevated. ?Prior note: ?Patient reports she has struggled with her weight for many years.  She does not exercise routinely.  Her job requires her to walk and be on her feet frequently.  She does not follow any particular diet.  She does not meal plan.  She does not track her calories.  She has recently started taking Metamucil once daily.  She does admit snacking is an issue for her.  She routinely wakes up in the middle the night and has a snack. ?She would like to discuss weight loss and possible medications to help her with her weight loss. ? ?Depression/ADD: Patient reports compliance w/ Effexor 150 mg daily. Her ADD is well controlled on adderall 15 mg BID. She reports she has been on multiple different medications  with either side effects or  failure with Cymbalta, Lexapro, Trintellex, Brintellix, vybrid (nightmares), Zoloft(wt) and Paxil.   ?Indication for controlled substance: ADD ?Medication and dose: Adderall 15 mg twice daily ?# pills per month: 60/mo> refill 90 day at a time.  ?Last UDS date: collected today UDS UTD ?contract signed (Y/N): Y- resigned today 06/02/2020 ?Date narcotic database last reviewed (include red flags): 08/24/21 ? ?Hypothyroidism: Patient reports compliance with levothyroxine.  Patient due for labs today due to dose adjustment. ? ?Depression screen [facility] 2/9 08/24/2021 02/02/2021 10/20/2020 06/02/2020 11/27/2019  ?Decreased Interest 0 0 2 0 0  ?Down, Depressed, Hopeless 0 0 0 0 1  ?PHQ - 2 Score 0 0 2 0 1  ?Altered sleeping 0 2 2 3 3   ?Tired, decreased energy 0 1 3 1 1   ?Change in appetite 2 1 3 3  0  ?Feeling bad or failure about yourself  0 0 0 0 0  ?Trouble concentrating 2 0 0 0 3  ?Moving slowly or fidgety/restless 0 0 0 1 0  ?Suicidal thoughts 0 0 0 - -  ?PHQ-9 Score 4 4 10 8 8   ?Difficult doing work/chores - - - - Not difficult at all  ?Some recent data might be hidden  ? ?GAD 7 : Generalized Anxiety Score 08/24/2021 02/02/2021 06/02/2020 09/25/2018  ?Nervous, Anxious, on Edge 1 1 1  0  ?Control/stop worrying 0 2 0 0  ?Worry too much - different things 0 2 3 0  ?  Trouble relaxing 0 1 3 1   ?Restless 0 0 2 0  ?Easily annoyed or irritable 0 1 1 0  ?Afraid - awful might happen 0 0 0 0  ?Total GAD 7 Score 1 7 10 1   ?Anxiety Difficulty - - - Not difficult at all  ? ?Immunization History  ?Administered Date(s) Administered  ? Influenza Inj Mdck Quad With Preservative 03/14/2014  ? Influenza, Seasonal, Injecte, Preservative Fre 03/13/2010, 04/05/2011  ? Influenza,inj,Quad PF,6+ Mos 06/02/2020  ? Influenza-Unspecified 04/05/2011, 03/18/2017  ? PFIZER(Purple Top)SARS-COV-2 Vaccination 07/24/2019, 08/21/2019, 08/29/2020  ? Tdap 04/05/2011, 08/19/2020  ? ?Past Medical History:  ?Diagnosis Date  ? ADD (attention  deficit disorder)   ? Angular cheilitis 03/13/2018  ? Arthritis   ? Chicken pox   ? Chronic neck pain   ? HNP  ? Depression   ? Headache   ? Heart murmur   ? as a baby  ? Insomnia   ? Nocturia   ? Sleep apnea   ? CPAP-SLEEP STUDY DONE ABOUT 2 YRS AGO  ? Thyroid disease   ? UTI (urinary tract infection)   ? ?Allergies  ?Allergen Reactions  ? Ativan [Lorazepam] Other (See Comments)  ?  hyperactivity  ? Penicillins Rash  ? ?Past Surgical History:  ?Procedure Laterality Date  ? ANTERIOR CERVICAL DECOMP/DISCECTOMY FUSION N/A 06/02/2014  ? Procedure: C6-7 Anterior Cervical Discectomy and Fusion, Allograft, Plate;  Surgeon: Eldred MangesMark C Yates, MD;  Location: [facility] OR;  Service: Orthopedics;  Laterality: N/A;  ? CESAREAN SECTION  2004/2007  ? DILATION AND CURETTAGE OF UTERUS  2018  ? wisdom teeth extracted     ? ?Family History  ?Problem Relation Age of Onset  ? Arthritis Mother   ? Depression Mother   ? Drug abuse Mother   ? Hyperlipidemia Mother   ? Hypertension Mother   ? Alcohol abuse Father   ? Cancer Sister   ? Cervical cancer Sister   ? Early death Son   ? Cancer Maternal Grandmother   ? Breast cancer Maternal Grandmother   ? Thyroid nodules Maternal Grandmother   ? Heart disease Paternal Grandmother   ? Alcohol abuse Paternal Grandfather   ? ?Social History  ? ?Social History Narrative  ? Married.  2 children.  ? Some college.  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  ? Drinks caffeinated beverages.  Uses herbal remedies.  ? Smoke alarms in the home.  Wears her seatbelt.  ? Feels safe in her relationships.  ? ? ?Allergies as of 08/24/2021   ? ?   Reactions  ? Ativan [lorazepam] Other (See Comments)  ? hyperactivity  ? Penicillins Rash  ? ?  ? ?  ?Medication List  ?  ? ?  ? Accurate as of August 24, 2021 11:07 AM. If you have any questions, ask your nurse or doctor.  ?  ?  ? ?  ? ?amphetamine-dextroamphetamine 15 MG tablet ?Commonly known as: ADDERALL ?Take 1 tablet by mouth 2 (two) times daily. ?What changed: Another medication with the same  name was added. Make sure you understand how and when to take each. ?Changed by: Felix Pacinienee Cannon Arreola, DO ?  ?amphetamine-dextroamphetamine 15 MG tablet ?Commonly known as: Adderall ?Take 1 tablet by mouth daily. ?What changed: You were already taking a medication with the same name, and this prescription was added. Make sure you understand how and when to take each. ?Changed by: Felix Pacinienee Verline Kong, DO ?  ?levocetirizine 5 MG tablet ?Commonly known as: Xyzal ?Take 1 tablet (5 mg  total) by mouth every evening. ?  ?levonorgestrel 20 MCG/24HR IUD ?Commonly known as: MIRENA ?1 each by Intrauterine route once. ?  ?levothyroxine 75 MCG tablet ?Commonly known as: SYNTHROID ?One tab PO QD on an empty stomach 6 days a week, and 0.5 tab one day a week. ?  ?metFORMIN 500 MG tablet ?Commonly known as: GLUCOPHAGE ?Take 1 tablet (500 mg total) by mouth 2 (two) times daily with a meal. ?  ?naltrexone 50 MG tablet ?Commonly known as: DEPADE ?Take 0.5 tablets (25 mg total) by mouth in the morning and at bedtime. ?  ?Ozempic (0.25 or 0.5 MG/DOSE) 2 MG/1.5ML Sopn ?Generic drug: Semaglutide(0.25 or 0.5MG /DOS) ?Inject 0.5 mg into the skin once a week. ?Started by: Felix Pacini, DO ?  ?venlafaxine XR 150 MG 24 hr capsule ?Commonly known as: Effexor XR ?Take 1 capsule (150 mg total) by mouth daily with breakfast. ?  ? ?  ? ? ?All past medical history, surgical history, allergies, family history, immunizations andmedications were updated in the EMR today and reviewed under the history and medication portions of their EMR.    ? ?No results found. ? ?ROS: 14 pt review of systems performed and negative (unless mentioned in an HPI) ? ?Objective: ?BP 124/85   Pulse 96   Temp 97.8 ?F (36.6 ?C) (Oral)   Ht 4\' 9"  (1.448 m)   Wt 216 lb (98 kg)   SpO2 100%   BMI 46.74 kg/m?  ?Physical Exam ?Vitals and nursing note reviewed.  ?Constitutional:   ?   General: She is not in acute distress. ?   Appearance: Normal appearance. She is obese. She is not  ill-appearing, toxic-appearing or diaphoretic.  ?HENT:  ?   Head: Normocephalic and atraumatic.  ?Eyes:  ?   General: No scleral icterus.    ?   Right eye: No discharge.     ?   Left eye: No discharge.  ?   Extraocular Mo

## 2021-08-25 NOTE — Telephone Encounter (Signed)
Lori calling from [employer] regarding diagnosis codes for medication Ozempic ? ?Please call Cecille Rubin at [phone] ?

## 2021-08-26 NOTE — Telephone Encounter (Signed)
Spoke with Lawson Fiscal from [employer] and gave dx codes. ?

## 2021-08-26 NOTE — Telephone Encounter (Signed)
Pharmacy doesn't opens until 10 am.  ?

## 2021-08-31 NOTE — Telephone Encounter (Signed)
Imcivree is indicated for obesity in patients with certain genetic mutations/disorders that cause the obesity. She is not a candidate for that medication. ?

## 2021-08-31 NOTE — Telephone Encounter (Signed)
Called pharmacy and stated that Ozempic is not covered for weight loss.   ?

## 2021-08-31 NOTE — Telephone Encounter (Signed)
Please advise on Imcivree   ?

## 2021-09-03 NOTE — Telephone Encounter (Signed)
Patient returning Toria's call. ?She is at work until 4"30pm today if you can call her back [phone], if not she will talk to you on Monday ?

## 2021-09-07 NOTE — Telephone Encounter (Signed)
Spoke with pt who states she has a new form and will fax [place] a copy.  ?

## 2021-09-07 NOTE — Telephone Encounter (Signed)
Patient is requesting to speak to [facility] by phone not by mychart.  Per patient request please call Katie at work [phone]. ?Patient is aware Dr. Raoul Pitch is out of the office today. ?

## 2021-09-10 NOTE — Telephone Encounter (Signed)
Faxed received and placed on PCP desk for review ?

## 2021-09-13 NOTE — Telephone Encounter (Signed)
Completed form and returned to [place] work basket. ?

## 2021-09-14 NOTE — Telephone Encounter (Signed)
Form faxed

## 2021-09-28 ENCOUNTER — Encounter: Payer: Self-pay | Admitting: Family Medicine

## 2021-10-08 NOTE — Telephone Encounter (Signed)
LVM for pt to CB regarding medication.  ?

## 2021-10-08 NOTE — Telephone Encounter (Signed)
Called pharmacy to ask for a better understanding in regards on form received from [place] with pharm tech who said from was not from them and that they aren't aware of any form. After being placed on a brief hold pt had answer the phone who stated that this was an appeal form that her insurance had told her that was needed. Pt was asked to send the phone number that she received this information. Pt stated she will send via mychart.  ?

## 2021-10-08 NOTE — Telephone Encounter (Signed)
Called number provided by patient and spoke with ins pharm rep who stated that Ozempic is only covered for type to DM and the from that would need to be completed is the Exemption form that was denied on 09/21/21.  ?

## 2021-10-11 NOTE — Telephone Encounter (Signed)
Patient returning call, CMA not available.  Patient asked to please call her at work. ?[phone] ?

## 2021-10-11 NOTE — Telephone Encounter (Signed)
LVM for pt to CB regarding medication.  ?

## 2021-10-11 NOTE — Telephone Encounter (Signed)
Spoke with pt regarding medication and information received from [employer]. Pt was also informed that any further paperwork received must come from insurance company and not her job. Pt verbalized understanding and will sched routine weight counseling appt with PCP.    ?

## 2022-01-25 ENCOUNTER — Encounter: Payer: 59 | Admitting: Family Medicine

## 2022-01-26 ENCOUNTER — Encounter: Payer: Self-pay | Admitting: Family Medicine

## 2022-01-26 ENCOUNTER — Ambulatory Visit: Payer: 59 | Admitting: Family Medicine

## 2022-01-26 VITALS — BP 126/82 | HR 96 | Temp 98.3°F | Ht <= 58 in | Wt 217.8 lb

## 2022-01-26 DIAGNOSIS — E038 Other specified hypothyroidism: Secondary | ICD-10-CM | POA: Diagnosis not present

## 2022-01-26 DIAGNOSIS — F320 Major depressive disorder, single episode, mild: Secondary | ICD-10-CM

## 2022-01-26 DIAGNOSIS — Z1211 Encounter for screening for malignant neoplasm of colon: Secondary | ICD-10-CM

## 2022-01-26 DIAGNOSIS — R7303 Prediabetes: Secondary | ICD-10-CM

## 2022-01-26 DIAGNOSIS — E063 Autoimmune thyroiditis: Secondary | ICD-10-CM

## 2022-01-26 DIAGNOSIS — Z1231 Encounter for screening mammogram for malignant neoplasm of breast: Secondary | ICD-10-CM | POA: Diagnosis not present

## 2022-01-26 DIAGNOSIS — Z0000 Encounter for general adult medical examination without abnormal findings: Secondary | ICD-10-CM | POA: Diagnosis not present

## 2022-01-26 DIAGNOSIS — Z79899 Other long term (current) drug therapy: Secondary | ICD-10-CM

## 2022-01-26 DIAGNOSIS — F909 Attention-deficit hyperactivity disorder, unspecified type: Secondary | ICD-10-CM

## 2022-01-26 LAB — COMPREHENSIVE METABOLIC PANEL
ALT: 10 U/L (ref 0–35)
AST: 13 U/L (ref 0–37)
Albumin: 4.1 g/dL (ref 3.5–5.2)
Alkaline Phosphatase: 79 U/L (ref 39–117)
BUN: 11 mg/dL (ref 6–23)
CO2: 28 mEq/L (ref 19–32)
Calcium: 8.8 mg/dL (ref 8.4–10.5)
Chloride: 103 mEq/L (ref 96–112)
Creatinine, Ser: 0.67 mg/dL (ref 0.40–1.20)
GFR: 106.16 mL/min (ref >60.00)
Glucose, Bld: 68 mg/dL — ABNORMAL LOW (ref 70–99)
Potassium: 4 mEq/L (ref 3.5–5.1)
Sodium: 135 mEq/L (ref 135–145)
Total Bilirubin: 1.2 mg/dL (ref 0.2–1.2)
Total Protein: 6.8 g/dL (ref 6.0–8.3)

## 2022-01-26 LAB — CBC
HCT: 45.5 % (ref 36.0–46.0)
Hemoglobin: 15 g/dL (ref 12.0–15.0)
MCHC: 33 g/dL (ref 30.0–36.0)
MCV: 89.6 fl (ref 78.0–100.0)
Platelets: 330 10*3/uL (ref 150.0–400.0)
RBC: 5.07 Mil/uL (ref 3.87–5.11)
RDW: 13.8 % (ref 11.5–15.5)
WBC: 8.7 10*3/uL (ref 4.0–10.5)

## 2022-01-26 MED ORDER — DOXYCYCLINE HYCLATE 100 MG PO TABS: 100.0000 mg | ORAL_TABLET | Freq: Two times a day (BID) | ORAL | 0 refills | Status: DC

## 2022-01-26 MED ORDER — MUPIROCIN 2 % EX OINT: 1.0000 | TOPICAL_OINTMENT | Freq: Three times a day (TID) | CUTANEOUS | 0 refills | Status: DC

## 2022-01-26 MED ORDER — VENLAFAXINE HCL ER 150 MG PO CP24: 150.0000 mg | ORAL_CAPSULE | Freq: Every day | ORAL | 1 refills | Status: DC

## 2022-01-26 NOTE — Patient Instructions (Signed)
No follow-ups on file.        Great to see you today.  I have refilled the medication(s) we provide.   If labs were collected, we will inform you of lab results once received either by echart message or telephone call.   - echart message- for normal results that have been seen by the patient already.   - telephone call: abnormal results or if patient has not viewed results in their echart.  Health Maintenance, Female Adopting a healthy lifestyle and getting preventive care are important in promoting health and wellness. Ask your health care provider about: The right schedule for you to have regular tests and exams. Things you can do on your own to prevent diseases and keep yourself healthy. What should I know about diet, weight, and exercise? Eat a healthy diet  Eat a diet that includes plenty of vegetables, fruits, low-fat dairy products, and lean protein. Do not eat a lot of foods that are high in solid fats, added sugars, or sodium. Maintain a healthy weight Body mass index (BMI) is used to identify weight problems. It estimates body fat based on height and weight. Your health care provider can help determine your BMI and help you achieve or maintain a healthy weight. Get regular exercise Get regular exercise. This is one of the most important things you can do for your health. Most adults should: Exercise for at least 150 minutes each week. The exercise should increase your heart rate and make you sweat (moderate-intensity exercise). Do strengthening exercises at least twice a week. This is in addition to the moderate-intensity exercise. Spend less time sitting. Even light physical activity can be beneficial. Watch cholesterol and blood lipids Have your blood tested for lipids and cholesterol at 44 years of age, then have this test every 5 years. Have your cholesterol levels checked more often if: Your lipid or cholesterol levels are high. You are older than 44 years of  age. You are at high risk for heart disease. What should I know about cancer screening? Depending on your health history and family history, you may need to have cancer screening at various ages. This may include screening for: Breast cancer. Cervical cancer. Colorectal cancer. Skin cancer. Lung cancer. What should I know about heart disease, diabetes, and high blood pressure? Blood pressure and heart disease High blood pressure causes heart disease and increases the risk of stroke. This is more likely to develop in people who have high blood pressure readings or are overweight. Have your blood pressure checked: Every 3-5 years if you are 18-39 years of age. Every year if you are 40 years old or older. Diabetes Have regular diabetes screenings. This checks your fasting blood sugar level. Have the screening done: Once every three years after age 40 if you are at a normal weight and have a low risk for diabetes. More often and at a younger age if you are overweight or have a high risk for diabetes. What should I know about preventing infection? Hepatitis B If you have a higher risk for hepatitis B, you should be screened for this virus. Talk with your health care provider to find out if you are at risk for hepatitis B infection. Hepatitis C Testing is recommended for: Everyone born from 1945 through 1965. Anyone with known risk factors for hepatitis C. Sexually transmitted infections (STIs) Get screened for STIs, including gonorrhea and chlamydia, if: You are sexually active and are younger than 44 years of age. You are   older than 44 years of age and your health care provider tells you that you are at risk for this type of infection. Your sexual activity has changed since you were last screened, and you are at increased risk for chlamydia or gonorrhea. Ask your health care provider if you are at risk. Ask your health care provider about whether you are at high risk for HIV. Your health  care provider may recommend a prescription medicine to help prevent HIV infection. If you choose to take medicine to prevent HIV, you should first get tested for HIV. You should then be tested every 3 months for as long as you are taking the medicine. Pregnancy If you are about to stop having your period (premenopausal) and you may become pregnant, seek counseling before you get pregnant. Take 400 to 800 micrograms (mcg) of folic acid every day if you become pregnant. Ask for birth control (contraception) if you want to prevent pregnancy. Osteoporosis and menopause Osteoporosis is a disease in which the bones lose minerals and strength with aging. This can result in bone fractures. If you are 65 years old or older, or if you are at risk for osteoporosis and fractures, ask your health care provider if you should: Be screened for bone loss. Take a calcium or vitamin D supplement to lower your risk of fractures. Be given hormone replacement therapy (HRT) to treat symptoms of menopause. Follow these instructions at home: Alcohol use Do not drink alcohol if: Your health care provider tells you not to drink. You are pregnant, may be pregnant, or are planning to become pregnant. If you drink alcohol: Limit how much you have to: 0-1 drink a day. Know how much alcohol is in your drink. In the [place] one drink equals one 12 oz bottle of beer (355 mL), one 5 oz glass of wine (148 mL), or one 1 oz glass of hard liquor (44 mL). Lifestyle Do not use any products that contain nicotine or tobacco. These products include cigarettes, chewing tobacco, and vaping devices, such as e-cigarettes. If you need help quitting, ask your health care provider. Do not use street drugs. Do not share needles. Ask your health care provider for help if you need support or information about quitting drugs. General instructions Schedule regular health, dental, and eye exams. Stay current with your vaccines. Tell your health  care provider if: You often feel depressed. You have ever been abused or do not feel safe at home. Summary Adopting a healthy lifestyle and getting preventive care are important in promoting health and wellness. Follow your health care provider's instructions about healthy diet, exercising, and getting tested or screened for diseases. Follow your health care provider's instructions on monitoring your cholesterol and blood pressure. This information is not intended to replace advice given to you by your health care provider. Make sure you discuss any questions you have with your health care provider. Document Revised: 10/19/2020 Document Reviewed: 10/19/2020 Elsevier Patient Education  2023 Elsevier Inc.  

## 2022-01-26 NOTE — Progress Notes (Unsigned)
Patient ID: [name], female  DOB: [birth year], 46 y.o.   MRN: [id number] Patient Care Team    Relationship Specialty Notifications Start End  Natalia Leatherwood, DO PCP - General Family Medicine  10/03/17   Peterson Ao, MD Referring Physician Internal Medicine  10/04/17   Tobias Alexander, OD Referring Physician Optometry  10/04/17   Consuello Closs., MD Referring Physician Obstetrics and Gynecology  10/04/17     Chief Complaint  Patient presents with   Annual Exam    Pt is fasting. Would like to discuss change of medication from Adderall to something else.    Subjective: Damiana Berrian is a 46 y.o.  Female  present for CPE/CMC All past medical history, surgical history, allergies, family history, immunizations, medications and social history were updated in the electronic medical record today. All recent labs, ED visits and hospitalizations within the last year were reviewed.  Health maintenance:  Colonoscopy: routine screen 47 Mammogram: completed:2019> ordered for mobile bus October at OR  Cervical cancer screening: last pap: 2021,5 yr results:GYN Immunizations: tdap UTD 2022, Influenza UTD  (encouraged yearly),covid x2 Infectious disease screening: HIV completed and Hep C completed  DEXA: routine screen Assistive device: None Oxygen use: None Patient has a Dental home. Hospitalizations/ED visits: Reviewed   Depression/ADD: Patient reports compliance w/ Effexor 150 mg daily. Her ADD is controlled on adderall 15 mg BID, however she does not feel it is as well-controlled as before. She reports she has been on multiple different medications with either side effects or  failure with Cymbalta, Lexapro, Trintellex, Brintellix, vybrid (nightmares), Zoloft(wt) and Paxil.   Indication for controlled substance: ADD Medication and dose: Adderall 15 mg twice daily # pills per month: 60/mo> refill 90 day at a time.  Last UDS date: collected today UDS UTD contract signed  (Y/N): Y- resigned today 06/02/2020 Date narcotic database last reviewed (include red flags): 08/24/21  Hypothyroidism: Patient reports compliance with levothyroxine.       01/26/2022    1:34 PM 08/24/2021    9:58 AM 02/02/2021    9:35 AM 10/20/2020   10:34 AM 06/02/2020    9:11 AM  Depression screen PHQ 2/9  Decreased Interest 0 0 0 2 0  Down, Depressed, Hopeless 0 0 0 0 0  PHQ - 2 Score 0 0 0 2 0  Altered sleeping 2 0 2 2 3   Tired, decreased energy 1 0 1 3 1   Change in appetite 1 2 1 3 3   Feeling bad or failure about yourself  0 0 0 0 0  Trouble concentrating 1 2 0 0 0  Moving slowly or fidgety/restless 0 0 0 0 1  Suicidal thoughts 0 0 0 0   PHQ-9 Score 5 4 4 10 8   Difficult doing work/chores Somewhat difficult          01/26/2022    1:35 PM 08/24/2021    9:59 AM 02/02/2021    9:35 AM 06/02/2020    9:11 AM  GAD 7 : Generalized Anxiety Score  Nervous, Anxious, on Edge 0 1 1 1   Control/stop worrying 0 0 2 0  Worry too much - different things 0 0 2 3  Trouble relaxing 1 0 1 3  Restless 1 0 0 2  Easily annoyed or irritable 0 0 1 1  Afraid - awful might happen 0 0 0 0  Total GAD 7 Score 2 1 7 10   Anxiety Difficulty Not difficult at all  Immunization History  Administered Date(s) Administered   Influenza Inj Mdck Quad With Preservative 03/14/2014   Influenza, Seasonal, Injecte, Preservative Fre 03/13/2010, 04/05/2011   Influenza,inj,Quad PF,6+ Mos 06/02/2020   Influenza-Unspecified 04/05/2011, 03/18/2017   PFIZER(Purple Top)SARS-COV-2 Vaccination 07/24/2019, 08/21/2019, 08/29/2020   Tdap 04/05/2011, 08/19/2020     Past Medical History:  Diagnosis Date   ADD (attention deficit disorder)    Angular cheilitis 03/13/2018   Arthritis    Chicken pox    Chronic neck pain    HNP   Depression    Headache    Heart murmur    as a baby   Insomnia    Nocturia    Sleep apnea    CPAP-SLEEP STUDY DONE ABOUT 2 YRS AGO   Thyroid disease    UTI (urinary tract  infection)    Allergies  Allergen Reactions   Ativan [Lorazepam] Other (See Comments)    hyperactivity   Metformin And Related    Penicillins Rash   Past Surgical History:  Procedure Laterality Date   ANTERIOR CERVICAL DECOMP/DISCECTOMY FUSION N/A 06/02/2014   Procedure: C6-7 Anterior Cervical Discectomy and Fusion, Allograft, Plate;  Surgeon: Eldred Manges, MD;  Location: [facility] OR;  Service: Orthopedics;  Laterality: N/A;   CESAREAN SECTION  2004/2007   DILATION AND CURETTAGE OF UTERUS  2018   wisdom teeth extracted      Family History  Problem Relation Age of Onset   Arthritis Mother    Depression Mother    Drug abuse Mother    Hyperlipidemia Mother    Hypertension Mother    Alcohol abuse Father    Cancer - Cervical Sister    Breast cancer Maternal Grandmother    Thyroid nodules Maternal Grandmother    Heart disease Paternal Grandmother    Alcohol abuse Paternal Grandfather    Early death Son    Social History   Social History Narrative   Married.  2 children.   Some college.  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   Drinks caffeinated beverages.  Uses herbal remedies.   Smoke alarms in the home.  Wears her seatbelt.   Feels safe in her relationships.    Allergies as of 01/26/2022       Reactions   Ativan [lorazepam] Other (See Comments)   hyperactivity   Metformin And Related    Penicillins Rash        Medication List        Accurate as of January 26, 2022 11:59 PM. If you have any questions, ask your nurse or doctor.          STOP taking these medications    amphetamine-dextroamphetamine 15 MG tablet Commonly known as: ADDERALL Replaced by: amphetamine-dextroamphetamine 20 MG tablet Stopped by: Felix Pacini, DO   amphetamine-dextroamphetamine 15 MG tablet Commonly known as: Adderall Replaced by: amphetamine-dextroamphetamine 20 MG tablet Stopped by: Felix Pacini, DO   naltrexone 50 MG tablet Commonly known as: DEPADE Stopped by: Felix Pacini, DO   Ozempic  (0.25 or 0.5 MG/DOSE) 2 MG/1.5ML Sopn Generic drug: Semaglutide(0.25 or 0.5MG /DOS) Stopped by: Felix Pacini, DO       TAKE these medications    amphetamine-dextroamphetamine 20 MG tablet Commonly known as: ADDERALL Take 1 tablet (20 mg total) by mouth 2 (two) times daily. Replaces: amphetamine-dextroamphetamine 15 MG tablet Started by: Felix Pacini, DO   amphetamine-dextroamphetamine 20 MG tablet Commonly known as: ADDERALL Take 1 tablet (20 mg total) by mouth 2 (two) times daily. Replaces: amphetamine-dextroamphetamine 15 MG tablet Started by:  Dane Bloch, DO   doxycycline 100 MG tablet Commonly known as: VIBRA-TABS Take 1 tablet (100 mg total) by mouth 2 (two) times daily. Started by: Felix Pacini, DO   levocetirizine 5 MG tablet Commonly known as: Xyzal Take 1 tablet (5 mg total) by mouth every evening.   levonorgestrel 20 MCG/24HR IUD Commonly known as: MIRENA 1 each by Intrauterine route once.   levothyroxine 75 MCG tablet Commonly known as: SYNTHROID One tab PO QD on an empty stomach 6 days a week, and 0.5 tab one day a week.   metFORMIN 500 MG tablet Commonly known as: GLUCOPHAGE Take 1 tablet (500 mg total) by mouth 2 (two) times daily with a meal.   mupirocin ointment 2 % Commonly known as: BACTROBAN Apply 1 Application topically 3 (three) times daily. Started by: Felix Pacini, DO   venlafaxine XR 150 MG 24 hr capsule Commonly known as: Effexor XR Take 1 capsule (150 mg total) by mouth daily with breakfast.        All past medical history, surgical history, allergies, family history, immunizations andmedications were updated in the EMR today and reviewed under the history and medication portions of their EMR.     Recent Results (from the past 2160 hour(s))  CBC     Status: None   Collection Time: 01/26/22  2:03 PM  Result Value Ref Range   WBC 8.7 4.0 - 10.5 K/uL   RBC 5.07 3.87 - 5.11 Mil/uL   Platelets 330.0 150.0 - 400.0 K/uL   Hemoglobin  15.0 12.0 - 15.0 g/dL   HCT 16.1 09.6 - 04.5 %   MCV 89.6 78.0 - 100.0 fl   MCHC 33.0 30.0 - 36.0 g/dL   RDW 40.9 81.1 - 91.4 %  Comprehensive metabolic panel     Status: Abnormal   Collection Time: 01/26/22  2:03 PM  Result Value Ref Range   Sodium 135 135 - 145 mEq/L   Potassium 4.0 3.5 - 5.1 mEq/L   Chloride 103 96 - 112 mEq/L   CO2 28 19 - 32 mEq/L   Glucose, Bld 68 (L) 70 - 99 mg/dL   BUN 11 6 - 23 mg/dL   Creatinine, Ser 7.82 0.40 - 1.20 mg/dL   Total Bilirubin 1.2 0.2 - 1.2 mg/dL   Alkaline Phosphatase 79 39 - 117 U/L   AST 13 0 - 37 U/L   ALT 10 0 - 35 U/L   Total Protein 6.8 6.0 - 8.3 g/dL   Albumin 4.1 3.5 - 5.2 g/dL   GFR 956.21 >30.86 mL/min    Comment: Calculated using the CKD-EPI Creatinine Equation (2021)   Calcium 8.8 8.4 - 10.5 mg/dL  Hemoglobin V7Q     Status: None   Collection Time: 01/26/22  2:03 PM  Result Value Ref Range   Hgb A1c MFr Bld 5.1 <5.7 % of total Hgb    Comment: For the purpose of screening for the presence of diabetes: . <5.7%       Consistent with the absence of diabetes 5.7-6.4%    Consistent with increased risk for diabetes             (prediabetes) > or =6.5%  Consistent with diabetes . This assay result is consistent with a decreased risk of diabetes. . Currently, no consensus exists regarding use of hemoglobin A1c for diagnosis of diabetes in children. . According to American Diabetes Association (ADA) guidelines, hemoglobin A1c <7.0% represents optimal control in non-pregnant diabetic patients. Different metrics may apply  to specific patient populations.  Standards of Medical Care in Diabetes(ADA). .    Mean Plasma Glucose 100 mg/dL   eAG (mmol/L) 5.5 mmol/L  Lipid panel     Status: None   Collection Time: 01/26/22  2:03 PM  Result Value Ref Range   Cholesterol 166 <200 mg/dL   HDL 61 > OR = 50 mg/dL   Triglycerides 52 <191 mg/dL   LDL Cholesterol (Calc) 92 mg/dL (calc)    Comment: Reference range: <100 . Desirable  range <100 mg/dL for primary prevention;   <70 mg/dL for patients with CHD or diabetic patients  with > or = 2 CHD risk factors. Marland Kitchen LDL-C is now calculated using the Martin-Hopkins  calculation, which is a validated novel method providing  better accuracy than the Friedewald equation in the  estimation of LDL-C.  Horald Pollen et al. Lenox Ahr. 4782;956(21): 2061-2068  (http://education.[facility].com/faq/FAQ164)    Total CHOL/HDL Ratio 2.7 <5.0 (calc)   Non-HDL Cholesterol (Calc) 105 <130 mg/dL (calc)    Comment: For patients with diabetes plus 1 major ASCVD risk  factor, treating to a non-HDL-C goal of <100 mg/dL  (LDL-C of <30 mg/dL) is considered a therapeutic  option.   TSH     Status: Abnormal   Collection Time: 01/26/22  2:03 PM  Result Value Ref Range   TSH 8.96 (H) mIU/L    Comment:           Reference Range .           > or = 20 Years  0.40-4.50 .                Pregnancy Ranges           First trimester    0.26-2.66           Second trimester   0.55-2.73           Third trimester    0.43-2.91     No results found.   ROS 14 pt review of systems performed and negative (unless mentioned in an HPI)  Objective: BP 126/82   Pulse 96   Temp 98.3 F (36.8 C)   Ht 4' 9.5" (1.461 m)   Wt 217 lb 12.8 oz (98.8 kg)   SpO2 99%   BMI 46.32 kg/m  Physical Exam Vitals and nursing note reviewed.  Constitutional:      General: She is not in acute distress.    Appearance: Normal appearance. She is obese. She is not ill-appearing or toxic-appearing.  HENT:     Head: Normocephalic and atraumatic.     Right Ear: Tympanic membrane, ear canal and external ear normal. There is no impacted cerumen.     Left Ear: Tympanic membrane, ear canal and external ear normal. There is no impacted cerumen.     Nose: No congestion or rhinorrhea.     Mouth/Throat:     Mouth: Mucous membranes are moist.     Pharynx: Oropharynx is clear. No oropharyngeal exudate or posterior oropharyngeal  erythema.  Eyes:     General: No scleral icterus.       Right eye: No discharge.        Left eye: No discharge.     Extraocular Movements: Extraocular movements intact.     Conjunctiva/sclera: Conjunctivae normal.     Pupils: Pupils are equal, round, and reactive to light.  Cardiovascular:     Rate and Rhythm: Normal rate and regular rhythm.     Pulses: Normal pulses.  Heart sounds: Normal heart sounds. No murmur heard.    No friction rub. No gallop.  Pulmonary:     Effort: Pulmonary effort is normal. No respiratory distress.     Breath sounds: Normal breath sounds. No stridor. No wheezing, rhonchi or rales.  Chest:     Chest wall: No tenderness.  Abdominal:     General: Abdomen is flat. Bowel sounds are normal. There is no distension.     Palpations: Abdomen is soft. There is no mass.     Tenderness: There is no abdominal tenderness. There is no right CVA tenderness, left CVA tenderness, guarding or rebound.     Hernia: No hernia is present.  Musculoskeletal:        General: No swelling, tenderness or deformity. Normal range of motion.     Cervical back: Normal range of motion and neck supple. No rigidity or tenderness.     Right lower leg: No edema.     Left lower leg: No edema.  Lymphadenopathy:     Cervical: No cervical adenopathy.  Skin:    General: Skin is warm and dry.     Coloration: Skin is not jaundiced or pale.     Findings: No bruising, erythema, lesion or rash.  Neurological:     General: No focal deficit present.     Mental Status: She is alert and oriented to person, place, and time. Mental status is at baseline.     Cranial Nerves: No cranial nerve deficit.     Sensory: No sensory deficit.     Motor: No weakness.     Coordination: Coordination normal.     Gait: Gait normal.     Deep Tendon Reflexes: Reflexes normal.  Psychiatric:        Mood and Affect: Mood normal.        Behavior: Behavior normal.        Thought Content: Thought content normal.         Judgment: Judgment normal.       No results found.  Assessment/plan: Jihan Mellette is a 46 y.o. female present for CPE/CMC Depression, major, single episode, mild (HCC) Stable Continue Effexor 150 mg daily daily  ADHD:  Okay coverage but could use an increase if possible  increase Adderall 20 mg twice daily (90d x2)- may take 1/2-1 tab by noon,  28-month prescription provided. [place]CCS database reviewed 01/27/22 Controlled substance contract in place F/u q 5.5 mos   Hypothyroidism: TSH collected today Continue levothyroxine 75 mcg qd for now.  Refills will be provided and appropriate dose once results received  Morbid obesity (HCC) - Lipid panel  Encounter for long-term current use of medication - CBC - Comprehensive metabolic panel Prediabetes - Hemoglobin A1c Breast cancer screening by mammogram - MM 3D SCREEN BREAST BILATERAL; Future Colon cancer screening - Ambulatory referral to Gastroenterology Routine general medical examination at a health care facility Colonoscopy: routine screen 47 Mammogram: completed:2019> ordered for mobile bus October at OR  Cervical cancer screening: last pap: 2021,5 yr results:GYN Immunizations: tdap UTD 2022, Influenza UTD  (encouraged yearly),covid x2 Infectious disease screening: HIV completed and Hep C completed  DEXA: routine screen Patient was encouraged to exercise greater than 150 minutes a week. Patient was encouraged to choose a diet filled with fresh fruits and vegetables, and lean meats. AVS provided to patient today for education/recommendation on gender specific health and safety maintenance.   Return in about 24 weeks (around 07/13/2022) for Routine chronic condition follow-up.  Orders  Placed This Encounter  Procedures   MM 3D SCREEN BREAST BILATERAL   CBC   Comprehensive metabolic panel   Hemoglobin A1c   Lipid panel   TSH   Ambulatory referral to Gastroenterology    Orders Placed This Encounter  Procedures    MM 3D SCREEN BREAST BILATERAL   CBC   Comprehensive metabolic panel   Hemoglobin A1c   Lipid panel   TSH   Ambulatory referral to Gastroenterology   Meds ordered this encounter  Medications   venlafaxine XR (EFFEXOR XR) 150 MG 24 hr capsule    Sig: Take 1 capsule (150 mg total) by mouth daily with breakfast.    Dispense:  90 capsule    Refill:  1   doxycycline (VIBRA-TABS) 100 MG tablet    Sig: Take 1 tablet (100 mg total) by mouth 2 (two) times daily.    Dispense:  14 tablet    Refill:  0   mupirocin ointment (BACTROBAN) 2 %    Sig: Apply 1 Application topically 3 (three) times daily.    Dispense:  15 g    Refill:  0    Ointment or cream- whichever formulary   amphetamine-dextroamphetamine (ADDERALL) 20 MG tablet    Sig: Take 1 tablet (20 mg total) by mouth 2 (two) times daily.    Dispense:  180 tablet    Refill:  0    May fill ~85d after prescribe date   amphetamine-dextroamphetamine (ADDERALL) 20 MG tablet    Sig: Take 1 tablet (20 mg total) by mouth 2 (two) times daily.    Dispense:  180 tablet    Refill:  0   Referral Orders         Ambulatory referral to Gastroenterology       Electronically signed by: Felix Pacinienee Karletta Millay, DO [facility]

## 2022-01-27 ENCOUNTER — Telehealth: Payer: Self-pay | Admitting: Family Medicine

## 2022-01-27 LAB — TSH: TSH: 8.96 mIU/L — ABNORMAL HIGH

## 2022-01-27 LAB — LIPID PANEL
Cholesterol: 166 mg/dL (ref <200)
HDL: 61 mg/dL (ref >=50)
LDL Cholesterol (Calc): 92 mg/dL (calc)
Non-HDL Cholesterol (Calc): 105 mg/dL (calc) (ref <130)
Total CHOL/HDL Ratio: 2.7 (calc) (ref <5.0)
Triglycerides: 52 mg/dL (ref <150)

## 2022-01-27 LAB — HEMOGLOBIN A1C
Hgb A1c MFr Bld: 5.1 % of total Hgb (ref <5.7)
Mean Plasma Glucose: 100 mg/dL
eAG (mmol/L): 5.5 mmol/L

## 2022-01-27 MED ORDER — LEVOTHYROXINE SODIUM 88 MCG PO TABS: ORAL_TABLET | ORAL | 1 refills | Status: DC

## 2022-01-27 MED ORDER — AMPHETAMINE-DEXTROAMPHETAMINE 20 MG PO TABS: 20.0000 mg | ORAL_TABLET | Freq: Two times a day (BID) | ORAL | 0 refills | Status: DC

## 2022-01-27 NOTE — Telephone Encounter (Signed)
LM for pt to return call regarding results.  

## 2022-01-27 NOTE — Telephone Encounter (Signed)
Patient has been informed of lab results.

## 2022-01-27 NOTE — Telephone Encounter (Signed)
Please call patient Liver and kidney functions are normal. Thyroid is under supplemented, I have called in a new prescription for her levothyroxine at 88 mcg daily. Blood cell counts and electrolytes are normal Diabetes screening/A1c is normal  Cholesterol panel looks great and is at goal

## 2022-02-21 ENCOUNTER — Encounter: Payer: Self-pay | Admitting: Family Medicine

## 2022-03-10 DIAGNOSIS — M545 Low back pain, unspecified: Secondary | ICD-10-CM | POA: Insufficient documentation

## 2022-04-01 ENCOUNTER — Ambulatory Visit
Admission: RE | Admit: 2022-04-01 | Discharge: 2022-04-01 | Disposition: A | Discharge status: Home / Self Care | Payer: 59 | Source: Ambulatory Visit | Attending: Family Medicine | Admitting: Family Medicine

## 2022-04-01 DIAGNOSIS — Z1231 Encounter for screening mammogram for malignant neoplasm of breast: Secondary | ICD-10-CM

## 2022-04-13 ENCOUNTER — Encounter: Payer: Self-pay | Admitting: Family Medicine

## 2022-04-13 NOTE — Telephone Encounter (Signed)
approved

## 2022-05-10 DIAGNOSIS — M47819 Spondylosis without myelopathy or radiculopathy, site unspecified: Secondary | ICD-10-CM | POA: Insufficient documentation

## 2022-05-10 DIAGNOSIS — M5416 Radiculopathy, lumbar region: Secondary | ICD-10-CM | POA: Insufficient documentation

## 2022-05-10 DIAGNOSIS — M47816 Spondylosis without myelopathy or radiculopathy, lumbar region: Secondary | ICD-10-CM | POA: Insufficient documentation

## 2022-07-19 ENCOUNTER — Ambulatory Visit: Payer: 59 | Admitting: Family Medicine

## 2022-07-20 ENCOUNTER — Ambulatory Visit: Payer: 59 | Admitting: Family Medicine

## 2022-07-20 ENCOUNTER — Encounter: Payer: Self-pay | Admitting: Family Medicine

## 2022-07-20 VITALS — BP 137/85 | HR 70 | Temp 98.1°F | Wt 217.0 lb

## 2022-07-20 DIAGNOSIS — F909 Attention-deficit hyperactivity disorder, unspecified type: Secondary | ICD-10-CM

## 2022-07-20 DIAGNOSIS — E038 Other specified hypothyroidism: Secondary | ICD-10-CM

## 2022-07-20 DIAGNOSIS — E063 Autoimmune thyroiditis: Secondary | ICD-10-CM

## 2022-07-20 DIAGNOSIS — F320 Major depressive disorder, single episode, mild: Secondary | ICD-10-CM | POA: Diagnosis not present

## 2022-07-20 MED ORDER — AMPHETAMINE-DEXTROAMPHETAMINE 20 MG PO TABS: 20.0000 mg | ORAL_TABLET | Freq: Two times a day (BID) | ORAL | 0 refills | Status: DC

## 2022-07-20 MED ORDER — VENLAFAXINE HCL ER 150 MG PO CP24: 150.0000 mg | ORAL_CAPSULE | Freq: Every day | ORAL | 1 refills | Status: DC

## 2022-07-20 MED ORDER — LEVOTHYROXINE SODIUM 88 MCG PO TABS: 88.0000 ug | ORAL_TABLET | Freq: Every day | ORAL | 1 refills | Status: DC

## 2022-07-20 NOTE — Patient Instructions (Addendum)
Return in about 7 weeks (around 09/07/2022) for lab appt (non-fasting). Thyroid check   Around July 24th for provider appt        Great to see you today.  I have refilled the medication(s) we provide.   If labs were collected, we will inform you of lab results once received either by echart message or telephone call.   - echart message- for normal results that have been seen by the patient already.   - telephone call: abnormal results or if patient has not viewed results in their echart.

## 2022-07-20 NOTE — Progress Notes (Signed)
Patient ID: [name], female  DOB: December 19, 1975, 47 y.o.   MRN: [id number] Patient Care Team    Relationship Specialty Notifications Start End  Natalia Leatherwood, DO PCP - General Family Medicine  10/03/17   Peterson Ao, MD Referring Physician Internal Medicine  10/04/17   Tobias Alexander, OD Referring Physician Optometry  10/04/17   Consuello Closs., MD Referring Physician Obstetrics and Gynecology  10/04/17     Chief Complaint  Patient presents with   ADD    cmc    Subjective: [name] is a 47 y.o.  Female  present for  Chronic Conditions/illness Management  All past medical history, surgical history, allergies, family history, immunizations, medications and social history were updated in the electronic medical record today. All recent labs, ED visits and hospitalizations within the last year were reviewed.  Depression/ADD: Patient reports compliance w/ Effexor 150 mg daily. Her ADD is controlled on adderall 15 mg BID, however she does not feel it is as well-controlled as before. She reports she has been on multiple different medications with either side effects or  failure with Cymbalta, Lexapro, Trintellex, Brintellix, vybrid (nightmares), Zoloft(wt) and Paxil.   Indication for controlled substance: ADD Medication and dose: Adderall 15 mg twice daily # pills per month: 60/mo> refill 90 day at a time.  Last UDS date: collected today UDS UTD contract signed (Y/N): Y- resigned today 06/02/2020 Date narcotic database last reviewed (include red flags): 07/20/22  Hypothyroidism: Patient reports not great compliance with levothyroxine 88 mcg      07/20/2022    9:59 AM 01/26/2022    1:34 PM 08/24/2021    9:58 AM 02/02/2021    9:35 AM 10/20/2020   10:34 AM  Depression screen PHQ 2/9  Decreased Interest 0 0 0 0 2  Down, Depressed, Hopeless 0 0 0 0 0  PHQ - 2 Score 0 0 0 0 2  Altered sleeping 0 2 0 2 2  Tired, decreased energy 0 1 0 1 3  Change in appetite 1 1 2 1 3    Feeling bad or failure about yourself  0 0 0 0 0  Trouble concentrating 1 1 2  0 0  Moving slowly or fidgety/restless 0 0 0 0 0  Suicidal thoughts 0 0 0 0 0  PHQ-9 Score 2 5 4 4 10   Difficult doing work/chores Not difficult at all Somewhat difficult         07/20/2022    9:59 AM 01/26/2022    1:35 PM 08/24/2021    9:59 AM 02/02/2021    9:35 AM  GAD 7 : Generalized Anxiety Score  Nervous, Anxious, on Edge 0 0 1 1  Control/stop worrying 0 0 0 2  Worry too much - different things 0 0 0 2  Trouble relaxing 0 1 0 1  Restless 0 1 0 0  Easily annoyed or irritable 0 0 0 1  Afraid - awful might happen 0 0 0 0  Total GAD 7 Score 0 2 1 7   Anxiety Difficulty  Not difficult at all       Immunization History  Administered Date(s) Administered   Influenza Inj Mdck Quad With Preservative 03/14/2014   Influenza, Seasonal, Injecte, Preservative Fre 03/13/2010, 04/05/2011   Influenza,inj,Quad PF,6+ Mos 06/02/2020   Influenza-Unspecified 04/05/2011, 03/18/2017   PFIZER(Purple Top)SARS-COV-2 Vaccination 07/24/2019, 08/21/2019, 08/29/2020   Tdap 04/05/2011, 08/19/2020     Past Medical History:  Diagnosis Date   ADD (attention deficit disorder)  Angular cheilitis 03/13/2018   Arthritis    Chicken pox    Chronic neck pain    HNP   Depression    Headache    Heart murmur    as a baby   Insomnia    Nocturia    Sleep apnea    CPAP-SLEEP STUDY DONE ABOUT 2 YRS AGO   Thyroid disease    UTI (urinary tract infection)    Allergies  Allergen Reactions   Ativan [Lorazepam] Other (See Comments)    hyperactivity   Metformin And Related    Penicillins Rash   Past Surgical History:  Procedure Laterality Date   ANTERIOR CERVICAL DECOMP/DISCECTOMY FUSION N/A 06/02/2014   Procedure: C6-7 Anterior Cervical Discectomy and Fusion, Allograft, Plate;  Surgeon: Marybelle Killings, MD;  Location: [facility];  Service: Orthopedics;  Laterality: N/A;   CESAREAN SECTION  2004/2007   DILATION AND CURETTAGE OF  UTERUS  2018   wisdom teeth extracted      Family History  Problem Relation Age of Onset   Arthritis Mother    Depression Mother    Drug abuse Mother    Hyperlipidemia Mother    Hypertension Mother    Alcohol abuse Father    Cancer - Cervical Sister    Breast cancer Maternal Grandmother    Thyroid nodules Maternal Grandmother    Heart disease Paternal Grandmother    Alcohol abuse Paternal Grandfather    Early death Son    Social History   Social History Narrative   Married.  2 children.   Some college.  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   Smoke alarms in the home.  Wears her seatbelt.   Feels safe in her relationships.    Allergies as of 07/20/2022       Reactions   Ativan [lorazepam] Other (See Comments)   hyperactivity   Metformin And Related    Penicillins Rash        Medication List        Accurate as of July 20, 2022 10:20 AM. If you have any questions, ask your nurse or doctor.          STOP taking these medications    diazepam 5 MG tablet Commonly known as: VALIUM Stopped by: Howard Pouch, DO   doxycycline 100 MG tablet Commonly known as: VIBRA-TABS Stopped by: Howard Pouch, DO   gabapentin 300 MG capsule Commonly known as: NEURONTIN Stopped by: Howard Pouch, DO   metFORMIN 500 MG tablet Commonly known as: GLUCOPHAGE Stopped by: Howard Pouch, DO   methocarbamol 500 MG tablet Commonly known as: ROBAXIN Stopped by: Howard Pouch, DO   mupirocin ointment 2 % Commonly known as: BACTROBAN Stopped by: Howard Pouch, DO       TAKE these medications    amphetamine-dextroamphetamine 20 MG tablet Commonly known as: ADDERALL Take 1 tablet (20 mg total) by mouth 2 (two) times daily.   amphetamine-dextroamphetamine 20 MG tablet Commonly known as: ADDERALL Take 1 tablet (20 mg total) by mouth 2 (two) times daily.   levocetirizine 5 MG tablet Commonly known as: Xyzal Take 1 tablet (5 mg  total) by mouth every evening.   levonorgestrel 20 MCG/24HR IUD Commonly known as: MIRENA 1 each by Intrauterine route once.   levothyroxine 88 MCG tablet Commonly known as: SYNTHROID Take 1 tablet (88 mcg total) by mouth daily before breakfast. What changed:  how much to take how to take this when to take  this additional instructions Changed by: Howard Pouch, DO   venlafaxine XR 150 MG 24 hr capsule Commonly known as: Effexor XR Take 1 capsule (150 mg total) by mouth daily with breakfast.        All past medical history, surgical history, allergies, family history, immunizations andmedications were updated in the EMR today and reviewed under the history and medication portions of their EMR.     No results found for this or any previous visit (from the past 2160 hour(s)).   No results found.   ROS 14 pt review of systems performed and negative (unless mentioned in an HPI)  Objective: BP 137/85   Pulse 70   Temp 98.1 F (36.7 C)   Wt 217 lb (98.4 kg)   SpO2 99%   BMI 46.15 kg/m  Physical Exam Vitals and nursing note reviewed.  Constitutional:      General: She is not in acute distress.    Appearance: Normal appearance. She is not ill-appearing, toxic-appearing or diaphoretic.  HENT:     Head: Normocephalic and atraumatic.  Eyes:     General: No scleral icterus.       Right eye: No discharge.        Left eye: No discharge.     Extraocular Movements: Extraocular movements intact.     Conjunctiva/sclera: Conjunctivae normal.     Pupils: Pupils are equal, round, and reactive to light.  Cardiovascular:     Rate and Rhythm: Normal rate and regular rhythm.  Pulmonary:     Effort: Pulmonary effort is normal. No respiratory distress.     Breath sounds: Normal breath sounds. No wheezing, rhonchi or rales.  Musculoskeletal:     Right lower leg: No edema.     Left lower leg: No edema.  Skin:    General: Skin is warm.     Findings: No rash.  Neurological:      Mental Status: She is alert and oriented to person, place, and time. Mental status is at baseline.     Motor: No weakness.     Gait: Gait normal.  Psychiatric:        Mood and Affect: Mood normal.        Behavior: Behavior normal.        Thought Content: Thought content normal.        Judgment: Judgment normal.       No results found.  Assessment/plan: [name] is a 46 y.o. female present for Chronic Conditions/illness Management Depression, major, single episode, mild (HCC) Stable Continue  Effexor 150 mg daily daily  ADHD:  Okay coverage but could use an increase if possible  increase Adderall 20 mg twice daily (90d x2)- may take 1/2-1 tab by noon,  59-month prescription provided. [facility] database reviewed 07/20/22 Controlled substance contract in place F/u q 5.5 mos   Hypothyroidism: Continue levothyroxine 88 mcg qd for now.  Future labs 7 weeks- lab appt only once taking med routinely.     Return in about 7 weeks (around 09/07/2022) for lab appt (non-fasting).  Orders Placed This Encounter  Procedures   T3, free   T4, free   TSH    Orders Placed This Encounter  Procedures   T3, free   T4, free   TSH   Meds ordered this encounter  Medications   amphetamine-dextroamphetamine (ADDERALL) 20 MG tablet    Sig: Take 1 tablet (20 mg total) by mouth 2 (two) times daily.    Dispense:  180 tablet  Refill:  0    May fill ~85d after prescribe date   amphetamine-dextroamphetamine (ADDERALL) 20 MG tablet    Sig: Take 1 tablet (20 mg total) by mouth 2 (two) times daily.    Dispense:  180 tablet    Refill:  0   levothyroxine (SYNTHROID) 88 MCG tablet    Sig: Take 1 tablet (88 mcg total) by mouth daily before breakfast.    Dispense:  90 tablet    Refill:  1   venlafaxine XR (EFFEXOR XR) 150 MG 24 hr capsule    Sig: Take 1 capsule (150 mg total) by mouth daily with breakfast.    Dispense:  90 capsule    Refill:  1   Referral Orders  No referral(s)  requested today      Electronically signed by: Howard Pouch, [place]

## 2022-09-07 ENCOUNTER — Other Ambulatory Visit (INDEPENDENT_AMBULATORY_CARE_PROVIDER_SITE_OTHER): Payer: 59

## 2022-09-07 DIAGNOSIS — E038 Other specified hypothyroidism: Secondary | ICD-10-CM

## 2022-09-07 DIAGNOSIS — E063 Autoimmune thyroiditis: Secondary | ICD-10-CM | POA: Diagnosis not present

## 2022-09-07 LAB — TSH: TSH: 3 u[IU]/mL (ref 0.35–5.50)

## 2022-09-07 LAB — T4, FREE: Free T4: 1.01 ng/dL (ref 0.60–1.60)

## 2022-09-08 ENCOUNTER — Telehealth: Payer: Self-pay | Admitting: Family Medicine

## 2022-09-08 MED ORDER — LEVOTHYROXINE SODIUM 88 MCG PO TABS: 88.0000 ug | ORAL_TABLET | Freq: Every day | ORAL | 1 refills | Status: DC

## 2022-09-08 NOTE — Telephone Encounter (Signed)
Please inform patient her thyroid is now within normal range Continue on current dose of levothyroxine.

## 2022-10-11 ENCOUNTER — Other Ambulatory Visit: Payer: Self-pay | Admitting: Family Medicine

## 2022-12-19 ENCOUNTER — Ambulatory Visit: Payer: 59 | Admitting: Family Medicine

## 2022-12-19 VITALS — BP 102/78 | HR 87 | Temp 98.1°F | Wt 218.4 lb

## 2022-12-19 DIAGNOSIS — W540XXA Bitten by dog, initial encounter: Secondary | ICD-10-CM | POA: Diagnosis not present

## 2022-12-19 DIAGNOSIS — S61459A Open bite of unspecified hand, initial encounter: Secondary | ICD-10-CM | POA: Diagnosis not present

## 2022-12-19 DIAGNOSIS — S91051A Open bite, right ankle, initial encounter: Secondary | ICD-10-CM | POA: Diagnosis not present

## 2022-12-19 NOTE — Patient Instructions (Addendum)
Return in about 1 week (around 12/26/2022) for suture removal/wound check.        Great to see you today.  I have refilled the medication(s) we provide.   If labs were collected, we will inform you of lab results once received either by echart message or telephone call.   - echart message- for normal results that have been seen by the patient already.   - telephone call: abnormal results or if patient has not viewed results in their echart.

## 2022-12-19 NOTE — Progress Notes (Signed)
[name] , 1975-11-18, 47 y.o., female MRN: [id number] Patient Care Team    Relationship Specialty Notifications Start End  Natalia Leatherwood, DO PCP - General Family Medicine  10/03/17   Peterson Ao, MD Referring Physician Internal Medicine  10/04/17   Tobias Alexander, OD Referring Physician Optometry  10/04/17   Consuello Closs., MD Referring Physician Obstetrics and Gynecology  10/04/17     Chief Complaint  Patient presents with   Animal Bite    Dog bite 07/05; right leg and hand. Stitches on both leg and hand; currently on flagyl and ceftin     Subjective: [name] is a 47 y.o. Pt presents for an OV with complaints of dog bite of right hand and right ankle that occurred late July 5th. Tdap UTD 08/2020. Her small dog bit her. Rabies vaccine UTD.  Pt was seen at an UC. She reports xray was normal. She was told there was no tendon or nerve injury. She was prescribed abx, which she is taking. She is changing her dressings every 12 hours.   She is have difficulty standing/walking d/t location of right lower leg lac. Working is difficult with standing and use of her right hand is limited.      12/19/2022    2:32 PM 07/20/2022    9:59 AM 01/26/2022    1:34 PM 08/24/2021    9:58 AM 02/02/2021    9:35 AM  Depression screen PHQ 2/9  Decreased Interest 0 0 0 0 0  Down, Depressed, Hopeless 0 0 0 0 0  PHQ - 2 Score 0 0 0 0 0  Altered sleeping 0 0 2 0 2  Tired, decreased energy 0 0 1 0 1  Change in appetite 0 1 1 2 1   Feeling bad or failure about yourself  0 0 0 0 0  Trouble concentrating 0 1 1 2  0  Moving slowly or fidgety/restless 0 0 0 0 0  Suicidal thoughts 0 0 0 0 0  PHQ-9 Score 0 2 5 4 4   Difficult doing work/chores  Not difficult at all Somewhat difficult      Allergies  Allergen Reactions   Ativan [Lorazepam] Other (See Comments)    hyperactivity   Metformin And Related    Penicillins Rash   Social History   Social History Narrative   Married.   2 children.   Some college.  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   Drinks caffeinated beverages.  Uses herbal remedies.   Smoke alarms in the home.  Wears her seatbelt.   Feels safe in her relationships.   Past Medical History:  Diagnosis Date   ADD (attention deficit disorder)    Angular cheilitis 03/13/2018   Arthritis    Chicken pox    Chronic neck pain    HNP   Depression    Headache    Heart murmur    as a baby   Insomnia    Nocturia    Sleep apnea    CPAP-SLEEP STUDY DONE ABOUT 2 YRS AGO   Thyroid disease    UTI (urinary tract infection)    Past Surgical History:  Procedure Laterality Date   ANTERIOR CERVICAL DECOMP/DISCECTOMY FUSION N/A 06/02/2014   Procedure: C6-7 Anterior Cervical Discectomy and Fusion, Allograft, Plate;  Surgeon: Eldred Manges, MD;  Location: [facility];  Service: Orthopedics;  Laterality: N/A;   CESAREAN SECTION  2004/2007   DILATION AND CURETTAGE OF UTERUS  2018  wisdom teeth extracted      Family History  Problem Relation Age of Onset   Arthritis Mother    Depression Mother    Drug abuse Mother    Hyperlipidemia Mother    Hypertension Mother    Alcohol abuse Father    Cancer - Cervical Sister    Breast cancer Maternal Grandmother    Thyroid nodules Maternal Grandmother    Heart disease Paternal Grandmother    Alcohol abuse Paternal Grandfather    Early death Son    Allergies as of 12-23-22       Reactions   Ativan [lorazepam] Other (See Comments)   hyperactivity   Metformin And Related    Penicillins Rash        Medication List        Accurate as of 12/23/2022  3:18 PM. If you have any questions, ask your nurse or doctor.          amphetamine-dextroamphetamine 20 MG tablet Commonly known as: ADDERALL Take 1 tablet (20 mg total) by mouth 2 (two) times daily.   amphetamine-dextroamphetamine 20 MG tablet Commonly known as: ADDERALL Take 1 tablet (20 mg total) by mouth 2 (two) times daily.   levocetirizine 5 MG tablet Commonly  known as: Xyzal Take 1 tablet (5 mg total) by mouth every evening.   levonorgestrel 20 MCG/24HR IUD Commonly known as: MIRENA 1 each by Intrauterine route once.   levothyroxine 88 MCG tablet Commonly known as: SYNTHROID Take 1 tablet (88 mcg total) by mouth daily before breakfast.   venlafaxine XR 150 MG 24 hr capsule Commonly known as: Effexor XR Take 1 capsule (150 mg total) by mouth daily with breakfast.        All past medical history, surgical history, allergies, family history, immunizations andmedications were updated in the EMR today and reviewed under the history and medication portions of their EMR.     ROS Negative, with the exception of above mentioned in HPI   Objective:  BP 102/78   Pulse 87   Temp 98.1 F (36.7 C)   Wt 218 lb 6.4 oz (99.1 kg)   SpO2 96%   BMI 46.44 kg/m  Body mass index is 46.44 kg/m. Physical Exam Vitals and nursing note reviewed.  Constitutional:      General: She is not in acute distress.    Appearance: Normal appearance. She is normal weight. She is not ill-appearing or toxic-appearing.  HENT:     Head: Normocephalic and atraumatic.  Eyes:     General: No scleral icterus.       Right eye: No discharge.        Left eye: No discharge.     Extraocular Movements: Extraocular movements intact.     Conjunctiva/sclera: Conjunctivae normal.     Pupils: Pupils are equal, round, and reactive to light.  Musculoskeletal:        General: Swelling, tenderness and signs of injury present.     Right hand: Swelling, laceration and tenderness present. Decreased range of motion. Decreased strength.     Comments: Thumb to finger abnl 4 and 5th digits. Sensation intact.   Skin:    Findings: No rash.     Comments: Rt hand: well approximated healing, no erythema or drainage. 13 nylon sutures between to bites of hand.  Rt leg: well approximated healing. No erythema, no drainage, no bleeding. 27 nylon sutures between 3 lacs of leg  Neurological:      Mental Status: She is  alert and oriented to person, place, and time. Mental status is at baseline.     Motor: No weakness.     Coordination: Coordination normal.     Gait: Gait normal.  Psychiatric:        Mood and Affect: Mood normal.        Behavior: Behavior normal.        Thought Content: Thought content normal.        Judgment: Judgment normal.      No results found. No results found. No results found for this or any previous visit (from the past 24 hour(s)).  Assessment/Plan: [name] is a 47 y.o. female present for OV for  Dog bite of hand and ankle without complication, initial encounter-Right Healing well, wound does not appear infectious.  Continue antibiotic to completion.  Tdap UTD 2 yrs ago  Monitor dog- UTD with rabies vac (personal pet) Daily wound dressings Work excuse today through Thursday, back on Friday.  Consider hand specialist referral if appropriate after follow up F/u Monday for wound check and suture removal.   Reviewed expectations re: course of current medical issues. Discussed self-management of symptoms. Outlined signs and symptoms indicating need for more acute intervention. Patient verbalized understanding and all questions were answered. Patient received an After-Visit Summary.    No orders of the defined types were placed in this encounter.  No orders of the defined types were placed in this encounter.  Referral Orders  No referral(s) requested today     Note is dictated utilizing voice recognition software. Although note has been proof read prior to signing, occasional typographical errors still can be missed. If any questions arise, please do not hesitate to call for verification.   electronically signed by:  Felix Pacini, DO  [facility] - OR

## 2022-12-22 ENCOUNTER — Encounter: Payer: Self-pay | Admitting: Family Medicine

## 2022-12-22 NOTE — Telephone Encounter (Signed)
OK TO EXTEND WORK EXCUSE TO RETURN TUESDAY, SINCE SUTURE REMOVAL IS MONDAY.

## 2022-12-22 NOTE — Telephone Encounter (Signed)
Updated letter sent in [facility]. Pt aware.

## 2022-12-26 ENCOUNTER — Ambulatory Visit: Payer: 59 | Admitting: Family Medicine

## 2022-12-26 ENCOUNTER — Encounter: Payer: Self-pay | Admitting: Family Medicine

## 2022-12-26 VITALS — BP 118/80 | HR 87 | Temp 98.4°F | Ht <= 58 in | Wt 220.2 lb

## 2022-12-26 DIAGNOSIS — S61259D Open bite of unspecified finger without damage to nail, subsequent encounter: Secondary | ICD-10-CM | POA: Diagnosis not present

## 2022-12-26 DIAGNOSIS — S91051D Open bite, right ankle, subsequent encounter: Secondary | ICD-10-CM

## 2022-12-26 DIAGNOSIS — S61451D Open bite of right hand, subsequent encounter: Secondary | ICD-10-CM

## 2022-12-26 DIAGNOSIS — W540XXD Bitten by dog, subsequent encounter: Secondary | ICD-10-CM | POA: Diagnosis not present

## 2022-12-26 NOTE — Patient Instructions (Addendum)
Return in about 1 week (around 01/02/2023), or if symptoms worsen or fail to improve.        Great to see you today.  I have refilled the medication(s) we provide.   If labs were collected, we will inform you of lab results once received either by echart message or telephone call.   - echart message- for normal results that have been seen by the patient already.   - telephone call: abnormal results or if patient has not viewed results in their echart.

## 2022-12-26 NOTE — Progress Notes (Signed)
[name] , November 27, 1975, 47 y.o., female MRN: [id number] Patient Care Team    Relationship Specialty Notifications Start End  Natalia Leatherwood, DO PCP - General Family Medicine  10/03/17   Peterson Ao, MD Referring Physician Internal Medicine  10/04/17   Tobias Alexander, OD Referring Physician Optometry  10/04/17   Consuello Closs., MD Referring Physician Obstetrics and Gynecology  10/04/17     Chief Complaint  Patient presents with   Animal Bite    Pt presents for suture removal     Subjective: [name] is a 47 y.o. Pt presents for an OV to follow up on dog bite in multiple locations-right hand and right ankle that occurred late July 5th. Tdap UTD 08/2020. Her small dog bit her. Rabies vaccine UTD.  Pt was seen at an UC. She reports xray was normal. She was told there was no tendon or nerve injury.  She has finished her abx tx.  She is have difficulty standing/walking d/t location of right lower leg lac. Working is difficult with standing and use of her right hand is limited. She has been out of work during this time.  Today she reports overall she feels it is healing ok. Mild serous drainage, occasional bloody discharge.     12/19/2022    2:32 PM 07/20/2022    9:59 AM 01/26/2022    1:34 PM 08/24/2021    9:58 AM 02/02/2021    9:35 AM  Depression screen PHQ 2/9  Decreased Interest 0 0 0 0 0  Down, Depressed, Hopeless 0 0 0 0 0  PHQ - 2 Score 0 0 0 0 0  Altered sleeping 0 0 2 0 2  Tired, decreased energy 0 0 1 0 1  Change in appetite 0 1 1 2 1   Feeling bad or failure about yourself  0 0 0 0 0  Trouble concentrating 0 1 1 2  0  Moving slowly or fidgety/restless 0 0 0 0 0  Suicidal thoughts 0 0 0 0 0  PHQ-9 Score 0 2 5 4 4   Difficult doing work/chores  Not difficult at all Somewhat difficult      Allergies  Allergen Reactions   Ativan [Lorazepam] Other (See Comments)    hyperactivity   Metformin And Related    Penicillins Rash   Social History    Social History Narrative   Married.  2 children.   Some college.  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   Drinks caffeinated beverages.  Uses herbal remedies.   Smoke alarms in the home.  Wears her seatbelt.   Feels safe in her relationships.   Past Medical History:  Diagnosis Date   ADD (attention deficit disorder)    Angular cheilitis 03/13/2018   Arthritis    Chicken pox    Chronic neck pain    HNP   Depression    Headache    Heart murmur    as a baby   Insomnia    Nocturia    Sleep apnea    CPAP-SLEEP STUDY DONE ABOUT 2 YRS AGO   Thyroid disease    UTI (urinary tract infection)    Past Surgical History:  Procedure Laterality Date   ANTERIOR CERVICAL DECOMP/DISCECTOMY FUSION N/A 06/02/2014   Procedure: C6-7 Anterior Cervical Discectomy and Fusion, Allograft, Plate;  Surgeon: Eldred Manges, MD;  Location: [facility] OR;  Service: Orthopedics;  Laterality: N/A;   CESAREAN SECTION  2004/2007   DILATION AND CURETTAGE OF  UTERUS  2018   wisdom teeth extracted      Family History  Problem Relation Age of Onset   Arthritis Mother    Depression Mother    Drug abuse Mother    Hyperlipidemia Mother    Hypertension Mother    Alcohol abuse Father    Cancer - Cervical Sister    Breast cancer Maternal Grandmother    Thyroid nodules Maternal Grandmother    Heart disease Paternal Grandmother    Alcohol abuse Paternal Grandfather    Early death Son    Allergies as of 01/23/2023       Reactions   Ativan [lorazepam] Other (See Comments)   hyperactivity   Metformin And Related    Penicillins Rash        Medication List        Accurate as of 23-Jan-2023  2:41 PM. If you have any questions, ask your nurse or doctor.          amphetamine-dextroamphetamine 20 MG tablet Commonly known as: ADDERALL Take 1 tablet (20 mg total) by mouth 2 (two) times daily.   amphetamine-dextroamphetamine 20 MG tablet Commonly known as: ADDERALL Take 1 tablet (20 mg total) by mouth 2 (two) times  daily.   levocetirizine 5 MG tablet Commonly known as: Xyzal Take 1 tablet (5 mg total) by mouth every evening.   levonorgestrel 20 MCG/24HR IUD Commonly known as: MIRENA 1 each by Intrauterine route once.   levothyroxine 88 MCG tablet Commonly known as: SYNTHROID Take 1 tablet (88 mcg total) by mouth daily before breakfast.   venlafaxine XR 150 MG 24 hr capsule Commonly known as: Effexor XR Take 1 capsule (150 mg total) by mouth daily with breakfast.        All past medical history, surgical history, allergies, family history, immunizations andmedications were updated in the EMR today and reviewed under the history and medication portions of their EMR.     ROS Negative, with the exception of above mentioned in HPI   Objective:  BP 118/80   Pulse 87   Temp 98.4 F (36.9 C) (Oral)   Ht 4' 9.5" (1.461 m)   Wt 220 lb 3.2 oz (99.9 kg)   SpO2 97%   BMI 46.83 kg/m  Body mass index is 46.83 kg/m. Physical Exam Vitals and nursing note reviewed.  Constitutional:      General: She is not in acute distress.    Appearance: Normal appearance. She is normal weight. She is not ill-appearing or toxic-appearing.  HENT:     Head: Normocephalic and atraumatic.  Eyes:     General: No scleral icterus.       Right eye: No discharge.        Left eye: No discharge.     Extraocular Movements: Extraocular movements intact.     Conjunctiva/sclera: Conjunctivae normal.     Pupils: Pupils are equal, round, and reactive to light.  Musculoskeletal:        General: Swelling (mild- improved) and signs of injury present. No tenderness.     Right hand: Swelling and laceration present. Decreased range of motion. Decreased strength.     Comments: ROM improved.   Skin:    Findings: No rash.     Comments: Rt hand: well approximated healing, no erythema or drainage. 13 nylon sutures between to bites of hand.  Rt leg: no bleeding. 27 nylon sutures between 3 lacs of leg. Lateral lac with wound  dehiscence. Otherwise adequate healing. Mild swelling  remains.   Neurological:     Mental Status: She is alert and oriented to person, place, and time. Mental status is at baseline.     Motor: No weakness.     Coordination: Coordination normal.     Gait: Gait normal.  Psychiatric:        Mood and Affect: Mood normal.        Behavior: Behavior normal.        Thought Content: Thought content normal.        Judgment: Judgment normal.      No results found. No results found. No results found for this or any previous visit (from the past 24 hour(s)).  Assessment/Plan: [name] is a 47 y.o. female present for OV for  Dog bite of hand and ankle without complication, subsequent encounter-Right - wounds moistened with sterile saline soaked guaze 10 min.  - x 40 sutures removal completed on hand and leg lacerations.  - steri strips applied to thumb, hand and leg lacerations. Pt was instructed to leave steri in place, allowing to fall of their own.  - monitor right lateral laceration for signs of infection. This area contained 4 dehisced sutures. - may shower daily, cleaning other healing lacs with war soapy water and covering in BB ointment.  - f/u in 1 week to recheck lateral leg wound, sooner if needed.    Reviewed expectations re: course of current medical issues. Discussed self-management of symptoms. Outlined signs and symptoms indicating need for more acute intervention. Patient verbalized understanding and all questions were answered. Patient received an After-Visit Summary.  43 minutes spent treating  patient on day of service   No orders of the defined types were placed in this encounter.  No orders of the defined types were placed in this encounter.  Referral Orders  No referral(s) requested today     Note is dictated utilizing voice recognition software. Although note has been proof read prior to signing, occasional typographical errors still can be missed.  If any questions arise, please do not hesitate to call for verification.   electronically signed by:  Felix Pacini, DO  [facility] [place]

## 2023-01-02 ENCOUNTER — Encounter: Payer: Self-pay | Admitting: Family Medicine

## 2023-01-02 ENCOUNTER — Ambulatory Visit: Payer: 59 | Admitting: Family Medicine

## 2023-01-02 VITALS — BP 121/82 | HR 81 | Temp 98.0°F | Wt 218.2 lb

## 2023-01-02 DIAGNOSIS — S91051D Open bite, right ankle, subsequent encounter: Secondary | ICD-10-CM | POA: Diagnosis not present

## 2023-01-02 DIAGNOSIS — W540XXD Bitten by dog, subsequent encounter: Secondary | ICD-10-CM | POA: Diagnosis not present

## 2023-01-02 DIAGNOSIS — S61259D Open bite of unspecified finger without damage to nail, subsequent encounter: Secondary | ICD-10-CM | POA: Diagnosis not present

## 2023-01-02 DIAGNOSIS — S61451D Open bite of right hand, subsequent encounter: Secondary | ICD-10-CM

## 2023-01-02 NOTE — Progress Notes (Signed)
[name] , Jun 25, 1975, 47 y.o., female MRN: [id number] Patient Care Team    Relationship Specialty Notifications Start End  Natalia Leatherwood, DO PCP - General Family Medicine  10/03/17   Peterson Ao, MD Referring Physician Internal Medicine  10/04/17   Tobias Alexander, OD Referring Physician Optometry  10/04/17   Consuello Closs., MD Referring Physician Obstetrics and Gynecology  10/04/17     Chief Complaint  Patient presents with   Animal Bite    Currently on abx for leg wound. Went to [place] [facility] on 07/19     Subjective: Delbra Zellars is a 47 y.o. Pt presents for an OV to follow up on dog bite in multiple locations-right hand and right ankle that occurred late July 5th. Tdap UTD 08/2020. Her small dog bit her. Rabies vaccine UTD.  Pt was seen at an UC. She reports xray was normal. She was told there was no tendon or nerve injury.  She has finished her abx tx.  She is have difficulty standing/walking d/t location of right lower leg lac. Working is difficult with standing and use of her right hand is limited. She had been out of work until stitches removed 1 week ago.  Steri-Strips were applied to tension areas at that time.  She did have a small dehiscence noted at that time from medial calf/vertical sutures. She is back for recheck today and reports she was seen UC 7/19 for concerns and was placed on abx- clindamycin.      12/19/2022    2:32 PM 07/20/2022    9:59 AM 01/26/2022    1:34 PM 08/24/2021    9:58 AM 02/02/2021    9:35 AM  Depression screen PHQ 2/9  Decreased Interest 0 0 0 0 0  Down, Depressed, Hopeless 0 0 0 0 0  PHQ - 2 Score 0 0 0 0 0  Altered sleeping 0 0 2 0 2  Tired, decreased energy 0 0 1 0 1  Change in appetite 0 1 1 2 1   Feeling bad or failure about yourself  0 0 0 0 0  Trouble concentrating 0 1 1 2  0  Moving slowly or fidgety/restless 0 0 0 0 0  Suicidal thoughts 0 0 0 0 0  PHQ-9 Score 0 2 5 4 4   Difficult doing work/chores  Not  difficult at all Somewhat difficult      Allergies  Allergen Reactions   Ativan [Lorazepam] Other (See Comments)    hyperactivity   Metformin And Related    Penicillins Rash   Social History   Social History Narrative   Married.  2 children.   Some college.  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   Drinks caffeinated beverages.  Uses herbal remedies.   Smoke alarms in the home.  Wears her seatbelt.   Feels safe in her relationships.   Past Medical History:  Diagnosis Date   ADD (attention deficit disorder)    Angular cheilitis 03/13/2018   Arthritis    Chicken pox    Chronic neck pain    HNP   Depression    Headache    Heart murmur    as a baby   Insomnia    Nocturia    Sleep apnea    CPAP-SLEEP STUDY DONE ABOUT 2 YRS AGO   Thyroid disease    UTI (urinary tract infection)    Past Surgical History:  Procedure Laterality Date   ANTERIOR CERVICAL DECOMP/DISCECTOMY FUSION N/A  06/02/2014   Procedure: C6-7 Anterior Cervical Discectomy and Fusion, Allograft, Plate;  Surgeon: Eldred Manges, MD;  Location: [facility] OR;  Service: Orthopedics;  Laterality: N/A;   CESAREAN SECTION  2004/2007   DILATION AND CURETTAGE OF UTERUS  2018   wisdom teeth extracted      Family History  Problem Relation Age of Onset   Arthritis Mother    Depression Mother    Drug abuse Mother    Hyperlipidemia Mother    Hypertension Mother    Alcohol abuse Father    Cancer - Cervical Sister    Breast cancer Maternal Grandmother    Thyroid nodules Maternal Grandmother    Heart disease Paternal Grandmother    Alcohol abuse Paternal Grandfather    Early death Son    Allergies as of 01/04/23       Reactions   Ativan [lorazepam] Other (See Comments)   hyperactivity   Metformin And Related    Penicillins Rash        Medication List        Accurate as of 01-04-23  9:07 AM. If you have any questions, ask your nurse or doctor.          amphetamine-dextroamphetamine 20 MG tablet Commonly known as:  ADDERALL Take 1 tablet (20 mg total) by mouth 2 (two) times daily.   amphetamine-dextroamphetamine 20 MG tablet Commonly known as: ADDERALL Take 1 tablet (20 mg total) by mouth 2 (two) times daily.   levocetirizine 5 MG tablet Commonly known as: Xyzal Take 1 tablet (5 mg total) by mouth every evening.   levonorgestrel 20 MCG/24HR IUD Commonly known as: MIRENA 1 each by Intrauterine route once.   levothyroxine 88 MCG tablet Commonly known as: SYNTHROID Take 1 tablet (88 mcg total) by mouth daily before breakfast.   venlafaxine XR 150 MG 24 hr capsule Commonly known as: Effexor XR Take 1 capsule (150 mg total) by mouth daily with breakfast.        All past medical history, surgical history, allergies, family history, immunizations andmedications were updated in the EMR today and reviewed under the history and medication portions of their EMR.     ROS Negative, with the exception of above mentioned in HPI   Objective:  BP 121/82   Pulse 81   Temp 98 F (36.7 C)   Wt 218 lb 3.2 oz (99 kg)   SpO2 97%   BMI 46.40 kg/m  Body mass index is 46.4 kg/m. Physical Exam Vitals and nursing note reviewed.  Constitutional:      General: She is not in acute distress.    Appearance: Normal appearance. She is normal weight. She is not ill-appearing or toxic-appearing.  HENT:     Head: Normocephalic and atraumatic.  Eyes:     General: No scleral icterus.       Right eye: No discharge.        Left eye: No discharge.     Extraocular Movements: Extraocular movements intact.     Conjunctiva/sclera: Conjunctivae normal.     Pupils: Pupils are equal, round, and reactive to light.  Musculoskeletal:        General: No swelling (mild- improved), tenderness or signs of injury.     Right hand: No swelling or lacerations. Normal range of motion. Normal strength.     Comments: ROM improved.   Skin:    Findings: No rash.     Comments: Rt hand: well approximated healing, no erythema or  drainage.  Rt leg: no bleeding.-horizontal wound granulating nicely. Lateral "L" shaped wound appears more open than last week.. no erythema, no purulent drainage  Neurological:     Mental Status: She is alert and oriented to person, place, and time. Mental status is at baseline.     Motor: No weakness.     Coordination: Coordination normal.     Gait: Gait normal.  Psychiatric:        Mood and Affect: Mood normal.        Behavior: Behavior normal.        Thought Content: Thought content normal.        Judgment: Judgment normal.      No results found. No results found. No results found for this or any previous visit (from the past 24 hour(s)).  Assessment/Plan: Sandra Brents is a 47 y.o. female present for OV for  Dog bite of hand and ankle without complication, subsequent encounter-Right - steri strips also pulled off left lower ext. - horizontal wound healing by secondary intention/granulation nicely. Lower lateral wond, does not appear infected, but will require wound care assistance.  - may shower daily, cleaning other healing lacs with war soapy water and covering in BB ointment and bandage daily.     Reviewed expectations re: course of current medical issues. Discussed self-management of symptoms. Outlined signs and symptoms indicating need for more acute intervention. Patient verbalized understanding and all questions were answered. Patient received an After-Visit Summary.    Orders Placed This Encounter  Procedures   AMB referral to wound care center   No orders of the defined types were placed in this encounter.  Referral Orders         AMB referral to wound care center       Note is dictated utilizing voice recognition software. Although note has been proof read prior to signing, occasional typographical errors still can be missed. If any questions arise, please do not hesitate to call for verification.   electronically signed by:  Felix Pacini, DO   [facility] [place]

## 2023-01-02 NOTE — Patient Instructions (Signed)
No follow-ups on file.        Great to see you today.  I have refilled the medication(s) we provide.   If labs were collected, we will inform you of lab results once received either by echart message or telephone call.   - echart message- for normal results that have been seen by the patient already.   - telephone call: abnormal results or if patient has not viewed results in their echart.  

## 2023-01-03 ENCOUNTER — Encounter: Payer: Self-pay | Admitting: Family Medicine

## 2023-01-09 NOTE — Telephone Encounter (Signed)
No further action needed.

## 2023-01-11 ENCOUNTER — Encounter (INDEPENDENT_AMBULATORY_CARE_PROVIDER_SITE_OTHER): Payer: Self-pay

## 2023-01-13 ENCOUNTER — Other Ambulatory Visit: Payer: Self-pay | Admitting: Family Medicine

## 2023-01-16 ENCOUNTER — Encounter (HOSPITAL_BASED_OUTPATIENT_CLINIC_OR_DEPARTMENT_OTHER): Payer: 59 | Attending: General Surgery | Admitting: Internal Medicine

## 2023-01-16 DIAGNOSIS — E063 Autoimmune thyroiditis: Secondary | ICD-10-CM | POA: Diagnosis not present

## 2023-01-16 DIAGNOSIS — G4730 Sleep apnea, unspecified: Secondary | ICD-10-CM | POA: Insufficient documentation

## 2023-01-16 DIAGNOSIS — W540XXA Bitten by dog, initial encounter: Secondary | ICD-10-CM | POA: Insufficient documentation

## 2023-01-16 DIAGNOSIS — Z833 Family history of diabetes mellitus: Secondary | ICD-10-CM | POA: Diagnosis present

## 2023-01-16 DIAGNOSIS — L97812 Non-pressure chronic ulcer of other part of right lower leg with fat layer exposed: Secondary | ICD-10-CM | POA: Diagnosis present

## 2023-01-16 NOTE — Progress Notes (Signed)
[name], [name] [name] ([id number]) [id number] Page 1 of 11 Visit Report for 01/16/2023 Allergy List Details Patient Name: Date of Service: [name] [facility] [name] 01/16/2023 9:30 [name] M Medical Record Number: [id number] Patient Account Number: [id number] Date of Birth/Sex: Treating RN: Mar 07, 1976 (47 y.o. Arta Silence Primary Care Sundance Moise: Felix Pacini Other Clinician: Referring Zeddie Njie: Treating Williamson Cavanah/Extender: Genene Churn, Renee Weeks in Treatment: 0 Allergies Active Allergies Ativan metformin penicillin Allergy Notes Electronic Signature(s) Signed: 01/16/2023 5:04:15 PM By: Thayer Dallas Entered By: Thayer Dallas on 01/16/2023 09:33:58 -------------------------------------------------------------------------------- Arrival Information Details Patient Name: Date of Service: [name], [name] 01/16/2023 9:30 [name] M Medical Record Number: [id number] Patient Account Number: [id number] Date of Birth/Sex: Treating RN: 07/04/1975 (47 y.o. F) Primary Care Sharlotte Baka: Felix Pacini Other Clinician: Referring Jeilyn Reznik: Treating Kayston Jodoin/Extender: Delfin Edis in Treatment: 0 Visit Information Patient Arrived: Ambulatory Arrival Time: 09:22 Accompanied By: fiance Transfer Assistance: None Patient Identification Verified: Yes Secondary Verification Process Completed: Yes Patient Requires Transmission-Based Precautions: No Patient Has Alerts: Yes Patient Alerts: Patient on Blood Thinner Electronic Signature(s) Signed: 01/16/2023 5:04:15 PM By: Thayer Dallas Entered By: Thayer Dallas on 01/16/2023 09:31:21 [name] ([id number]) [id number] Page 2 of 11 -------------------------------------------------------------------------------- Clinic Level of Care Assessment Details Patient Name: Date of Service: [name] [name] 01/16/2023 9:30 [name] M Medical Record Number: [id number] Patient Account  Number: [id number] Date of Birth/Sex: Treating RN: Nov 14, 1975 (47 y.o. Arta Silence Primary Care Kimimila Tauzin: Felix Pacini Other Clinician: Referring Millie Shorb: Treating Dymond Gutt/Extender: Genene Churn, Renee Weeks in Treatment: 0 Clinic Level of Care Assessment Items TOOL 1 Quantity Score X- 1 0 Use when EandM and Procedure is performed on INITIAL visit ASSESSMENTS - Nursing Assessment / Reassessment X- 1 20 General Physical Exam (combine w/ comprehensive assessment (listed just below) when performed on new pt. evals) X- 1 25 Comprehensive Assessment (HX, ROS, Risk Assessments, Wounds Hx, etc.) ASSESSMENTS - Wound and Skin Assessment / Reassessment X- 1 10 Dermatologic / Skin Assessment (not related to wound area) ASSESSMENTS - Ostomy and/or Continence Assessment and Care []  - 0 Incontinence Assessment and Management []  - 0 Ostomy Care Assessment and Management (repouching, etc.) PROCESS - Coordination of Care []  - 0 Simple Patient / Family Education for ongoing care X- 1 20 Complex (extensive) Patient / Family Education for ongoing care X- 1 10 Staff obtains Chiropractor, Records, T Results / Process Orders est []  - 0 Staff telephones HHA, Nursing Homes / Clarify orders / etc []  - 0 Routine Transfer to another Facility (non-emergent condition) []  - 0 Routine Hospital Admission (non-emergent condition) X- 1 15 New Admissions / Manufacturing engineer / Ordering NPWT Apligraf, etc. , []  - 0 Emergency Hospital Admission (emergent condition) PROCESS - Special Needs []  - 0 Pediatric / Minor Patient Management []  - 0 Isolation Patient Management []  - 0 Hearing / Language / Visual special needs []  - 0 Assessment of Community assistance (transportation, D/C planning, etc.) []  - 0 Additional assistance / Altered mentation []  - 0 Support Surface(s) Assessment (bed, cushion, seat, etc.) INTERVENTIONS - Miscellaneous []  - 0 External ear exam []  - 0 Patient  Transfer (multiple staff / Nurse, adult / Similar devices) []  - 0 Simple Staple / Suture removal (25 or less) []  - 0 Complex Staple / Suture removal (26 or more) []  - 0 Hypo/Hyperglycemic Management (do not check if billed separately) X- 1 15 Ankle / Brachial Index (ABI) - do not check if billed separately Has the patient been  seen at the hospital within the last three years: Yes Total Score: 115 Level Of Care: New/Established - Level 3 Electronic Signature(s) Signed: 01/16/2023 2:10:51 PM By: Shawn Stall RN, BSN [name], [name] ([id number]) By: Shawn Stall RN, BSN [phone].pdf Page 3 of 11 Signed: 01/16/2023 2:10:51 PM Entered By: Shawn Stall on 01/16/2023 10:32:42 -------------------------------------------------------------------------------- Encounter Discharge Information Details Patient Name: Date of Service: [name] [name] 01/16/2023 9:30 [name] M Medical Record Number: [id number] Patient Account Number: [id number] Date of Birth/Sex: Treating RN: 25-Nov-1975 (47 y.o. Arta Silence Primary Care Afsa Meany: Felix Pacini Other Clinician: Referring Alejandra Barna: Treating Makynna Manocchio/Extender: Lajuana Carry Weeks in Treatment: 0 Encounter Discharge Information Items Post Procedure Vitals Discharge Condition: Stable Temperature (F): 98.5 Ambulatory Status: Ambulatory Pulse (bpm): 90 Discharge Destination: Home Respiratory Rate (breaths/min): 18 Transportation: Private Auto Blood Pressure (mmHg): 128/87 Accompanied By: boyfriend Schedule Follow-up Appointment: Yes Clinical Summary of Care: Electronic Signature(s) Signed: 01/16/2023 2:10:51 PM By: Shawn Stall RN, BSN Entered By: Shawn Stall on 01/16/2023 10:33:31 -------------------------------------------------------------------------------- Lower Extremity Assessment Details Patient Name: Date of Service: [name] 01/16/2023 9:30 [name] M Medical Record Number:  [id number] Patient Account Number: [id number] Date of Birth/Sex: Treating RN: [birth year] (47 y.o. F) Primary Care Donicia Druck: Felix Pacini Other Clinician: Referring Eshaal Duby: Treating December Hedtke/Extender: Genene Churn, Renee Weeks in Treatment: 0 Edema Assessment Assessed: [Left: No] [Right: No] [Left: Edema] [Right: :] Calf Left: Right: Point of Measurement: 35.5 cm From Medial Instep Ankle Left: Right: Point of Measurement: 9 cm From Medial Instep Knee To Floor Left: Right: From Medial Instep 35.5 cm Vascular Assessment Pulses: Dorsalis Pedis Palpable: [Right:Yes] Doppler Audible: [Right:Yes] Alessio, [name] ([id number]) [Right:[id number] Page 4 of 11] Posterior Tibial Palpable: [Right:Yes] Doppler Audible: [Right:Yes] Extremity colors, hair growth, and conditions: Extremity Color: [Right:Normal] Hair Growth on Extremity: [Right:No] Temperature of Extremity: [Right:Warm] Capillary Refill: [Right:< 3 seconds] Dependent Rubor: [Right:No] Blanched when Elevated: [Right:No] Lipodermatosclerosis: [Right:No] Blood Pressure: Brachial: [Right:128] Ankle: [Right:Dorsalis Pedis: 115 0.90] Toe Nail Assessment Left: Right: Thick: No Discolored: No Deformed: No Improper Length and Hygiene: No Electronic Signature(s) Signed: 01/16/2023 5:04:15 PM By: Thayer Dallas Entered By: Thayer Dallas on 01/16/2023 10:12:51 -------------------------------------------------------------------------------- Multi Wound Chart Details Patient Name: Date of Service: [name], [name] 01/16/2023 9:30 [name] M Medical Record Number: [id number] Patient Account Number: [id number] Date of Birth/Sex: Treating RN: 10-03-1975 (47 y.o. F) Primary Care Wilgus Deyton: Felix Pacini Other Clinician: Referring Tyrene Nader: Treating Polo Mcmartin/Extender: Genene Churn, Renee Weeks in Treatment: 0 Vital Signs Height(in): 59 Pulse(bpm): 90 Weight(lbs): 218 Blood  Pressure(mmHg): 128/87 Body Mass Index(BMI): 44 Temperature(F): 98.5 Respiratory Rate(breaths/min): 18 [1:Photos:] [N/[name]:N/[name]] Right, Lateral Lower Leg Right, Posterior Lower Leg N/[name] Wound Location: Bite Bite N/[name] Wounding Event: Trauma, Other Trauma, Other N/[name] Primary Etiology: Sleep Apnea, Confinement Anxiety Sleep Apnea, Confinement Anxiety N/[name] Comorbid History: 12/16/2022 12/16/2022 N/[name] Date Acquired: 0 0 N/[name] Weeks of Treatment: Open Open N/[name] Wound Status: No No N/[name] Wound Recurrence: 1.2x2x0.3 0.5x5x0.2 N/[name] Measurements L x W x D (cm) 1.885 1.963 N/[name] [name] (cm) : rea 0.565 0.393 N/[name] Volume (cm) : Full Thickness Without Exposed Full Thickness Without Exposed N/[name] Classification: Support Structures Support Structures [name], [name] ([id number]) [id number] Page 5 of 11 Medium Medium N/[name] Exudate Amount: Serosanguineous Serosanguineous N/[name] Exudate Type: red, brown red, brown N/[name] Exudate Color: Distinct, outline attached Distinct, outline attached N/[name] Wound Margin: Small (1-33%) Small (1-33%) N/[name] Granulation Amount: Red Red N/[name] Granulation Quality: Large (67-100%) Large (67-100%) N/[name] Necrotic Amount: Fat Layer (Subcutaneous  Tissue): Yes Fat Layer (Subcutaneous Tissue): Yes N/[name] Exposed Structures: Fascia: No Fascia: No Tendon: No Tendon: No Muscle: No Muscle: No Joint: No Joint: No Bone: No Bone: No None None N/[name] Epithelialization: Debridement - Excisional Debridement - Excisional N/[name] Debridement: Pre-procedure Verification/Time Out 10:20 10:20 N/[name] Taken: Lidocaine 4% Topical Solution Lidocaine 4% Topical Solution N/[name] Pain Control: Subcutaneous, Slough Subcutaneous, Slough N/[name] Tissue Debrided: Skin/Subcutaneous Tissue Skin/Subcutaneous Tissue N/[name] Level: 1.88 1.96 N/[name] Debridement [name] (sq cm): rea Curette Curette N/[name] Instrument: Minimum Minimum N/[name] Bleeding: Pressure Pressure N/[name] Hemostasis [name] chieved: 0 0 N/[name] Procedural  Pain: 0 0 N/[name] Post Procedural Pain: Procedure was tolerated well Procedure was tolerated well N/[name] Debridement Treatment Response: 1.2x2x0.3 0.5x5x0.2 N/[name] Post Debridement Measurements L x W x D (cm) 0.565 0.393 N/[name] Post Debridement Volume: (cm) Excoriation: No Excoriation: No N/[name] Periwound Skin Texture: Induration: No Induration: No Callus: No Callus: No Crepitus: No Crepitus: No Rash: No Rash: No Scarring: No Scarring: No Maceration: No Maceration: No N/[name] Periwound Skin Moisture: Dry/Scaly: No Dry/Scaly: No Atrophie Blanche: No Atrophie Blanche: No N/[name] Periwound Skin Color: Cyanosis: No Cyanosis: No Ecchymosis: No Ecchymosis: No Erythema: No Erythema: No Hemosiderin Staining: No Hemosiderin Staining: No Mottled: No Mottled: No Pallor: No Pallor: No Rubor: No Rubor: No Debridement Debridement N/[name] Procedures Performed: Treatment Notes Electronic Signature(s) Signed: 01/16/2023 4:29:36 PM By: Geralyn Corwin DO Entered By: Geralyn Corwin on 01/16/2023 10:32:27 -------------------------------------------------------------------------------- Multi-Disciplinary Care Plan Details Patient Name: Date of Service: [name], [name] 01/16/2023 9:30 [name] M Medical Record Number: [id number] Patient Account Number: [id number] Date of Birth/Sex: Treating RN: April 16, 1976 (47 y.o. Arta Silence Primary Care Clair Bardwell: Felix Pacini Other Clinician: Referring Chisom Muntean: Treating Rosy Estabrook/Extender: Lajuana Carry Weeks in Treatment: 0 Active Inactive Necrotic Tissue Nursing Diagnoses: Knowledge deficit related to management of necrotic/devitalized tissue Goals: [name], [name] ([id number]) [id number] Page 6 of 11 Necrotic/devitalized tissue will be minimized in the wound bed Date Initiated: 01/16/2023 Target Resolution Date: 02/17/2023 Goal Status: Active Patient/caregiver will verbalize understanding of reason and process for  debridement of necrotic tissue Date Initiated: 01/16/2023 Target Resolution Date: 02/16/2023 Goal Status: Active Interventions: Assess patient pain level pre-, during and post procedure and prior to discharge Provide education on necrotic tissue and debridement process Treatment Activities: Apply topical anesthetic as ordered : 01/16/2023 Biologic debridement : 01/16/2023 Notes: Nutrition Nursing Diagnoses: Potential for alteratiion in Nutrition/Potential for imbalanced nutrition Goals: Patient/caregiver agrees to and verbalizes understanding of need to obtain nutritional consultation Date Initiated: 01/16/2023 Target Resolution Date: 02/16/2023 Goal Status: Active Interventions: Assess patient nutrition upon admission and as needed per policy Provide education on nutrition Treatment Activities: Patient referred to Primary Care Physician for further nutritional evaluation : 01/16/2023 Notes: Orientation to the Wound Care Program Nursing Diagnoses: Knowledge deficit related to the wound healing center program Goals: Patient/caregiver will verbalize understanding of the Wound Healing Center Program Date Initiated: 01/16/2023 Target Resolution Date: 02/17/2023 Goal Status: Active Interventions: Provide education on orientation to the wound center Notes: Wound/Skin Impairment Nursing Diagnoses: Knowledge deficit related to ulceration/compromised skin integrity Goals: Patient/caregiver will verbalize understanding of skin care regimen Date Initiated: 01/16/2023 Target Resolution Date: 02/17/2023 Goal Status: Active Interventions: Assess patient/caregiver ability to perform ulcer/skin care regimen upon admission and as needed Assess ulceration(s) every visit Provide education on ulcer and skin care Treatment Activities: Skin care regimen initiated : 01/16/2023 Topical wound management initiated : 01/16/2023 Notes: Electronic Signature(s) Signed: 01/16/2023 2:10:51 PM By: Shawn Stall RN,  BSN Entered By: Shawn Stall  on 01/16/2023 10:32:07 [name], [name] ([id number]) [phone].pdf Page 7 of 11 -------------------------------------------------------------------------------- Pain Assessment Details Patient Name: Date of Service: [name] [facility] [name] 01/16/2023 9:30 [name] M Medical Record Number: [id number] Patient Account Number: [id number] Date of Birth/Sex: Treating RN: [birth year] (47 y.o. Arta Silence Primary Care Danel Requena: Felix Pacini Other Clinician: Referring Emely Fahy: Treating Sean Malinowski/Extender: Genene Churn, Renee Weeks in Treatment: 0 Active Problems Location of Pain Severity and Description of Pain Patient Has Paino No Site Locations Pain Management and Medication Current Pain Management: Electronic Signature(s) Signed: 01/16/2023 2:10:51 PM By: Shawn Stall RN, BSN Entered By: Shawn Stall on 01/16/2023 10:26:22 -------------------------------------------------------------------------------- Patient/Caregiver Education Details Patient Name: Date of Service: Tillman Abide 8/5/2024andnbsp9:30 [name] M Medical Record Number: [id number] Patient Account Number: [id number] Date of Birth/Gender: Treating RN: [birth year] (47 y.o. Arta Silence Primary Care Physician: Felix Pacini Other Clinician: Referring Physician: Treating Physician/Extender: Delfin Edis in Treatment: 0 Education Assessment Education Provided To: Patient Education Topics Provided Wound/Skin Impairment: Handouts: Caring for Your Ulcer Methods: Explain/Verbal Responses: Reinforcements needed [name], [name] ([id number]) [id number] Page 8 of 11 Electronic Signature(s) Signed: 01/16/2023 2:10:51 PM By: Shawn Stall RN, BSN Entered By: Shawn Stall on 01/16/2023 10:32:15 -------------------------------------------------------------------------------- Wound Assessment Details Patient Name: Date of  Service: [name] 01/16/2023 9:30 [name] M Medical Record Number: [id number] Patient Account Number: [id number] Date of Birth/Sex: Treating RN: 28-Jul-1975 (47 y.o. F) Primary Care Shae Augello: Felix Pacini Other Clinician: Referring Alfonza Toft: Treating Korbin Mapps/Extender: Genene Churn, Renee Weeks in Treatment: 0 Wound Status Wound Number: 1 Primary Etiology: Trauma, Other Wound Location: Right, Lateral Lower Leg Wound Status: Open Wounding Event: Bite Comorbid History: Sleep Apnea, Confinement Anxiety Date Acquired: 12/16/2022 Weeks Of Treatment: 0 Clustered Wound: No Photos Wound Measurements Length: (cm) 1.2 Width: (cm) 2 Depth: (cm) 0.3 Area: (cm) 1.885 Volume: (cm) 0.565 % Reduction in Area: % Reduction in Volume: Epithelialization: None Tunneling: No Undermining: No Wound Description Classification: Full Thickness Without Exposed Suppor Wound Margin: Distinct, outline attached Exudate Amount: Medium Exudate Type: Serosanguineous Exudate Color: red, brown t Structures Foul Odor After Cleansing: No Slough/Fibrino Yes Wound Bed Granulation Amount: Small (1-33%) Exposed Structure Granulation Quality: Red Fascia Exposed: No Necrotic Amount: Large (67-100%) Fat Layer (Subcutaneous Tissue) Exposed: Yes Necrotic Quality: Adherent Slough Tendon Exposed: No Muscle Exposed: No Joint Exposed: No Bone Exposed: No Periwound Skin Texture Texture Color No Abnormalities Noted: No No Abnormalities Noted: No Callus: No Atrophie Blanche: No Crepitus: No Cyanosis: No [name], [name] ([id number]) [id number] Page 9 of 11 Excoriation: No Ecchymosis: No Induration: No Erythema: No Rash: No Hemosiderin Staining: No Scarring: No Mottled: No Pallor: No Moisture Rubor: No No Abnormalities Noted: No Dry / Scaly: No Maceration: No Treatment Notes Wound #1 (Lower Leg) Wound Laterality: Right, Lateral Cleanser Soap and  Water Discharge Instruction: May shower and wash wound with dial antibacterial soap and water prior to dressing change. Peri-Wound Care Skin Prep Discharge Instruction: Use skin prep as directed Topical Primary Dressing Secondary Dressing Woven Gauze Sponge, Non-Sterile 4x4 in Discharge Instruction: Apply over primary dressing as directed. Zetuvit Plus Silicone Border Dressing 4x4 (in/in) Discharge Instruction: Apply silicone border over primary dressing as directed. tubigrip size D Discharge Instruction: apply in the morning and remove at night. Secured With Compression Wrap Compression Stockings Facilities manager) Signed: 01/16/2023 2:10:51 PM By: Shawn Stall RN, BSN Entered By: Shawn Stall on 01/16/2023 10:25:59 -------------------------------------------------------------------------------- Wound Assessment Details Patient Name: Date of Service: [name], Dedra Skeens  THRYN [name] 01/16/2023 9:30 [name] M Medical Record Number: [id number] Patient Account Number: [id number] Date of Birth/Sex: Treating RN: 1975/10/09 (47 y.o. F) Primary Care Hansini Clodfelter: Felix Pacini Other Clinician: Referring Shelby Anderle: Treating Lezlee Gills/Extender: Genene Churn, Renee Weeks in Treatment: 0 Wound Status Wound Number: 2 Primary Etiology: Trauma, Other Wound Location: Right, Posterior Lower Leg Wound Status: Open Wounding Event: Bite Comorbid History: Sleep Apnea, Confinement Anxiety Date Acquired: 12/16/2022 Weeks Of Treatment: 0 Clustered Wound: No Photos [name], [name] ([id number]) [id number] Page 10 of 11 Wound Measurements Length: (cm) 0.5 Width: (cm) 5 Depth: (cm) 0.2 Area: (cm) 1.963 Volume: (cm) 0.393 % Reduction in Area: % Reduction in Volume: Epithelialization: None Tunneling: No Undermining: No Wound Description Classification: Full Thickness Without Exposed Suppor Wound Margin: Distinct, outline attached Exudate Amount: Medium Exudate  Type: Serosanguineous Exudate Color: red, brown t Structures Foul Odor After Cleansing: No Slough/Fibrino Yes Wound Bed Granulation Amount: Small (1-33%) Exposed Structure Granulation Quality: Red Fascia Exposed: No Necrotic Amount: Large (67-100%) Fat Layer (Subcutaneous Tissue) Exposed: Yes Necrotic Quality: Adherent Slough Tendon Exposed: No Muscle Exposed: No Joint Exposed: No Bone Exposed: No Periwound Skin Texture Texture Color No Abnormalities Noted: No No Abnormalities Noted: No Callus: No Atrophie Blanche: No Crepitus: No Cyanosis: No Excoriation: No Ecchymosis: No Induration: No Erythema: No Rash: No Hemosiderin Staining: No Scarring: No Mottled: No Pallor: No Moisture Rubor: No No Abnormalities Noted: No Dry / Scaly: No Maceration: No Treatment Notes Wound #2 (Lower Leg) Wound Laterality: Right, Posterior Cleanser Soap and Water Discharge Instruction: May shower and wash wound with dial antibacterial soap and water prior to dressing change. Peri-Wound Care Skin Prep Discharge Instruction: Use skin prep as directed Topical Primary Dressing Secondary Dressing Woven Gauze Sponge, Non-Sterile 4x4 in Discharge Instruction: Apply over primary dressing as directed. Zetuvit Plus Silicone Border Dressing 4x4 (in/in) Discharge Instruction: Apply silicone border over primary dressing as directed. tubigrip size D Discharge Instruction: apply in the morning and remove at night. [name], [name] ([id number]) [id number] Page 11 of 11 Secured With Compression Wrap Compression Stockings Facilities manager) Signed: 01/16/2023 2:10:51 PM By: Shawn Stall RN, BSN Entered By: Shawn Stall on 01/16/2023 10:26:14 -------------------------------------------------------------------------------- Vitals Details Patient Name: Date of Service: [name], [name] 01/16/2023 9:30 [name] M Medical Record Number: [id number] Patient Account  Number: [id number] Date of Birth/Sex: Treating RN: 07-03-1975 (47 y.o. F) Primary Care Geno Sydnor: Felix Pacini Other Clinician: Referring Matty Vanroekel: Treating Tressia Labrum/Extender: Genene Churn, Renee Weeks in Treatment: 0 Vital Signs Time Taken: 09:31 Temperature (F): 98.5 Height (in): 59 Pulse (bpm): 90 Source: Stated Respiratory Rate (breaths/min): 18 Weight (lbs): 218 Blood Pressure (mmHg): 128/87 Source: Stated Reference Range: 80 - 120 mg / dl Body Mass Index (BMI): 44 Electronic Signature(s) Signed: 01/16/2023 5:04:15 PM By: Thayer Dallas Entered By: Thayer Dallas on 01/16/2023 09:33:47

## 2023-01-16 NOTE — Progress Notes (Signed)
[name] ([id number]) [id number] Page 1 of 11 Visit Report for 01/16/2023 Chief Complaint Document Details Patient Name: Date of Service: [name] [facility] [name] 01/16/2023 9:30 [name] M Medical Record Number: [id number] Patient Account Number: [id number] Date of Birth/Sex: Treating RN: [birth year] (47 y.o. F) Primary Care Provider: Felix Pacini Other Clinician: Referring Provider: Treating Provider/Extender: Genene Churn, Renee Weeks in Treatment: 0 Information Obtained from: Patient Chief Complaint 01/16/2023; right lower extremity wound from [name] dog bite Electronic Signature(s) Signed: 01/16/2023 4:29:36 PM By: Geralyn Corwin DO Entered By: Geralyn Corwin on 01/16/2023 10:33:41 -------------------------------------------------------------------------------- Debridement Details Patient Name: Date of Service: [name] Blanks, [name] [name] 01/16/2023 9:30 [name] M Medical Record Number: [id number] Patient Account Number: [id number] Date of Birth/Sex: Treating RN: 12/01/1975 (47 y.o. Debara Pickett, Yvonne Kendall Primary Care Provider: Felix Pacini Other Clinician: Referring Provider: Treating Provider/Extender: Lajuana Carry Weeks in Treatment: 0 Debridement Performed for Assessment: Wound #1 Right,Lateral Lower Leg Performed By: Physician Geralyn Corwin, DO Debridement Type: Debridement Level of Consciousness (Pre-procedure): Awake and Alert Pre-procedure Verification/Time Out Yes - 10:20 Taken: Start Time: 10:21 Pain Control: Lidocaine 4% T opical Solution Percent of Wound Bed Debrided: 100% T Area Debrided (cm): otal 1.88 Tissue and other material debrided: Viable, Non-Viable, Slough, Subcutaneous, Skin: Dermis , Skin: Epidermis, Biofilm, Slough Level: Skin/Subcutaneous Tissue Debridement Description: Excisional Instrument: Curette Bleeding: Minimum Hemostasis Achieved: Pressure End Time: 10:26 Procedural Pain: 0 Post Procedural Pain:  0 Response to Treatment: Procedure was tolerated well Level of Consciousness (Post- Awake and Alert procedure): Post Debridement Measurements of Total Wound Length: (cm) 1.2 Width: (cm) 2 Depth: (cm) 0.3 Volume: (cm) 0.565 Character of Wound/Ulcer Post Debridement: Improved CARNESHA, KROSS [name] ([id number]) [id number] Page 2 of 11 Post Procedure Diagnosis Same as Pre-procedure Electronic Signature(s) Signed: 01/16/2023 2:10:51 PM By: Shawn Stall RN, BSN Signed: 01/16/2023 4:29:36 PM By: Geralyn Corwin DO Entered By: Shawn Stall on 01/16/2023 10:27:13 -------------------------------------------------------------------------------- Debridement Details Patient Name: Date of Service: [name] Blanks, [name] [name] 01/16/2023 9:30 [name] M Medical Record Number: [id number] Patient Account Number: [id number] Date of Birth/Sex: Treating RN: Apr 16, 1976 (47 y.o. Debara Pickett, Yvonne Kendall Primary Care Provider: Felix Pacini Other Clinician: Referring Provider: Treating Provider/Extender: Lajuana Carry Weeks in Treatment: 0 Debridement Performed for Assessment: Wound #2 Right,Posterior Lower Leg Performed By: Physician Geralyn Corwin, DO Debridement Type: Debridement Level of Consciousness (Pre-procedure): Awake and Alert Pre-procedure Verification/Time Out Yes - 10:20 Taken: Start Time: 10:21 Pain Control: Lidocaine 4% T opical Solution Percent of Wound Bed Debrided: 100% T Area Debrided (cm): otal 1.96 Tissue and other material debrided: Viable, Non-Viable, Slough, Subcutaneous, Skin: Dermis , Skin: Epidermis, Biofilm, Slough Level: Skin/Subcutaneous Tissue Debridement Description: Excisional Instrument: Curette Bleeding: Minimum Hemostasis Achieved: Pressure End Time: 10:26 Procedural Pain: 0 Post Procedural Pain: 0 Response to Treatment: Procedure was tolerated well Level of Consciousness (Post- Awake and Alert procedure): Post Debridement Measurements  of Total Wound Length: (cm) 0.5 Width: (cm) 5 Depth: (cm) 0.2 Volume: (cm) 0.393 Character of Wound/Ulcer Post Debridement: Improved Post Procedure Diagnosis Same as Pre-procedure Electronic Signature(s) Signed: 01/16/2023 2:10:51 PM By: Shawn Stall RN, BSN Signed: 01/16/2023 4:29:36 PM By: Geralyn Corwin DO Entered By: Shawn Stall on 01/16/2023 10:27:33 -------------------------------------------------------------------------------- HPI Details Patient Name: Date of Service: [name] Blanks, [name] [name] 01/16/2023 9:30 [name] M Medical Record Number: [id number] Patient Account Number: [id number] [name] ([id number]) [id number] Page 3 of 11 Date of Birth/Sex: Treating RN: Jan 29, 1976 (47 y.o. F) Primary Care Provider: Other Clinician: Felix Pacini Referring Provider:  Treating Provider/Extender: Genene Churn, Renee Weeks in Treatment: 0 History of Present Illness ssociated Signs and Symptoms: ABI done on 01/16/2023 was 0.9 [name] HPI Description: 01/16/2023 [name] is [name] with [name] past medical history of Hashimoto's thyroiditis that presents the clinic for [name] 1 month history of nonhealing wound to the right lower extremity. She was bit by [name] dog on 12/16/2022 and visited the ED for this issue. She had stitches placed and was started on oral antibiotics. The wound dehisced and remaining sutures were taken out by the ED. On 7/19 she again revisited the ED for wound check and was started on clindamycin. Patient has completed this course and currently denies signs of infection. She is also been following with her primary care physician for this issue. She has been using bag balm to the wound bed. Her Tdap vaccine is up-to-date and she had [name] rabies vaccine. Electronic Signature(s) Signed: 01/16/2023 4:29:36 PM By: Geralyn Corwin DO Entered By: Geralyn Corwin on 01/16/2023  10:40:44 -------------------------------------------------------------------------------- Physical Exam Details Patient Name: Date of Service: [name] [name] 01/16/2023 9:30 [name] M Medical Record Number: [id number] Patient Account Number: [id number] Date of Birth/Sex: Treating RN: [birth year] (47 y.o. F) Primary Care Provider: Felix Pacini Other Clinician: Referring Provider: Treating Provider/Extender: Genene Churn, Renee Weeks in Treatment: 0 Constitutional respirations regular, non-labored and within target range for patient.. Cardiovascular 2+ dorsalis pedis/posterior tibialis pulses. Psychiatric pleasant and cooperative. Notes T the right lower extremity posterior aspect there are 2 open wounds with nonviable tissue throughout the wound beds. No surrounding signs of infection o including increased warmth, erythema or purulent drainage. Electronic Signature(s) Signed: 01/16/2023 4:29:36 PM By: Geralyn Corwin DO Entered By: Geralyn Corwin on 01/16/2023 10:41:10 -------------------------------------------------------------------------------- Physician Orders Details Patient Name: Date of Service: [name] Blanks, [name] [name] 01/16/2023 9:30 [name] M Medical Record Number: [id number] Patient Account Number: [id number] Date of Birth/Sex: Treating RN: September 08, 1975 (47 y.o. 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 - Dr. Mikey Bussing 345pm Tuesday 01/24/2023 room 8 Return [name] ppointment in 2 weeks. -  Dr. Mikey Bussing room 8 Monday or Tuesday Return [name] Return appointment in 3 weeks. - Dr. Mikey Bussing room8 Monday or Tuesday Anesthetic (In clinic) Topical Lidocaine 4% applied to wound bed Bathing/ Shower/ Hygiene May shower and wash wound with soap and water. Edema Control - Lymphedema / SCD / Other Elevate legs to the level of the heart or above for 30 minutes daily and/or when sitting for 3-4 times [name] day throughout the day. Avoid standing for long periods of time. Other Edema Control Orders/Instructions: - tubrigrip size D apply in the morning and remove at night. Wound Treatment Wound #1 - Lower Leg Wound Laterality: Right, Lateral Cleanser: Soap and Water 1 x Per Day/15 Days Discharge Instructions: May shower and wash wound with dial antibacterial soap and water prior to dressing change. Peri-Wound Care: Skin Prep (DME) (Generic) 1 x Per Day/15 Days Discharge Instructions: Use skin prep as directed Prim Dressing: vashe wet to dry 1 x Per Day/15 Days ary Discharge Instructions: vashe moisten gauze. Secondary Dressing: Woven Gauze Sponge, Non-Sterile 4x4 in (  DME) (Generic) 1 x Per Day/15 Days Discharge Instructions: Apply over primary dressing as directed. Secondary Dressing: Zetuvit Plus Silicone Border Dressing 4x4 (in/in) (DME) (Generic) 1 x Per Day/15 Days Discharge Instructions: Apply silicone border over primary dressing as directed. Secondary Dressing: tubigrip size D 1 x Per Day/15 Days Discharge Instructions: apply in the morning and remove at night. Wound #2 - Lower Leg Wound Laterality: Right, Posterior Cleanser: Soap and Water 1 x Per Day/15 Days Discharge Instructions: May shower and wash wound with dial antibacterial soap and water prior to dressing change. Peri-Wound Care: Skin Prep (DME) (Generic) 1 x Per Day/15 Days Discharge Instructions: Use skin prep as directed Prim Dressing: vashe wet to dry 1 x Per Day/15 Days ary Discharge Instructions: vashe moisten  gauze. Secondary Dressing: Woven Gauze Sponge, Non-Sterile 4x4 in (DME) (Generic) 1 x Per Day/15 Days Discharge Instructions: Apply over primary dressing as directed. Secondary Dressing: Zetuvit Plus Silicone Border Dressing 4x4 (in/in) (DME) (Generic) 1 x Per Day/15 Days Discharge Instructions: Apply silicone border over primary dressing as directed. Secondary Dressing: tubigrip size D 1 x Per Day/15 Days Discharge Instructions: apply in the morning and remove at night. Patient Medications llergies: Ativan, metformin, penicillin [name] Notifications Medication Indication Start End applied prior to 01/16/2023 lidocaine debridements. DOSE topical 4 % cream - cream topical once daily Electronic Signature(s) Signed: 01/16/2023 2:10:51 PM By: Shawn Stall RN, BSN Signed: 01/16/2023 4:29:36 PM By: Soyla Murphy, Crislyn [name] ([id number]) By: Geralyn Corwin DO [id number] Page 5 of 11 Signed: 01/16/2023 4:29:36 PM Entered By: Shawn Stall on 01/16/2023 10:44:05 -------------------------------------------------------------------------------- Problem List Details Patient Name: Date of Service: [name] [facility] [name] 01/16/2023 9:30 [name] M Medical Record Number: [id number] Patient Account Number: [id number] Date of Birth/Sex: Treating RN: Jul 15, 1975 (47 y.o. F) Primary Care Provider: Felix Pacini Other Clinician: Referring Provider: Treating Provider/Extender: Genene Churn, Renee Weeks in Treatment: 0 Active Problems ICD-10 Encounter Code Description Active Date MDM Diagnosis L97.812 Non-pressure chronic ulcer of other part of right lower leg with fat layer 01/16/2023 No Yes exposed W54.0XXA Bitten by dog, initial encounter 01/16/2023 No Yes E06.3 Autoimmune thyroiditis 01/16/2023 No Yes Inactive Problems Resolved Problems Electronic Signature(s) Signed: 01/16/2023 4:29:36 PM By: Geralyn Corwin DO Entered By: Geralyn Corwin on 01/16/2023  10:32:21 -------------------------------------------------------------------------------- Progress Note Details Patient Name: Date of Service: [name] [name] 01/16/2023 9:30 [name] M Medical Record Number: [id number] Patient Account Number: [id number] Date of Birth/Sex: Treating RN: 1975-06-16 (47 y.o. F) Primary Care Provider: Felix Pacini Other Clinician: Referring Provider: Treating Provider/Extender: Genene Churn, Renee Weeks in Treatment: 0 Subjective Chief Complaint Information obtained from Patient 01/16/2023; right lower extremity wound from [name] dog bite History of Present Illness (HPI) The following HPI elements were documented for the patient's wound: Associated Signs and Symptoms: ABI done on 01/16/2023 was 0.9 01/16/2023 ARLEY, PORATH [name] ([id number]) [phone].pdf Page 6 of 11 Ms. Husna Brasile is [name] with [name] past medical history of Hashimoto's thyroiditis that presents the clinic for [name] 1 month history of nonhealing wound to the right lower extremity. She was bit by [name] dog on 12/16/2022 and visited the ED for this issue. She had stitches placed and was started on oral antibiotics. The wound dehisced and remaining sutures were taken out by the ED. On 7/19 she again revisited the ED for wound check and was started on clindamycin. Patient has completed this course and currently denies signs of infection. She is also been following with her primary care  physician for this issue. She has been using bag balm to the wound bed. Her Tdap vaccine is up-to-date and she had [name] rabies vaccine. Patient History Information obtained from Patient. Allergies Ativan, metformin, penicillin Family History Cancer - Siblings,Maternal Grandparents, Diabetes - Maternal Grandparents, Heart Disease - Paternal Grandparents, Hypertension - Mother, Stroke - Father, Thyroid Problems - Maternal Grandparents,Mother, No family history of Hereditary Spherocytosis,  Kidney Disease, Lung Disease, Seizures, Tuberculosis. Social History Never smoker, Marital Status - Divorced, Alcohol Use - Never, Drug Use - No History, Caffeine Use - Daily. Medical History Eyes Denies history of Cataracts, Glaucoma, Optic Neuritis Ear/Nose/Mouth/Throat Denies history of Chronic sinus problems/congestion, Middle ear problems Hematologic/Lymphatic Denies history of Anemia, Hemophilia, Human Immunodeficiency Virus, Lymphedema, Sickle Cell Disease Respiratory Patient has history of Sleep Apnea Denies history of Aspiration, Asthma, Chronic Obstructive Pulmonary Disease (COPD), Pneumothorax, Tuberculosis Cardiovascular Denies history of Angina, Arrhythmia, Congestive Heart Failure, Coronary Artery Disease, Deep Vein Thrombosis, Hypertension, Hypotension, Myocardial Infarction, Peripheral Arterial Disease, Peripheral Venous Disease, Phlebitis, Vasculitis Gastrointestinal Denies history of Cirrhosis , Colitis, Crohns, Hepatitis [name], Hepatitis B, Hepatitis C Genitourinary Denies history of End Stage Renal Disease Immunological Denies history of Lupus Erythematosus, Raynauds, Scleroderma Integumentary (Skin) Denies history of History of Burn Musculoskeletal Denies history of Gout, Rheumatoid Arthritis, Osteoarthritis, Osteomyelitis Neurologic Denies history of Dementia, Neuropathy, Quadriplegia, Paraplegia, Seizure Disorder Oncologic Denies history of Received Chemotherapy, Received Radiation Psychiatric Patient has history of Confinement Anxiety Denies history of Anorexia/bulimia Hospitalization/Surgery History - [facility] 2018. - C- section 2004 2007. - cervical decom/discectomy fusion. 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 Eyes Complains or has symptoms of Glasses / Contacts - contacts. Denies complaints or symptoms of Dry Eyes, Vision Changes. Ear/Nose/Mouth/Throat Denies complaints or symptoms of Chronic sinus problems or rhinitis. Respiratory Denies complaints or symptoms of Chronic or frequent coughs, Shortness of Breath. Cardiovascular Denies complaints or symptoms of Chest pain. Gastrointestinal Denies complaints or symptoms of Frequent diarrhea, Nausea, Vomiting. Endocrine Denies complaints or symptoms of Heat/cold intolerance. Genitourinary Denies complaints or symptoms of Frequent urination. Integumentary (Skin) Complains or has symptoms of Wounds - right hand and lower leg. Musculoskeletal Denies complaints or symptoms of Muscle Pain, Muscle Weakness. Neurologic Denies complaints or symptoms of Numbness/parasthesias. Psychiatric Complains or has symptoms of Claustrophobia. ARAMINTA, DERHAMMER [name] ([id number]) [id number] Page 7 of 11 Objective Constitutional respirations regular, non-labored and within target range for patient.. Vitals Time Taken: 9:31 AM, Height: 59 in, Source: Stated, Weight: 218 lbs, Source: Stated, BMI: 44, Temperature: 98.5 F, Pulse: 90 bpm, Respiratory Rate: 18 breaths/min, Blood Pressure: 128/87 mmHg. Cardiovascular 2+ dorsalis pedis/posterior tibialis pulses. Psychiatric pleasant and cooperative. General Notes: T the right lower extremity posterior aspect there are 2 open wounds with nonviable tissue throughout the wound beds. No surrounding signs of o infection including increased warmth, erythema or purulent drainage. Integumentary (Hair, Skin) Wound #1 status is Open. Original cause of wound was Bite. The date acquired was: 12/16/2022. The wound is located on the Right,Lateral Lower Leg. The wound measures 1.2cm length x 2cm width x 0.3cm depth;  1.885cm^2 area and 0.565cm^3 volume. There is Fat Layer (Subcutaneous Tissue) exposed. There is no tunneling or undermining noted. There is [name] medium amount of serosanguineous drainage noted. The wound margin is distinct with the outline attached to the  wound base. There is small (1-33%) red granulation within the wound bed. There is [name] large (67-100%) amount of necrotic tissue within the wound bed including Adherent Slough. The periwound skin appearance did not exhibit: Callus, Crepitus, Excoriation, Induration, Rash, Scarring, Dry/Scaly, Maceration, Atrophie Blanche, Cyanosis, Ecchymosis, Hemosiderin Staining, Mottled, Pallor, Rubor, Erythema. Wound #2 status is Open. Original cause of wound was Bite. The date acquired was: 12/16/2022. The wound is located on the Right,Posterior Lower Leg. The wound measures 0.5cm length x 5cm width x 0.2cm depth; 1.963cm^2 area and 0.393cm^3 volume. There is Fat Layer (Subcutaneous Tissue) exposed. There is no tunneling or undermining noted. There is [name] medium amount of serosanguineous drainage noted. The wound margin is distinct with the outline attached to the wound base. There is small (1-33%) red granulation within the wound bed. There is [name] large (67-100%) amount of necrotic tissue within the wound bed including Adherent Slough. The periwound skin appearance did not exhibit: Callus, Crepitus, Excoriation, Induration, Rash, Scarring, Dry/Scaly, Maceration, Atrophie Blanche, Cyanosis, Ecchymosis, Hemosiderin Staining, Mottled, Pallor, Rubor, Erythema. Assessment Active Problems ICD-10 Non-pressure chronic ulcer of other part of right lower leg with fat layer exposed Bitten by dog, initial encounter Autoimmune thyroiditis Patient presents with [name] 1 month history of nonhealing ulcer to the right lower extremity secondary to dog bite. I debrided nonviable tissue. No signs of infection. Her ABIs in office were 0.9 on the right and she has palpable pedal pulses  suggesting adequate blood flow for healing. At this time I recommended Vashe wet-to-dry dressings. She does have some mild swelling on exam and I recommended Tubigrip daily to address this. Elevate legs when sitting. She knows to call with any questions or concerns. Follow-up in 1 week. Procedures Wound #1 Pre-procedure diagnosis of Wound #1 is [name] Trauma, Other located on the Right,Lateral Lower Leg . There was [name] Excisional Skin/Subcutaneous Tissue Debridement with [name] total area of 1.88 sq cm performed by Geralyn Corwin, DO. With the following instrument(s): Curette to remove Viable and Non-Viable tissue/material. Material removed includes Subcutaneous Tissue, Slough, Skin: Dermis, Skin: Epidermis, and Biofilm after achieving pain control using Lidocaine 4% T opical Solution. [name] time out was conducted at 10:20, prior to the start of the procedure. [name] Minimum amount of bleeding was controlled with Pressure. The procedure was tolerated well with [name] pain level of 0 throughout and [name] pain level of 0 following the procedure. Post Debridement Measurements: 1.2cm length x 2cm width x 0.3cm depth; 0.565cm^3 volume. Character of Wound/Ulcer Post Debridement is improved. Post procedure Diagnosis Wound #1: Same as Pre-Procedure Wound #2 Pre-procedure diagnosis of Wound #2 is [name] Trauma, Other located on the Right,Posterior Lower Leg . There was [name] Excisional Skin/Subcutaneous Tissue Debridement with [name] total area of 1.96 sq cm performed by Geralyn Corwin, DO. With the following instrument(s): Curette to remove Viable and Non-Viable tissue/material. Material removed includes Subcutaneous Tissue, Slough, Skin: Dermis, Skin: Epidermis, and Biofilm after achieving pain control using Lidocaine 4% T opical Solution. [name] time out was conducted at 10:20, prior to the start of the procedure. [name] Minimum amount of bleeding was controlled with Pressure. The procedure was tolerated well with [name] pain level of 0 throughout and [name] pain  level of 0 following the procedure. Post Debridement Measurements: 0.5cm length x 5cm width x 0.2cm depth; 0.393cm^3 volume. Character of Wound/Ulcer Post Debridement is improved. Post procedure Diagnosis Wound #2: Same as Pre-Procedure SARYAH, AMES [name] ([id number]) [phone].pdf Page 8 of 11 Plan Follow-up Appointments: Return Appointment in 1  week. - Dr. Mikey Bussing 345pm Tuesday 01/24/2023 room 8 Return Appointment in 2 weeks. - Dr. Mikey Bussing room 8 Monday or Tuesday Return appointment in 3 weeks. - Dr. Mikey Bussing room8 Monday or Tuesday Anesthetic: (In clinic) Topical Lidocaine 4% applied to wound bed Bathing/ Shower/ Hygiene: May shower and wash wound with soap and water. Edema Control - Lymphedema / SCD / Other: Elevate legs to the level of the heart or above for 30 minutes daily and/or when sitting for 3-4 times [name] day throughout the day. Avoid standing for long periods of time. Other Edema Control Orders/Instructions: - tubrigrip size D apply in the morning and remove at night. The following medication(s) was prescribed: lidocaine topical 4 % cream cream topical once daily for applied prior to debridements. was prescribed at facility WOUND #1: - Lower Leg Wound Laterality: Right, Lateral Cleanser: Soap and Water 1 x Per Day/15 Days Discharge Instructions: May shower and wash wound with dial antibacterial soap and water prior to dressing change. Peri-Wound Care: Skin Prep (DME) (Generic) 1 x Per Day/15 Days Discharge Instructions: Use skin prep as directed Prim Dressing: vashe wet to dry 1 x Per Day/15 Days ary Discharge Instructions: vashe moisten gauze. Secondary Dressing: Woven Gauze Sponge, Non-Sterile 4x4 in (DME) (Generic) 1 x Per Day/15 Days Discharge Instructions: Apply over primary dressing as directed. Secondary Dressing: Zetuvit Plus Silicone Border Dressing 4x4 (in/in) (DME) (Generic) 1 x Per Day/15 Days Discharge Instructions: Apply silicone border  over primary dressing as directed. Secondary Dressing: tubigrip size D 1 x Per Day/15 Days Discharge Instructions: apply in the morning and remove at night. WOUND #2: - Lower Leg Wound Laterality: Right, Posterior Cleanser: Soap and Water 1 x Per Day/15 Days Discharge Instructions: May shower and wash wound with dial antibacterial soap and water prior to dressing change. Peri-Wound Care: Skin Prep (DME) (Generic) 1 x Per Day/15 Days Discharge Instructions: Use skin prep as directed Prim Dressing: vashe wet to dry 1 x Per Day/15 Days ary Discharge Instructions: vashe moisten gauze. Secondary Dressing: Woven Gauze Sponge, Non-Sterile 4x4 in (DME) (Generic) 1 x Per Day/15 Days Discharge Instructions: Apply over primary dressing as directed. Secondary Dressing: Zetuvit Plus Silicone Border Dressing 4x4 (in/in) (DME) (Generic) 1 x Per Day/15 Days Discharge Instructions: Apply silicone border over primary dressing as directed. Secondary Dressing: tubigrip size D 1 x Per Day/15 Days Discharge Instructions: apply in the morning and remove at night. 1. In office sharp debridement 2. Vashe wet-to-dry dressings 3. Tubigrip daily 4. Follow-up in 1 week Electronic Signature(s) Signed: 01/16/2023 2:10:51 PM By: Shawn Stall RN, BSN Signed: 01/16/2023 4:29:36 PM By: Geralyn Corwin DO Entered By: Shawn Stall on 01/16/2023 10:45:00 -------------------------------------------------------------------------------- HxROS Details Patient Name: Date of Service: [name] Blanks, [name] [name] 01/16/2023 9:30 [name] M Medical Record Number: [id number] Patient Account Number: [id number] Date of Birth/Sex: Treating RN: July 25, 1975 (47 y.o. Arta Silence Primary Care Provider: Felix Pacini Other Clinician: Referring Provider: Treating Provider/Extender: Lajuana Carry Weeks in Treatment: 0 Information Obtained From Patient Constitutional Symptoms (General Health) Complaints and Symptoms: Negative for:  Fatigue; Fever; Chills; Marked Weight Change Medical HistoryCAMBREY, DECATUR ([id number]) [id number] Page 9 of 11 Past Medical History Notes: Angular cheilitis chicken pox thyroid disease- hashimoto's thyroiditis Eyes Complaints and Symptoms: Positive for: Glasses / Contacts - contacts Negative for: Dry Eyes; Vision Changes Medical History: Negative for: Cataracts; Glaucoma; Optic Neuritis Ear/Nose/Mouth/Throat Complaints and Symptoms: Negative for: Chronic sinus problems or rhinitis Medical History: Negative for: Chronic sinus problems/congestion; Middle ear  problems Respiratory Complaints and Symptoms: Negative for: Chronic or frequent coughs; Shortness of Breath Medical History: Positive for: Sleep Apnea Negative for: Aspiration; Asthma; Chronic Obstructive Pulmonary Disease (COPD); Pneumothorax; Tuberculosis Cardiovascular Complaints and Symptoms: Negative for: Chest pain Medical History: Negative for: Angina; Arrhythmia; Congestive Heart Failure; Coronary Artery Disease; Deep Vein Thrombosis; Hypertension; Hypotension; Myocardial Infarction; Peripheral Arterial Disease; Peripheral Venous Disease; Phlebitis; Vasculitis Gastrointestinal Complaints and Symptoms: Negative for: Frequent diarrhea; Nausea; Vomiting Medical History: Negative for: Cirrhosis ; Colitis; Crohns; Hepatitis [name]; Hepatitis B; Hepatitis C Endocrine Complaints and Symptoms: Negative for: Heat/cold intolerance Genitourinary Complaints and Symptoms: Negative for: Frequent urination Medical History: Negative for: End Stage Renal Disease Integumentary (Skin) Complaints and Symptoms: Positive for: Wounds - right hand and lower leg Medical History: Negative for: History of Burn Musculoskeletal Complaints and Symptoms: Negative for: Muscle Pain; Muscle Weakness Medical History: Negative for: Gout; Rheumatoid Arthritis; Osteoarthritis; Osteomyelitis Past Medical History  Notes: arthritis in back Neurologic Complaints and Symptoms: Negative for: Numbness/parasthesias ESTELLE, MANTILLA [name] ([id number]) [id number] Page 10 of 11 Medical History: Negative for: Dementia; Neuropathy; Quadriplegia; Paraplegia; Seizure Disorder Psychiatric Complaints and Symptoms: Positive for: Claustrophobia Medical History: Positive for: Confinement Anxiety Negative for: Anorexia/bulimia Past Medical History Notes: ADD depression insomnia Hematologic/Lymphatic Medical History: Negative for: Anemia; Hemophilia; Human Immunodeficiency Virus; Lymphedema; Sickle Cell Disease Immunological Medical History: Negative for: Lupus Erythematosus; Raynauds; Scleroderma Oncologic Medical History: Negative for: Received Chemotherapy; Received Radiation Immunizations Pneumococcal Vaccine: Received Pneumococcal Vaccination: No Implantable Devices Yes Hospitalization / Surgery History Type of Hospitalization/Surgery DandC 2018 C- section 2004 2007 cervical decom/discectomy fusion Family and Social History Cancer: Yes - Siblings,Maternal Grandparents; Diabetes: Yes - Maternal Grandparents; Heart Disease: Yes - Paternal Grandparents; Hereditary Spherocytosis: No; Hypertension: Yes - Mother; Kidney Disease: No; Lung Disease: No; Seizures: No; Stroke: Yes - Father; Thyroid Problems: Yes - Maternal Grandparents,Mother; Tuberculosis: No; Never smoker; Marital Status - Divorced; Alcohol Use: Never; Drug Use: No History; Caffeine Use: Daily; Financial Concerns: No; Food, Clothing or Shelter Needs: No; Support System Lacking: No; Transportation Concerns: No Electronic Signature(s) Signed: 01/16/2023 10:05:23 AM By: Shawn Stall RN, BSN Signed: 01/16/2023 4:29:36 PM By: Geralyn Corwin DO Signed: 01/16/2023 5:04:15 PM By: Thayer Dallas Entered By: Thayer Dallas on 01/16/2023  09:40:02 -------------------------------------------------------------------------------- SuperBill Details Patient Name: Date of Service: [name] Blanks, [name] [name] 01/16/2023 Medical Record Number: [id number] Patient Account Number: [id number] Date of Birth/Sex: Treating RN: 06-16-1975 (47 y.o. 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 Facility Procedures : CPT4 Code: 13244010 Description: 99214 - WOUND CARE VISIT-LEV 4 EST PT Modifier: Quantity: 1 : CPT4 Code: 27253664 Description: 11042 - DEB SUBQ TISSUE 20 SQ CM/< ICD-10 Diagnosis Description L97.812 Non-pressure chronic ulcer of other part of right lower leg with fat layer expos W54.0XXA Bitten by dog, initial encounter Modifier: ed Quantity: 1 Physician Procedures : CPT4 Code Description Modifier [id number] 59563 - WC PHYS LEVEL 4 - NEW PT 25 ICD-10 Diagnosis Description L97.812 Non-pressure chronic ulcer of other part of right lower leg with fat layer exposed W54.0XXA Bitten by dog, initial encounter 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

## 2023-01-16 NOTE — Progress Notes (Signed)
[name] ([id number]) [phone].pdf Page 1 of 4 Visit Report for 01/16/2023 Abuse Risk Screen Details Patient Name: Date of Service: [name] [facility] [name] 01/16/2023 9:30 [name] M Medical Record Number: [id number] Patient Account Number: [id number] Date of Birth/Sex: Treating RN: [name] (47 y.o. F) Primary Care [name]: [name] Other Clinician: Referring [name]: Treating [name]/Extender: [name], [name] in Treatment: 0 Abuse Risk Screen Items Answer ABUSE RISK SCREEN: Has anyone close to you tried to hurt or harm you recentlyo No Do you feel uncomfortable with anyone in your familyo No Has anyone forced you do things that you didnt want to doo No Electronic Signature(s) Signed: 01/16/2023 5:04:15 PM By: [name] Entered By: [name] on 01/16/2023 09:40:14 -------------------------------------------------------------------------------- Activities of Daily Living Details Patient Name: Date of Service: [name] [facility] [name] 01/16/2023 9:30 [name] M Medical Record Number: [id number] Patient Account Number: [id number] Date of Birth/Sex: Treating RN: [birth year] [name] (47 y.o. F) Primary Care [name]: [name] Other Clinician: Referring [name]: Treating [name]/Extender: [name], [name] in Treatment: 0 Activities of Daily Living Items Answer Activities of Daily Living (Please select one for each item) Drive Automobile Completely Able T Medications ake Completely Able Use T elephone Completely Able Care for Appearance Completely Able Use T oilet Completely Able Bath / Shower Completely Able Dress Self Completely Able Feed Self Completely Able Walk Completely Able Get In / Out Bed Completely Able Housework Completely Able Prepare Meals Completely Able Handle Money Completely Able Shop for Self Completely Able Electronic Signature(s) Signed: 01/16/2023 5:04:15 PM By: [name] Entered  By: [name] on 01/16/2023 09:40:45 [name] ([id number]) [id number] Page 2 of 4 -------------------------------------------------------------------------------- Education Screening Details Patient Name: Date of Service: [name] [facility] [name] 01/16/2023 9:30 [name] M Medical Record Number: [id number] Patient Account Number: [id number] Date of Birth/Sex: Treating RN: [birth year][name] (47 y.o. F) Primary Care Zylen Wenig: [name] Other Clinician: Referring Alaya Iverson: Treating Deandrew Hoecker/Extender: [name] in Treatment: 0 Learning Preferences/Education Level/Primary Language Learning Preference: Explanation, Demonstration, Printed Material Highest Education Level: College or Above Preferred Language: English Cognitive Barrier Language Barrier: No Translator Needed: No Memory Deficit: No Emotional Barrier: No Cultural/Religious Beliefs Affecting Medical Care: No Physical Barrier Impaired Vision: Yes Contacts Impaired Hearing: No Decreased Hand dexterity: No Knowledge/Comprehension Knowledge Level: High Comprehension Level: High Ability to understand written instructions: High Ability to understand verbal instructions: High Motivation Anxiety Level: Calm Cooperation: Cooperative Education Importance: Acknowledges Need Interest in Health Problems: Asks Questions Perception: Coherent Willingness to Engage in Self-Management High Activities: Readiness to Engage in Self-Management High Activities: Electronic Signature(s) Signed: 01/16/2023 5:04:15 PM By: [name] Entered By: [name] on 01/16/2023 09:41:18 -------------------------------------------------------------------------------- Fall Risk Assessment Details Patient Name: Date of Service: [name] [name] 01/16/2023 9:30 [name] M Medical Record Number: [id number] Patient Account Number: [id number] Date of Birth/Sex: Treating RN: [birth year] [name] (47 y.o.  F) Primary Care [name]: [name] Other Clinician: Referring [name]: Treating [name]/Extender: [name], [name] in Treatment: 0 Fall Risk Assessment Items Have you had 2 or more falls in the last 12 monthso 0 No Have you had any fall that resulted in injury in the last 12 monthso 0 No [name] ([id number]) [id number] Page 3 of 4 FALLS RISK SCREEN History of falling - immediate or within 3 months 0 No Secondary diagnosis (Do you have 2 or more medical diagnoseso) 0 No Ambulatory aid None/bed rest/wheelchair/nurse 0 No Crutches/cane/walker 0 No Furniture 0  No Intravenous therapy Access/Saline/Heparin Lock 0 No Gait/Transferring Normal/ bed rest/ wheelchair 0 Yes Weak (short steps with or without shuffle, stooped but able to lift head while walking, may seek 0 No support from furniture) Impaired (short steps with shuffle, may have difficulty arising from chair, head down, impaired 0 No balance) Mental Status Oriented to own ability 0 Yes Electronic Signature(s) Signed: 01/16/2023 5:04:15 PM By: [name] Entered By: [name] on 01/16/2023 09:41:46 -------------------------------------------------------------------------------- Foot Assessment Details Patient Name: Date of Service: [name] [name] 01/16/2023 9:30 [name] M Medical Record Number: [id number] Patient Account Number: [id number] Date of Birth/Sex: Treating RN: [birth year][name] (47 y.o. F) Primary Care [name]: [name] Other Clinician: Referring [name]: Treating [name]/Extender: [name], [name] in Treatment: 0 Foot Assessment Items Site Locations + = Sensation present, - = Sensation absent, C = Callus, U = Ulcer R = Redness, W = Warmth, M = Maceration, PU = Pre-ulcerative lesion F = Fissure, S = Swelling, D = Dryness Assessment Right: Left: Other Deformity: No No Prior Foot Ulcer: No No Prior Amputation: No  No Charcot Joint: No No Ambulatory Status: Ambulatory Without Help GaitFATIN, CARNEY [name] ([id number]) [id number] Page 4 of 4 Electronic Signature(s) Signed: 01/16/2023 5:04:15 PM By: [name] Entered By: [name] on 01/16/2023 09:51:04 -------------------------------------------------------------------------------- Nutrition Risk Screening Details Patient Name: Date of Service: [name] [facility] [name] 01/16/2023 9:30 [name] M Medical Record Number: [id number] Patient Account Number: [id number] Date of Birth/Sex: Treating RN: [birth year] [name] (47 y.o. F) Primary Care Charlye Spare: [name] Other Clinician: Referring Leilanee Righetti: Treating Lavella Myren/Extender: [name], [name] in Treatment: 0 Height (in): 59 Weight (lbs): 218 Body Mass Index (BMI): 44 Nutrition Risk Screening Items Score Screening NUTRITION RISK SCREEN: I have an illness or condition that made me change the kind and/or amount of food I eat 0 No I eat fewer than two meals per day 0 No I eat few fruits and vegetables, or milk products 0 No I have three or more drinks of beer, liquor or wine almost every day 0 No I have tooth or mouth problems that make it hard for me to eat 0 No I don't always have enough money to buy the food I need 0 No I eat alone most of the time 0 No I take three or more different prescribed or over-the-counter drugs [name] day 1 Yes Without wanting to, I have lost or gained 10 pounds in the last six months 0 No I am not always physically able to shop, cook and/or feed myself 0 No Nutrition Protocols Good Risk Protocol 0 No interventions needed Moderate Risk Protocol High Risk Proctocol Risk Level: Good Risk Score: 1 Electronic Signature(s) Signed: 01/16/2023 5:04:15 PM By: [name] Entered By: [name] on 01/16/2023 09:42:25

## 2023-01-24 ENCOUNTER — Encounter (HOSPITAL_BASED_OUTPATIENT_CLINIC_OR_DEPARTMENT_OTHER): Payer: 59 | Admitting: Internal Medicine

## 2023-01-24 DIAGNOSIS — W540XXA Bitten by dog, initial encounter: Secondary | ICD-10-CM | POA: Diagnosis not present

## 2023-01-24 DIAGNOSIS — L97812 Non-pressure chronic ulcer of other part of right lower leg with fat layer exposed: Secondary | ICD-10-CM

## 2023-01-24 NOTE — Progress Notes (Signed)
[name], PILLADO [name] ([id number]) [id number] Page 1 of 10 Visit Report for 01/24/2023 Chief Complaint Document [name] Patient Name: Date of Service: [name] [facility] [name] 01/24/2023 3:45 PM Medical Record Number: [id number] Patient Account Number: [id number] Date of Birth/Sex: Treating RN: 06/22/1975 (47 y.o. F) Primary Care Provider: Felix Pacini Other Clinician: Referring Provider: Treating Provider/Extender: Genene Churn, Renee Weeks in Treatment: 1 Information Obtained from: Patient Chief Complaint 01/16/2023; right lower extremity wound from [name] dog bite Electronic Signature(s) Signed: 01/24/2023 4:37:08 PM By: Geralyn Corwin DO Entered By: Geralyn Corwin on 01/24/2023 16:28:18 -------------------------------------------------------------------------------- [name] Patient Name: Date of Service: [name] Blanks, [name] [name] 01/24/2023 3:45 PM Medical Record Number: [id number] Patient Account Number: [id number] Date of Birth/Sex: Treating RN: Jun 26, 1975 (47 y.o. Fredderick Phenix Primary Care Provider: Felix Pacini Other Clinician: Referring Provider: Treating Provider/Extender: Lajuana Carry Weeks in Treatment: 1 [name] Performed for Assessment: Wound #1 Right,Lateral Lower Leg Performed By: Physician Geralyn Corwin, DO [name] Type: [name] Level of Consciousness (Pre-procedure): Awake and Alert Pre-procedure Verification/Time Out Yes - 16:18 Taken: Start Time: 16:18 Pain Control: Lidocaine 4% T opical Solution Percent of Wound Bed Debrided: 100% T Area Debrided (cm): otal 0.71 Tissue and other material debrided: Non-Viable, Slough, Slough Level: Non-Viable Tissue [name] Description: Selective/Open Wound Instrument: Curette Bleeding: Minimum Hemostasis Achieved: Pressure Response to Treatment: Procedure was tolerated well Level of Consciousness (Post- Awake and Alert procedure): Post  [name] Measurements of Total Wound Length: (cm) 0.6 Width: (cm) 1.5 Depth: (cm) 0.3 Volume: (cm) 0.212 Character of Wound/Ulcer Post [name]: Improved Post Procedure Diagnosis Same as Pre-procedure MARIANI, SEERING [name] ([id number]) [id number] Page 2 of 10 Notes scribed for Dr. Mikey Bussing by Samuella Bruin, RN Electronic Signature(s) Signed: 01/24/2023 4:28:55 PM By: Samuella Bruin Signed: 01/24/2023 4:37:08 PM By: Geralyn Corwin DO Entered By: Samuella Bruin on 01/24/2023 16:20:06 -------------------------------------------------------------------------------- [name] Patient Name: Date of Service: [name] Blanks, [name] [name] 01/24/2023 3:45 PM Medical Record Number: [id number] Patient Account Number: [id number] Date of Birth/Sex: Treating RN: 10-Sep-1975 (47 y.o. Fredderick Phenix Primary Care Provider: Felix Pacini Other Clinician: Referring Provider: Treating Provider/Extender: Lajuana Carry Weeks in Treatment: 1 [name] Performed for Assessment: Wound #2 Right,Posterior Lower Leg Performed By: Physician Geralyn Corwin, DO [name] Type: [name] Level of Consciousness (Pre-procedure): Awake and Alert Pre-procedure Verification/Time Out Yes - 16:18 Taken: Start Time: 16:18 Pain Control: Lidocaine 4% T opical Solution Percent of Wound Bed Debrided: 100% T Area Debrided (cm): otal 1.57 Tissue and other material debrided: Non-Viable, Slough, Slough Level: Non-Viable Tissue [name] Description: Selective/Open Wound Instrument: Curette Bleeding: Minimum Hemostasis Achieved: Pressure Response to Treatment: Procedure was tolerated well Level of Consciousness (Post- Awake and Alert procedure): Post [name] Measurements of Total Wound Length: (cm) 0.5 Width: (cm) 4 Depth: (cm) 0.2 Volume: (cm) 0.314 Character of Wound/Ulcer Post [name]: Improved Post Procedure Diagnosis Same as  Pre-procedure Notes scribed for Dr. Mikey Bussing by Samuella Bruin, RN Electronic Signature(s) Signed: 01/24/2023 4:28:55 PM By: Samuella Bruin Signed: 01/24/2023 4:37:08 PM By: Geralyn Corwin DO Entered By: Samuella Bruin on 01/24/2023 16:20:22 -------------------------------------------------------------------------------- HPI [name] Patient Name: Date of Service: [name] Blanks, [name] [name] 01/24/2023 3:45 PM Medical Record Number: [id number] Patient Account Number: [id number] [name], SALSER [name] ([id number]) [phone].pdf Page 3 of 10 Date of Birth/Sex: Treating RN: 02-20-1976 (47 y.o. F) Primary Care Provider: Other Clinician: Felix Pacini Referring Provider: Treating Provider/Extender: Genene Churn, Renee Weeks in Treatment: 1 History of Present Illness ssociated Signs and Symptoms: ABI done on 01/16/2023 was 0.9  [name] HPI Description: 01/16/2023 [name] is [name] with [name] past medical history of Hashimoto's thyroiditis that presents the clinic for [name] 1 month history of nonhealing wound to the right lower extremity. She was bit by [name] dog on 12/16/2022 and visited the ED for this issue. She had stitches placed and was started on oral antibiotics. The wound dehisced and remaining sutures were taken out by the ED. On 7/19 she again revisited the ED for wound check and was started on clindamycin. Patient has completed this course and currently denies signs of infection. She is also been following with her primary care physician for this issue. She has been using bag balm to the wound bed. Her Tdap vaccine is up-to-date and she had [name] rabies vaccine. 8/13; patient presents for follow-up. She has been using Vashe wet-to-dry dressings to the wound beds. The wounds are smaller. She has no issues or complaints today. Electronic Signature(s) Signed: 01/24/2023 4:37:08 PM By: Geralyn Corwin DO Entered By: Geralyn Corwin on 01/24/2023  16:29:06 -------------------------------------------------------------------------------- Physical Exam [name] Patient Name: Date of Service: [name] [name] 01/24/2023 3:45 PM Medical Record Number: [id number] Patient Account Number: [id number] Date of Birth/Sex: Treating RN: [birth year] (47 y.o. F) Primary Care Provider: Felix Pacini Other Clinician: Referring Provider: Treating Provider/Extender: Genene Churn, Renee Weeks in Treatment: 1 Constitutional respirations regular, non-labored and within target range for patient.. Cardiovascular 2+ dorsalis pedis/posterior tibialis pulses. Psychiatric pleasant and cooperative. Notes T the right lower extremity posterior aspect there are 2 open wounds with nonviable tissue and granulation tissue to the wound beds. No surrounding signs of o infection including increased warmth, erythema or purulent drainage. Electronic Signature(s) Signed: 01/24/2023 4:37:08 PM By: Geralyn Corwin DO Entered By: Geralyn Corwin on 01/24/2023 16:29:53 -------------------------------------------------------------------------------- Physician Orders [name] Patient Name: Date of Service: [name] Blanks, [name] [name] 01/24/2023 3:45 PM Medical Record Number: [id number] Patient Account Number: [id number] Date of Birth/Sex: Treating RN: Nov 06, 1975 (47 y.o. Fredderick Phenix Primary Care Provider: Felix Pacini Other Clinician: Referring Provider: Treating Provider/Extender: Delfin Edis in Treatment: 1 Verbal / Phone Orders: No TARIA, DESANCTIS [name] ([id number]) [id number] Page 4 of 10 Diagnosis Coding Follow-up Appointments ppointment in 1 week. - Dr. Mikey Bussing room 8 Monday or Tuesday Return [name] ppointment in 2 weeks. - Dr. Mikey Bussing room 8 Monday or Tuesday Return [name] Return appointment in 3 weeks. - Dr. Mikey Bussing room8 Monday or Tuesday Anesthetic (In clinic) Topical Lidocaine 4% applied to wound  bed Bathing/ Shower/ Hygiene May shower and wash wound with soap and water. Edema Control - Lymphedema / SCD / Other Elevate legs to the level of the heart or above for 30 minutes daily and/or when sitting for 3-4 times [name] day throughout the day. Avoid standing for long periods of time. Other Edema Control Orders/Instructions: - tubrigrip size D apply in the morning and remove at night. Wound Treatment Wound #1 - Lower Leg Wound Laterality: Right, Lateral Cleanser: Soap and Water 1 x Per Day/15 Days Discharge Instructions: May shower and wash wound with dial antibacterial soap and water prior to dressing change. Cleanser: Vashe 5.8 (oz) 1 x Per Day/15 Days Discharge Instructions: Cleanse the wound with Vashe prior to applying [name] clean dressing using gauze sponges, not tissue or cotton balls. Prim Dressing: Hydrofera Blue Ready Transfer Foam, 2.5x2.5 (in/in) 1 x Per Day/15 Days ary Discharge Instructions: Apply directly to wound bed as directed Prim Dressing: MediHoney Gel, tube 1.5 (oz) 1 x Per Day/15 Days ary  Discharge Instructions: Apply to wound bed as instructed Secondary Dressing: Zetuvit Plus Silicone Border Dressing 4x4 (in/in) (Generic) 1 x Per Day/15 Days Discharge Instructions: Apply silicone border over primary dressing as directed. Secondary Dressing: tubigrip size D 1 x Per Day/15 Days Discharge Instructions: apply in the morning and remove at night. Wound #2 - Lower Leg Wound Laterality: Right, Posterior Cleanser: Soap and Water 1 x Per Day/15 Days Discharge Instructions: May shower and wash wound with dial antibacterial soap and water prior to dressing change. Peri-Wound Care: Skin Prep (Generic) 1 x Per Day/15 Days Discharge Instructions: Use skin prep as directed Prim Dressing: Hydrofera Blue Ready Transfer Foam, 2.5x2.5 (in/in) 1 x Per Day/15 Days ary Discharge Instructions: Apply directly to wound bed as directed Prim Dressing: MediHoney Gel, tube 1.5 (oz) 1 x Per  Day/15 Days ary Discharge Instructions: Apply to wound bed as instructed Secondary Dressing: Zetuvit Plus Silicone Border Dressing 4x4 (in/in) (Generic) 1 x Per Day/15 Days Discharge Instructions: Apply silicone border over primary dressing as directed. Secondary Dressing: tubigrip size D 1 x Per Day/15 Days Discharge Instructions: apply in the morning and remove at night. Patient Medications llergies: Ativan, metformin, penicillin [name] Notifications Medication Indication Start End 01/24/2023 lidocaine DOSE topical 4 % cream - cream topical Electronic Signature(s) Signed: 01/24/2023 4:37:08 PM By: Geralyn Corwin DO Previous Signature: 01/24/2023 4:28:55 PM Version By: Samuella Bruin Entered By: Geralyn Corwin on 01/24/2023 16:30:05 [name] ([id number]) [id number] Page 5 of 10 -------------------------------------------------------------------------------- Problem List [name] Patient Name: Date of Service: [name] [facility] [name] 01/24/2023 3:45 PM Medical Record Number: [id number] Patient Account Number: [id number] Date of Birth/Sex: Treating RN: 05/19/1976 (47 y.o. F) Primary Care Provider: Felix Pacini Other Clinician: Referring Provider: Treating Provider/Extender: Genene Churn, Renee Weeks in Treatment: 1 Active Problems ICD-10 Encounter Code Description Active Date MDM Diagnosis L97.812 Non-pressure chronic ulcer of other part of right lower leg with fat layer 01/16/2023 No Yes exposed W54.0XXA Bitten by dog, initial encounter 01/16/2023 No Yes E06.3 Autoimmune thyroiditis 01/16/2023 No Yes Inactive Problems Resolved Problems Electronic Signature(s) Signed: 01/24/2023 4:37:08 PM By: Geralyn Corwin DO Entered By: Geralyn Corwin on 01/24/2023 16:28:06 -------------------------------------------------------------------------------- Progress Note [name] Patient Name: Date of Service: [name] Blanks, [name] [name] 01/24/2023 3:45  PM Medical Record Number: [id number] Patient Account Number: [id number] Date of Birth/Sex: Treating RN: 01/27/1976 (47 y.o. F) Primary Care Provider: Felix Pacini Other Clinician: Referring Provider: Treating Provider/Extender: Genene Churn, Renee Weeks in Treatment: 1 Subjective Chief Complaint Information obtained from Patient 01/16/2023; right lower extremity wound from [name] dog bite History of Present Illness (HPI) The following HPI elements were documented for the patient's wound: Associated Signs and Symptoms: ABI done on 01/16/2023 was 0.9 01/16/2023 Ms. Gean Stefanowicz is [name] with [name] past medical history of Hashimoto's thyroiditis that presents the clinic for [name] 1 month history of nonhealing wound to the right lower extremity. She was bit by [name] dog on 12/16/2022 and visited the ED for this issue. She had stitches placed and was started on oral antibiotics. The wound dehisced and remaining sutures were taken out by the ED. On 7/19 she again revisited the ED for wound check and was started on clindamycin. Patient has completed this course and currently denies signs of infection. She is also been following with her primary care physician for this issue. She has been using bag balm to the wound bed. Her Tdap vaccine is up-to-date and she had [name] rabies vaccine. LIGAYA, BALAS [name] ([id number]) [id number]  Page 6 of 10 8/13; patient presents for follow-up. She has been using Vashe wet-to-dry dressings to the wound beds. The wounds are smaller. She has no issues or complaints today. Patient History Information obtained from Patient. Family History Cancer - Siblings,Maternal Grandparents, Diabetes - Maternal Grandparents, Heart Disease - Paternal Grandparents, Hypertension - Mother, Stroke - Father, Thyroid Problems - Maternal Grandparents,Mother, No family history of Hereditary Spherocytosis, Kidney Disease, Lung Disease, Seizures,  Tuberculosis. Social History Never smoker, Marital Status - Divorced, Alcohol Use - Never, Drug Use - No History, Caffeine Use - Daily. Medical History Eyes Denies history of Cataracts, Glaucoma, Optic Neuritis Ear/Nose/Mouth/Throat Denies history of Chronic sinus problems/congestion, Middle ear problems Hematologic/Lymphatic Denies history of Anemia, Hemophilia, Human Immunodeficiency Virus, Lymphedema, Sickle Cell Disease Respiratory Patient has history of Sleep Apnea Denies history of Aspiration, Asthma, Chronic Obstructive Pulmonary Disease (COPD), Pneumothorax, Tuberculosis Cardiovascular Denies history of Angina, Arrhythmia, Congestive Heart Failure, Coronary Artery Disease, Deep Vein Thrombosis, Hypertension, Hypotension, Myocardial Infarction, Peripheral Arterial Disease, Peripheral Venous Disease, Phlebitis, Vasculitis Gastrointestinal Denies history of Cirrhosis , Colitis, Crohns, Hepatitis [name], Hepatitis B, Hepatitis C Genitourinary Denies history of End Stage Renal Disease Immunological Denies history of Lupus Erythematosus, Raynauds, Scleroderma Integumentary (Skin) Denies history of History of Burn Musculoskeletal Denies history of Gout, Rheumatoid Arthritis, Osteoarthritis, Osteomyelitis Neurologic Denies history of Dementia, Neuropathy, Quadriplegia, Paraplegia, Seizure Disorder Oncologic Denies history of Received Chemotherapy, Received Radiation Psychiatric Patient has history of Confinement Anxiety Denies history of Anorexia/bulimia Hospitalization/Surgery History - [facility] 2018. - C- section 2004 2007. - cervical decom/discectomy fusion. 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 Vitals  Time Taken: 3:50 PM, Height: 59 in, Weight: 218 lbs, BMI: 44, Temperature: 98.4 F, Pulse: 74 bpm, Respiratory Rate: 18 breaths/min, Blood Pressure: 132/86 mmHg. Cardiovascular 2+ dorsalis pedis/posterior tibialis pulses. Psychiatric pleasant and cooperative. General Notes: T the right lower extremity posterior aspect there are 2 open wounds with nonviable tissue and granulation tissue to the wound beds. No o surrounding signs of infection including increased warmth, erythema or purulent drainage. Integumentary (Hair, Skin) Wound #1 status is Open. Original cause of wound was Bite. The date acquired was: 12/16/2022. The wound has been in treatment 1 weeks. The wound is located on the Right,Lateral Lower Leg. The wound measures 0.6cm length x 1.5cm width x 0.3cm depth; 0.707cm^2 area and 0.212cm^3 volume. There is Fat Layer (Subcutaneous Tissue) exposed. There is no tunneling or undermining noted. There is [name] medium amount of serosanguineous drainage noted. The wound margin is distinct with the outline attached to the wound base. There is medium (34-66%) red granulation within the wound bed. There is [name] medium (34-66%) amount of ISOBELLA, SALMI [name] ([id number]) [phone].pdf Page 7 of 10 necrotic tissue within the wound bed including Adherent Slough. The periwound skin appearance did not exhibit: Callus, Crepitus, Excoriation, Induration, Rash, Scarring, Dry/Scaly, Maceration, Atrophie Blanche, Cyanosis, Ecchymosis, Hemosiderin Staining, Mottled, Pallor, Rubor, Erythema. Wound #2 status is Open. Original cause of wound was Bite. The date acquired was: 12/16/2022. The wound has been in treatment 1 weeks. The wound is located on the Right,Posterior Lower Leg. The wound measures 0.5cm length x 4cm width x 0.2cm depth; 1.571cm^2 area and 0.314cm^3 volume. There is Fat Layer (Subcutaneous Tissue) exposed. There is no tunneling or undermining noted. There is [name] medium amount of  serosanguineous drainage noted. The  wound margin is distinct with the outline attached to the wound base. There is medium (34-66%) red granulation within the wound bed. There is [name] medium (34-66%) amount of necrotic tissue within the wound bed including Adherent Slough. The periwound skin appearance did not exhibit: Callus, Crepitus, Excoriation, Induration, Rash, Scarring, Dry/Scaly, Maceration, Atrophie Blanche, Cyanosis, Ecchymosis, Hemosiderin Staining, Mottled, Pallor, Rubor, Erythema. Assessment Active Problems ICD-10 Non-pressure chronic ulcer of other part of right lower leg with fat layer exposed Bitten by dog, initial encounter Autoimmune thyroiditis Patient's wound has shown improvement in size in appearance since last clinic visit. I debrided nonviable tissue. At this time I recommended she use Vashe to clean the wound bed but start using Medihoney and Hydrofera Blue for wound dressings. Continue Tubigrip daily. Procedures Wound #1 Pre-procedure diagnosis of Wound #1 is [name] Trauma, Other located on the Right,Lateral Lower Leg . There was [name] Selective/Open Wound Non-Viable Tissue [name] with [name] total area of 0.71 sq cm performed by Geralyn Corwin, DO. With the following instrument(s): Curette to remove Non-Viable tissue/material. Material removed includes [facility] after achieving pain control using Lidocaine 4% Topical Solution. No specimens were taken. [name] time out was conducted at 16:18, prior to the start of the procedure. [name] Minimum amount of bleeding was controlled with Pressure. The procedure was tolerated well. Post [name] Measurements: 0.6cm length x 1.5cm width x 0.3cm depth; 0.212cm^3 volume. Character of Wound/Ulcer Post [name] is improved. Post procedure Diagnosis Wound #1: Same as Pre-Procedure General Notes: scribed for Dr. Mikey Bussing by Samuella Bruin, RN. Wound #2 Pre-procedure diagnosis of Wound #2 is [name] Trauma, Other located on the Right,Posterior Lower Leg .  There was [name] Selective/Open Wound Non-Viable Tissue [name] with [name] total area of 1.57 sq cm performed by Geralyn Corwin, DO. With the following instrument(s): Curette to remove Non-Viable tissue/material. Material removed includes [facility] after achieving pain control using Lidocaine 4% Topical Solution. No specimens were taken. [name] time out was conducted at 16:18, prior to the start of the procedure. [name] Minimum amount of bleeding was controlled with Pressure. The procedure was tolerated well. Post [name] Measurements: 0.5cm length x 4cm width x 0.2cm depth; 0.314cm^3 volume. Character of Wound/Ulcer Post [name] is improved. Post procedure Diagnosis Wound #2: Same as Pre-Procedure General Notes: scribed for Dr. Mikey Bussing by Samuella Bruin, RN. Plan Follow-up Appointments: Return Appointment in 1 week. - Dr. Mikey Bussing room 8 Monday or Tuesday Return Appointment in 2 weeks. - Dr. Mikey Bussing room 8 Monday or Tuesday Return appointment in 3 weeks. - Dr. Mikey Bussing room8 Monday or Tuesday Anesthetic: (In clinic) Topical Lidocaine 4% applied to wound bed Bathing/ Shower/ Hygiene: May shower and wash wound with soap and water. Edema Control - Lymphedema / SCD / Other: Elevate legs to the level of the heart or above for 30 minutes daily and/or when sitting for 3-4 times [name] day throughout the day. Avoid standing for long periods of time. Other Edema Control Orders/Instructions: - tubrigrip size D apply in the morning and remove at night. The following medication(s) was prescribed: lidocaine topical 4 % cream cream topical was prescribed at facility WOUND #1: - Lower Leg Wound Laterality: Right, Lateral Cleanser: Soap and Water 1 x Per Day/15 Days Discharge Instructions: May shower and wash wound with dial antibacterial soap and water prior to dressing change. Cleanser: Vashe 5.8 (oz) 1 x Per Day/15 Days Discharge Instructions: Cleanse the wound with Vashe prior to applying [name] clean dressing using  gauze sponges, not tissue or cotton balls. Prim Dressing: Hydrofera Blue  Ready Transfer Foam, 2.5x2.5 (in/in) 1 x Per Day/15 Days ary Discharge Instructions: Apply directly to wound bed as directed Prim Dressing: MediHoney Gel, tube 1.5 (oz) 1 x Per Day/15 Days ary Discharge Instructions: Apply to wound bed as instructed Secondary Dressing: Zetuvit Plus Silicone Border Dressing 4x4 (in/in) (Generic) 1 x Per Day/15 Days Discharge Instructions: Apply silicone border over primary dressing as directed. Secondary Dressing: tubigrip size D 1 x Per Day/15 Days Discharge Instructions: apply in the morning and remove at night. WOUND #2: - Lower Leg Wound Laterality: Right, Posterior Cleanser: Soap and Water 1 x Per Day/15 Days ALANAH, DESIR [name] ([id number]) [id number] Page 8 of 10 Discharge Instructions: May shower and wash wound with dial antibacterial soap and water prior to dressing change. Peri-Wound Care: Skin Prep (Generic) 1 x Per Day/15 Days Discharge Instructions: Use skin prep as directed Prim Dressing: Hydrofera Blue Ready Transfer Foam, 2.5x2.5 (in/in) 1 x Per Day/15 Days ary Discharge Instructions: Apply directly to wound bed as directed Prim Dressing: MediHoney Gel, tube 1.5 (oz) 1 x Per Day/15 Days ary Discharge Instructions: Apply to wound bed as instructed Secondary Dressing: Zetuvit Plus Silicone Border Dressing 4x4 (in/in) (Generic) 1 x Per Day/15 Days Discharge Instructions: Apply silicone border over primary dressing as directed. Secondary Dressing: tubigrip size D 1 x Per Day/15 Days Discharge Instructions: apply in the morning and remove at night. 1. In office sharp [name] 2. Medihoney and Hydrofera Blue 3. Vashe to clean the wound 4. Tubigrip daily 5. Follow-up in 1 week Electronic Signature(s) Signed: 01/24/2023 4:37:08 PM By: Geralyn Corwin DO Entered By: Geralyn Corwin on 01/24/2023  16:32:20 -------------------------------------------------------------------------------- HxROS [name] Patient Name: Date of Service: [name] Blanks, [name] [name] 01/24/2023 3:45 PM Medical Record Number: [id number] Patient Account Number: [id number] Date of Birth/Sex: Treating RN: May 23, 1976 (47 y.o. F) Primary Care Provider: Felix Pacini Other Clinician: Referring Provider: Treating Provider/Extender: Genene Churn, Renee Weeks in Treatment: 1 Information Obtained From Patient Constitutional Symptoms (General Health) Medical History: Past Medical History Notes: Angular cheilitis chicken pox thyroid disease- hashimoto's thyroiditis Eyes Medical History: Negative for: Cataracts; Glaucoma; Optic Neuritis Ear/Nose/Mouth/Throat Medical History: Negative for: Chronic sinus problems/congestion; Middle ear problems Hematologic/Lymphatic Medical History: Negative for: Anemia; Hemophilia; Human Immunodeficiency Virus; Lymphedema; Sickle Cell Disease Respiratory Medical History: Positive for: Sleep Apnea Negative for: Aspiration; Asthma; Chronic Obstructive Pulmonary Disease (COPD); Pneumothorax; Tuberculosis Cardiovascular Medical History: Negative for: Angina; Arrhythmia; Congestive Heart Failure; Coronary Artery Disease; Deep Vein Thrombosis; Hypertension; Hypotension; Myocardial Infarction; Peripheral Arterial Disease; Peripheral Venous Disease; Phlebitis; Vasculitis JANELL, WIEDEMANN [name] ([id number]) [id number] Page 9 of 10 Gastrointestinal Medical History: Negative for: Cirrhosis ; Colitis; Crohns; Hepatitis [name]; Hepatitis B; Hepatitis C Genitourinary Medical History: Negative for: End Stage Renal Disease Immunological Medical History: Negative for: Lupus Erythematosus; Raynauds; Scleroderma Integumentary (Skin) Medical History: Negative for: History of Burn Musculoskeletal Medical History: Negative for: Gout; Rheumatoid Arthritis;  Osteoarthritis; Osteomyelitis Past Medical History Notes: arthritis in back Neurologic Medical History: Negative for: Dementia; Neuropathy; Quadriplegia; Paraplegia; Seizure Disorder Oncologic Medical History: Negative for: Received Chemotherapy; Received Radiation Psychiatric Medical History: Positive for: Confinement Anxiety Negative for: Anorexia/bulimia Past Medical History Notes: ADD depression insomnia Immunizations Pneumococcal Vaccine: Received Pneumococcal Vaccination: No Implantable Devices Yes Hospitalization / Surgery History Type of Hospitalization/Surgery DandC 2018 C- section 2004 2007 cervical decom/discectomy fusion Family and Social History Cancer: Yes - Siblings,Maternal Grandparents; Diabetes: Yes - Maternal Grandparents; Heart Disease: Yes - Paternal Grandparents; Hereditary Spherocytosis: No; Hypertension: Yes - Mother; Kidney Disease: No; Lung Disease: No; Seizures: No;  Stroke: Yes - Father; Thyroid Problems: Yes - Maternal Grandparents,Mother; Tuberculosis: No; Never smoker; Marital Status - Divorced; Alcohol Use: Never; Drug Use: No History; Caffeine Use: Daily; Financial Concerns: No; Food, Clothing or Shelter Needs: No; Support System Lacking: No; Transportation Concerns: No Electronic Signature(s) Signed: 01/24/2023 4:37:08 PM By: Geralyn Corwin DO Entered By: Geralyn Corwin on 01/24/2023 16:29:13 [name] ([id number]) [id number] Page 10 of 10 -------------------------------------------------------------------------------- SuperBill [name] Patient Name: Date of Service: [name] [facility] [name] 01/24/2023 Medical Record Number: [id number] Patient Account Number: [id number] Date of Birth/Sex: Treating RN: 1975/09/10 (47 y.o. 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 Facility Procedures : CPT4 Code: [id number] Description: [phone] - [facility] 1ST 20 SQ CM OR < ICD-10 Diagnosis Description L97.812 Non-pressure chronic ulcer of other part of right lower leg with fat layer expos W54.0XXA Bitten by dog, initial encounter Modifier: ed Quantity: 1 Physician Procedures : CPT4 Code Description Modifier [id number] - WC PHYS DEBR WO ANESTH 20 SQ CM ICD-10 Diagnosis Description L97.812 Non-pressure chronic ulcer of other part of right lower leg with fat layer exposed W54.0XXA Bitten by dog, initial encounter Quantity: 1 Electronic Signature(s) Signed: 01/24/2023 4:37:08 PM By: Geralyn Corwin DO Entered By: Geralyn Corwin on 01/24/2023 16:32:30

## 2023-01-24 NOTE — Progress Notes (Signed)
LASHAI, STURGELL A ([id number]) [id number] Page 1 of 10 Visit Report for 01/24/2023 Arrival Information Details Patient Name: Date of Service: [name] [facility] A. 01/24/2023 3:45 PM Medical Record Number: [id number] Patient Account Number: [id number] Date of Birth/Sex: Treating RN: 1976-04-01 (47 y.o. Fredderick Phenix Primary Care Mordecai Tindol: Felix Pacini Other Clinician: Referring Broedy Osbourne: Treating Talynn Lebon/Extender: Lajuana Carry Weeks in Treatment: 1 Visit Information History Since Last Visit Added or deleted any medications: No Patient Arrived: Ambulatory Any new allergies or adverse reactions: No Arrival Time: 15:50 Had a fall or experienced change in No Accompanied By: self activities of daily living that may affect Transfer Assistance: None risk of falls: Patient Identification Verified: Yes Signs or symptoms of abuse/neglect since last visito No Secondary Verification Process Completed: Yes Hospitalized since last visit: No Patient Requires Transmission-Based Precautions: No Implantable device outside of the clinic excluding No Patient Has Alerts: Yes cellular tissue based products placed in the center Patient Alerts: Patient on Blood Thinner since last visit: Has Dressing in Place as Prescribed: Yes Pain Present Now: No Electronic Signature(s) Signed: 01/24/2023 4:28:55 PM By: Samuella Bruin Entered By: Samuella Bruin on 01/24/2023 15:50:40 -------------------------------------------------------------------------------- Encounter Discharge Information Details Patient Name: Date of Service: [name], [name] Borrow A. 01/24/2023 3:45 PM Medical Record Number: [id number] Patient Account Number: [id number] Date of Birth/Sex: Treating RN: [birth year] (47 y.o. Fredderick Phenix Primary Care Faline Langer: Felix Pacini Other Clinician: Referring Taavi Hoose: Treating Reizel Calzada/Extender: Lajuana Carry Weeks in  Treatment: 1 Encounter Discharge Information Items Post Procedure Vitals Discharge Condition: Stable Temperature (F): 98.4 Ambulatory Status: Ambulatory Pulse (bpm): 74 Discharge Destination: Home Respiratory Rate (breaths/min): 18 Transportation: Private Auto Blood Pressure (mmHg): 132/66 Accompanied By: self Schedule Follow-up Appointment: Yes Clinical Summary of Care: Patient Declined Electronic Signature(s) Signed: 01/24/2023 4:28:55 PM By: Samuella Bruin Entered By: Samuella Bruin on 01/24/2023 16:28:16 Melton Krebs A ([id number]) [id number] Page 2 of 10 -------------------------------------------------------------------------------- Lower Extremity Assessment Details Patient Name: Date of Service: [name] [facility] A. 01/24/2023 3:45 PM Medical Record Number: [id number] Patient Account Number: [id number] Date of Birth/Sex: Treating RN: 1976-04-30 (47 y.o. Fredderick Phenix Primary Care Yechezkel Fertig: Felix Pacini Other Clinician: Referring Kamiya Acord: Treating Talaysia Pinheiro/Extender: Genene Churn, Renee Weeks in Treatment: 1 Edema Assessment Assessed: [Left: No] [Right: No] [Left: Edema] [Right: :] Calf Left: Right: Point of Measurement: 35.5 cm From Medial Instep 41.3 cm Ankle Left: Right: Point of Measurement: 9 cm From Medial Instep 21.5 cm Vascular Assessment Extremity colors, hair growth, and conditions: Extremity Color: [Right:Normal] Hair Growth on Extremity: [Right:No] Temperature of Extremity: [Right:Warm] Capillary Refill: [Right:< 3 seconds] Dependent Rubor: [Right:No No] Electronic Signature(s) Signed: 01/24/2023 4:28:55 PM By: Samuella Bruin Entered By: Samuella Bruin on 01/24/2023 15:52:14 -------------------------------------------------------------------------------- Multi Wound Chart Details Patient Name: Date of Service: [name], [name] Borrow A. 01/24/2023 3:45 PM Medical Record Number:  [id number] Patient Account Number: [id number] Date of Birth/Sex: Treating RN: [birth year] (47 y.o. F) Primary Care Aldon Hengst: Felix Pacini Other Clinician: Referring Damyia Strider: Treating Belissa Kooy/Extender: Genene Churn, Renee Weeks in Treatment: 1 Vital Signs Height(in): 59 Pulse(bpm): 74 Weight(lbs): 218 Blood Pressure(mmHg): 132/86 Body Mass Index(BMI): 44 Temperature(F): 98.4 Respiratory Rate(breaths/min): 18 [1:Photos:] [N/A:N/A [id number] Page 3 of 10] Right, Lateral Lower Leg Right, Posterior Lower Leg N/A Wound Location: Bite Bite N/A Wounding Event: Trauma, Other Trauma, Other N/A Primary Etiology: Sleep Apnea, Confinement Anxiety Sleep Apnea, Confinement Anxiety N/A Comorbid History: 12/16/2022 12/16/2022 N/A Date Acquired: 1 1 N/A Weeks of Treatment: Open Open N/A Wound Status: No No  N/A Wound Recurrence: 0.6x1.5x0.3 0.5x4x0.2 N/A Measurements L x W x D (cm) 0.707 1.571 N/A A (cm) : rea 0.212 0.314 N/A Volume (cm) : 62.50% 20.00% N/A % Reduction in A rea: 62.50% 20.10% N/A % Reduction in Volume: Full Thickness Without Exposed Full Thickness Without Exposed N/A Classification: Support Structures Support Structures Medium Medium N/A Exudate A mount: Serosanguineous Serosanguineous N/A Exudate Type: red, brown red, brown N/A Exudate Color: Distinct, outline attached Distinct, outline attached N/A Wound Margin: Medium (34-66%) Medium (34-66%) N/A Granulation A mount: Red Red N/A Granulation Quality: Medium (34-66%) Medium (34-66%) N/A Necrotic A mount: Fat Layer (Subcutaneous Tissue): Yes Fat Layer (Subcutaneous Tissue): Yes N/A Exposed Structures: Fascia: No Fascia: No Tendon: No Tendon: No Muscle: No Muscle: No Joint: No Joint: No Bone: No Bone: No Small (1-33%) Small (1-33%) N/A Epithelialization: Debridement - Selective/Open Wound Debridement - Selective/Open Wound N/A Debridement: Pre-procedure  Verification/Time Out 16:18 16:18 N/A Taken: Lidocaine 4% Topical Solution Lidocaine 4% Topical Solution N/A Pain Control: [employer] N/A Tissue Debrided: Non-Viable Tissue Non-Viable Tissue N/A Level: 0.71 1.57 N/A Debridement A (sq cm): rea Curette Curette N/A Instrument: Minimum Minimum N/A Bleeding: Pressure Pressure N/A Hemostasis A chieved: Procedure was tolerated well Procedure was tolerated well N/A Debridement Treatment Response: 0.6x1.5x0.3 0.5x4x0.2 N/A Post Debridement Measurements L x W x D (cm) 0.212 0.314 N/A Post Debridement Volume: (cm) Excoriation: No Excoriation: No N/A Periwound Skin Texture: Induration: No Induration: No Callus: No Callus: No Crepitus: No Crepitus: No Rash: No Rash: No Scarring: No Scarring: No Maceration: No Maceration: No N/A Periwound Skin Moisture: Dry/Scaly: No Dry/Scaly: No Atrophie Blanche: No Atrophie Blanche: No N/A Periwound Skin Color: Cyanosis: No Cyanosis: No Ecchymosis: No Ecchymosis: No Erythema: No Erythema: No Hemosiderin Staining: No Hemosiderin Staining: No Mottled: No Mottled: No Pallor: No Pallor: No Rubor: No Rubor: No Debridement Debridement N/A Procedures Performed: Treatment Notes Wound #1 (Lower Leg) Wound Laterality: Right, Lateral Cleanser Soap and Water Discharge Instruction: May shower and wash wound with dial antibacterial soap and water prior to dressing change. Vashe 5.8 (oz) Discharge Instruction: Cleanse the wound with Vashe prior to applying a clean dressing using gauze sponges, not tissue or cotton balls. Peri-Wound Care Topical Primary Dressing Hydrofera Blue Ready Transfer Foam, 2.5x2.5 (in/in) Discharge Instruction: Apply directly to wound bed as directed MediHoney Gel, tube 1.5 (oz) MARVEL, RASH A ([id number]) [id number] Page 4 of 10 Discharge Instruction: Apply to wound bed as instructed Secondary Dressing Zetuvit Plus Silicone  Border Dressing 4x4 (in/in) Discharge Instruction: Apply silicone border over primary dressing as directed. tubigrip size D Discharge Instruction: apply in the morning and remove at night. Secured With Compression Wrap Compression Stockings Add-Ons Wound #2 (Lower Leg) Wound Laterality: Right, Posterior Cleanser Soap and Water Discharge Instruction: May shower and wash wound with dial antibacterial soap and water prior to dressing change. Peri-Wound Care Skin Prep Discharge Instruction: Use skin prep as directed Topical Primary Dressing Hydrofera Blue Ready Transfer Foam, 2.5x2.5 (in/in) Discharge Instruction: Apply directly to wound bed as directed MediHoney Gel, tube 1.5 (oz) Discharge Instruction: Apply to wound bed as instructed Secondary Dressing Zetuvit Plus Silicone Border Dressing 4x4 (in/in) Discharge Instruction: Apply silicone border over primary dressing as directed. tubigrip size D Discharge Instruction: apply in the morning and remove at night. Secured With Compression Wrap Compression Stockings Facilities manager) Signed: 01/24/2023 4:37:08 PM By: Geralyn Corwin DO Entered By: Geralyn Corwin on 01/24/2023 16:28:11 -------------------------------------------------------------------------------- Multi-Disciplinary Care Plan Details Patient Name: Date of Service: [name], Dedra Skeens [facility]  A. 01/24/2023 3:45 PM Medical Record Number: [id number] Patient Account Number: [id number] Date of Birth/Sex: Treating RN: 01-19-1976 (47 y.o. Fredderick Phenix Primary Care Calvin Jablonowski: Felix Pacini Other Clinician: Referring Prerna Harold: Treating Von Quintanar/Extender: Genene Churn, Renee Weeks in Treatment: 1 Active Inactive Necrotic Tissue KENZLEI, KEMNITZ A ([id number]) [id number] Page 5 of 10 Nursing Diagnoses: Knowledge deficit related to management of necrotic/devitalized tissue Goals: Necrotic/devitalized tissue will be  minimized in the wound bed Date Initiated: 01/16/2023 Target Resolution Date: 02/17/2023 Goal Status: Active Patient/caregiver will verbalize understanding of reason and process for debridement of necrotic tissue Date Initiated: 01/16/2023 Target Resolution Date: 02/16/2023 Goal Status: Active Interventions: Assess patient pain level pre-, during and post procedure and prior to discharge Provide education on necrotic tissue and debridement process Treatment Activities: Apply topical anesthetic as ordered : 01/16/2023 Biologic debridement : 01/16/2023 Notes: Nutrition Nursing Diagnoses: Potential for alteratiion in Nutrition/Potential for imbalanced nutrition Goals: Patient/caregiver agrees to and verbalizes understanding of need to obtain nutritional consultation Date Initiated: 01/16/2023 Target Resolution Date: 02/16/2023 Goal Status: Active Interventions: Assess patient nutrition upon admission and as needed per policy Provide education on nutrition Treatment Activities: Patient referred to Primary Care Physician for further nutritional evaluation : 01/16/2023 Notes: Orientation to the Wound Care Program Nursing Diagnoses: Knowledge deficit related to the wound healing center program Goals: Patient/caregiver will verbalize understanding of the Wound Healing Center Program Date Initiated: 01/16/2023 Target Resolution Date: 02/17/2023 Goal Status: Active Interventions: Provide education on orientation to the wound center Notes: Wound/Skin Impairment Nursing Diagnoses: Knowledge deficit related to ulceration/compromised skin integrity Goals: Patient/caregiver will verbalize understanding of skin care regimen Date Initiated: 01/16/2023 Target Resolution Date: 02/17/2023 Goal Status: Active Interventions: Assess patient/caregiver ability to perform ulcer/skin care regimen upon admission and as needed Assess ulceration(s) every visit Provide education on ulcer and skin care Treatment  Activities: Skin care regimen initiated : 01/16/2023 Topical wound management initiated : 01/16/2023 Notes: Electronic Signature(s) TACHE, CUTRONE A ([id number]) [id number] Page 6 of 10 Signed: 01/24/2023 4:28:55 PM By: Samuella Bruin Entered By: Samuella Bruin on 01/24/2023 16:00:13 -------------------------------------------------------------------------------- Pain Assessment Details Patient Name: Date of Service: [name] [facility] A. 01/24/2023 3:45 PM Medical Record Number: [id number] Patient Account Number: [id number] Date of Birth/Sex: Treating RN: 1976/03/31 (47 y.o. Fredderick Phenix Primary Care Eila Runyan: Felix Pacini Other Clinician: Referring Nilesh Stegall: Treating Gillian Kluever/Extender: Lajuana Carry Weeks in Treatment: 1 Active Problems Location of Pain Severity and Description of Pain Patient Has Paino No Site Locations Rate the pain. Current Pain Level: 0 Pain Management and Medication Current Pain Management: Electronic Signature(s) Signed: 01/24/2023 4:28:55 PM By: Samuella Bruin Entered By: Samuella Bruin on 01/24/2023 15:51:03 -------------------------------------------------------------------------------- Patient/Caregiver Education Details Patient Name: Date of Service: Tillman Abide 8/13/2024andnbsp3:45 PM Medical Record Number: [id number] Patient Account Number: [id number] Date of Birth/Gender: Treating RN: Aug 02, 1975 (47 y.o. Fredderick Phenix Primary Care Physician: Felix Pacini Other Clinician: Referring Physician: Treating Physician/Extender: Delfin Edis in Treatment: 1 Education Assessment Education Provided To: Patient RAKESHIA, SEWELL ([id number]) [id number] Page 7 of 10 Education Topics Provided Wound/Skin Impairment: Methods: Explain/Verbal Responses: Reinforcements needed, State content correctly Electronic  Signature(s) Signed: 01/24/2023 4:28:55 PM By: Samuella Bruin Entered By: Samuella Bruin on 01/24/2023 16:00:29 -------------------------------------------------------------------------------- Wound Assessment Details Patient Name: Date of Service: [name] A. 01/24/2023 3:45 PM Medical Record Number: [id number] Patient Account Number: [id number] Date of Birth/Sex: Treating RN: [birth year] (47 y.o. Fredderick Phenix Primary Care Carlos Heber: Felix Pacini Other Clinician: Referring Tahisha Hakim: Treating Magin Balbi/Extender:  Geralyn Corwin [place], Renee Weeks in Treatment: 1 Wound Status Wound Number: 1 Primary Etiology: Trauma, Other Wound Location: Right, Lateral Lower Leg Wound Status: Open Wounding Event: Bite Comorbid History: Sleep Apnea, Confinement Anxiety Date Acquired: 12/16/2022 Weeks Of Treatment: 1 Clustered Wound: No Photos Wound Measurements Length: (cm) 0.6 Width: (cm) 1.5 Depth: (cm) 0.3 Area: (cm) 0.707 Volume: (cm) 0.212 % Reduction in Area: 62.5% % Reduction in Volume: 62.5% Epithelialization: Small (1-33%) Tunneling: No Undermining: No Wound Description Classification: Full Thickness Without Exposed Support Structures Wound Margin: Distinct, outline attached Exudate Amount: Medium Exudate Type: Serosanguineous Exudate Color: red, brown Foul Odor After Cleansing: No Slough/Fibrino Yes Wound Bed Granulation Amount: Medium (34-66%) Exposed Structure Granulation Quality: Red Fascia Exposed: No Necrotic Amount: Medium (34-66%) Fat Layer (Subcutaneous Tissue) Exposed: Yes Necrotic Quality: Adherent Slough Tendon Exposed: No Muscle Exposed: No Joint Exposed: No Bone Exposed: No Periwound Skin Texture Hoeschen, Anahis A ([id number]) [id number] Page 8 of 10 Texture Color No Abnormalities Noted: No No Abnormalities Noted: No Callus: No Atrophie Blanche: No Crepitus: No Cyanosis: No Excoriation: No Ecchymosis:  No Induration: No Erythema: No Rash: No Hemosiderin Staining: No Scarring: No Mottled: No Pallor: No Moisture Rubor: No No Abnormalities Noted: No Dry / Scaly: No Maceration: No Treatment Notes Wound #1 (Lower Leg) Wound Laterality: Right, Lateral Cleanser Soap and Water Discharge Instruction: May shower and wash wound with dial antibacterial soap and water prior to dressing change. Vashe 5.8 (oz) Discharge Instruction: Cleanse the wound with Vashe prior to applying a clean dressing using gauze sponges, not tissue or cotton balls. Peri-Wound Care Topical Primary Dressing Hydrofera Blue Ready Transfer Foam, 2.5x2.5 (in/in) Discharge Instruction: Apply directly to wound bed as directed MediHoney Gel, tube 1.5 (oz) Discharge Instruction: Apply to wound bed as instructed Secondary Dressing Zetuvit Plus Silicone Border Dressing 4x4 (in/in) Discharge Instruction: Apply silicone border over primary dressing as directed. tubigrip size D Discharge Instruction: apply in the morning and remove at night. Secured With Compression Wrap Compression Stockings Facilities manager) Signed: 01/24/2023 4:28:55 PM By: Samuella Bruin Entered By: Samuella Bruin on 01/24/2023 15:56:37 -------------------------------------------------------------------------------- Wound Assessment Details Patient Name: Date of Service: [name] [facility] A. 01/24/2023 3:45 PM Medical Record Number: [id number] Patient Account Number: [id number] Date of Birth/Sex: Treating RN: 10/25/1975 (47 y.o. Fredderick Phenix Primary Care Jimy Gates: Felix Pacini Other Clinician: Referring Roxine Whittinghill: Treating Larrissa Stivers/Extender: Genene Churn, Renee Weeks in Treatment: 1 Wound Status Wound Number: 2 Primary Etiology: Trauma, Other Wound Location: Right, Posterior Lower Leg Wound Status: Open Wounding Event: Bite Comorbid History: Sleep Apnea, Confinement Anxiety Date Acquired:  12/16/2022 Weeks Of Treatment: 1 Clustered Wound: No KEMIAH, ROLF A ([id number]) [id number] Page 9 of 10 Photos Wound Measurements Length: (cm) 0.5 Width: (cm) 4 Depth: (cm) 0.2 Area: (cm) 1.571 Volume: (cm) 0.314 % Reduction in Area: 20% % Reduction in Volume: 20.1% Epithelialization: Small (1-33%) Tunneling: No Undermining: No Wound Description Classification: Full Thickness Without Exposed Suppor Wound Margin: Distinct, outline attached Exudate Amount: Medium Exudate Type: Serosanguineous Exudate Color: red, brown t Structures Foul Odor After Cleansing: No Slough/Fibrino Yes Wound Bed Granulation Amount: Medium (34-66%) Exposed Structure Granulation Quality: Red Fascia Exposed: No Necrotic Amount: Medium (34-66%) Fat Layer (Subcutaneous Tissue) Exposed: Yes Necrotic Quality: Adherent Slough Tendon Exposed: No Muscle Exposed: No Joint Exposed: No Bone Exposed: No Periwound Skin Texture Texture Color No Abnormalities Noted: No No Abnormalities Noted: No Callus: No Atrophie Blanche: No Crepitus: No Cyanosis: No Excoriation: No Ecchymosis: No Induration: No Erythema: No Rash: No  Hemosiderin Staining: No Scarring: No Mottled: No Pallor: No Moisture Rubor: No No Abnormalities Noted: No Dry / Scaly: No Maceration: No Treatment Notes Wound #2 (Lower Leg) Wound Laterality: Right, Posterior Cleanser Soap and Water Discharge Instruction: May shower and wash wound with dial antibacterial soap and water prior to dressing change. Peri-Wound Care Skin Prep Discharge Instruction: Use skin prep as directed Topical Primary Dressing Hydrofera Blue Ready Transfer Foam, 2.5x2.5 (in/in) Discharge Instruction: Apply directly to wound bed as directed MediHoney Gel, tube 1.5 (oz) Discharge Instruction: Apply to wound bed as instructed Secondary Dressing TRINIE, SPENO A ([id number]) [id number] Page 10 of  10 Zetuvit Plus Silicone Border Dressing 4x4 (in/in) Discharge Instruction: Apply silicone border over primary dressing as directed. tubigrip size D Discharge Instruction: apply in the morning and remove at night. Secured With Compression Wrap Compression Stockings Facilities manager) Signed: 01/24/2023 4:28:55 PM By: Samuella Bruin Entered By: Samuella Bruin on 01/24/2023 15:57:09 -------------------------------------------------------------------------------- Vitals Details Patient Name: Date of Service: [name], [name] Borrow A. 01/24/2023 3:45 PM Medical Record Number: [id number] Patient Account Number: [id number] Date of Birth/Sex: Treating RN: 02/28/1976 (47 y.o. Fredderick Phenix Primary Care Laisha Rau: Felix Pacini Other Clinician: Referring Dae Antonucci: Treating Zaire Vanbuskirk/Extender: Genene Churn, Renee Weeks in Treatment: 1 Vital Signs Time Taken: 15:50 Temperature (F): 98.4 Height (in): 59 Pulse (bpm): 74 Weight (lbs): 218 Respiratory Rate (breaths/min): 18 Body Mass Index (BMI): 44 Blood Pressure (mmHg): 132/86 Reference Range: 80 - 120 mg / dl Electronic Signature(s) Signed: 01/24/2023 4:28:55 PM By: Samuella Bruin Entered By: Samuella Bruin on 01/24/2023 15:50:57

## 2023-01-30 ENCOUNTER — Encounter (HOSPITAL_BASED_OUTPATIENT_CLINIC_OR_DEPARTMENT_OTHER): Payer: 59 | Admitting: General Surgery

## 2023-01-31 ENCOUNTER — Encounter: Payer: 59 | Admitting: Family Medicine

## 2023-01-31 ENCOUNTER — Encounter (HOSPITAL_BASED_OUTPATIENT_CLINIC_OR_DEPARTMENT_OTHER): Payer: 59 | Admitting: Internal Medicine

## 2023-01-31 DIAGNOSIS — W540XXA Bitten by dog, initial encounter: Secondary | ICD-10-CM

## 2023-01-31 DIAGNOSIS — L97812 Non-pressure chronic ulcer of other part of right lower leg with fat layer exposed: Secondary | ICD-10-CM | POA: Diagnosis not present

## 2023-02-01 NOTE — Progress Notes (Signed)
[name] ([id number]) [id number] Page 1 of 10 Visit Report for 01/31/2023 Chief Complaint Document Details Patient Name: Date of Service: [name] [facility] [name] 01/31/2023 3:45 PM Medical Record Number: [id number] Patient Account Number: [id number] Date of Birth/Sex: Treating RN: [birth year] (47 y.o. F) Primary Care Provider: Felix Pacini Other Clinician: Referring Provider: Treating Provider/Extender: Genene Churn, Renee Weeks in Treatment: 2 Information Obtained from: Patient Chief Complaint 01/16/2023; right lower extremity wound from [name] dog bite Electronic Signature(s) Signed: 01/31/2023 4:28:07 PM By: Geralyn Corwin DO Entered By: Geralyn Corwin on 01/31/2023 13:25:12 -------------------------------------------------------------------------------- Debridement Details Patient Name: Date of Service: [name], [name] Borrow [name] 01/31/2023 3:45 PM Medical Record Number: [id number] Patient Account Number: [id number] Date of Birth/Sex: Treating RN: 03-22-1976 (47 y.o. [name] Primary Care Provider: Felix Pacini Other Clinician: Referring Provider: Treating Provider/Extender: Lajuana Carry Weeks in Treatment: 2 Debridement Performed for Assessment: Wound #1 Right,Lateral Lower Leg Performed By: Physician Geralyn Corwin, DO Debridement Type: Debridement Level of Consciousness (Pre-procedure): Awake and Alert Pre-procedure Verification/Time Out Yes - 16:18 Taken: Start Time: 16:19 Pain Control: Lidocaine 4% T opical Solution Percent of Wound Bed Debrided: 100% T Area Debrided (cm): otal 0.16 Tissue and other material debrided: Viable, Non-Viable, Slough, Subcutaneous, Skin: Dermis , Skin: Epidermis, Slough Level: Skin/Subcutaneous Tissue Debridement Description: Excisional Instrument: Curette Bleeding: Minimum Hemostasis Achieved: Pressure End Time: 16:22 Procedural Pain: 0 Post Procedural Pain: 0 Response to  Treatment: Procedure was tolerated well Level of Consciousness (Post- Awake and Alert procedure): Post Debridement Measurements of Total Wound Length: (cm) 0.2 Width: (cm) 1 Depth: (cm) 0.3 Volume: (cm) 0.047 Character of Wound/Ulcer Post Debridement: Improved TRYSTIN, ETTEN [name] ([id number]) [id number] Page 2 of 10 Post Procedure Diagnosis Same as Pre-procedure Electronic Signature(s) Signed: 01/31/2023 4:28:07 PM By: Geralyn Corwin DO Signed: 01/31/2023 5:13:11 PM By: Shawn Stall RN, BSN Entered By: Shawn Stall on 01/31/2023 13:22:45 -------------------------------------------------------------------------------- Debridement Details Patient Name: Date of Service: [name] [name] 01/31/2023 3:45 PM Medical Record Number: [id number] Patient Account Number: [id number] Date of Birth/Sex: Treating RN: Jan 09, 1976 (47 y.o. [name] Primary Care Provider: Felix Pacini Other Clinician: Referring Provider: Treating Provider/Extender: Lajuana Carry Weeks in Treatment: 2 Debridement Performed for Assessment: Wound #2 Right,Posterior Lower Leg Performed By: Physician Geralyn Corwin, DO Debridement Type: Debridement Level of Consciousness (Pre-procedure): Awake and Alert Pre-procedure Verification/Time Out Yes - 16:18 Taken: Start Time: 16:19 Pain Control: Lidocaine 4% T opical Solution Percent of Wound Bed Debrided: 100% T Area Debrided (cm): otal 0.79 Tissue and other material debrided: Viable, Non-Viable, Slough, Subcutaneous, Skin: Dermis , Skin: Epidermis, Slough Level: Skin/Subcutaneous Tissue Debridement Description: Excisional Instrument: Curette Bleeding: Minimum Hemostasis Achieved: Pressure End Time: 16:22 Procedural Pain: 0 Post Procedural Pain: 0 Response to Treatment: Procedure was tolerated well Level of Consciousness (Post- Awake and Alert procedure): Post Debridement Measurements of Total  Wound Length: (cm) 0.4 Width: (cm) 2.5 Depth: (cm) 0.2 Volume: (cm) 0.157 Character of Wound/Ulcer Post Debridement: Improved Post Procedure Diagnosis Same as Pre-procedure Electronic Signature(s) Signed: 01/31/2023 4:28:07 PM By: Geralyn Corwin DO Signed: 01/31/2023 5:13:11 PM By: Shawn Stall RN, BSN Entered By: Shawn Stall on 01/31/2023 13:23:06 -------------------------------------------------------------------------------- HPI Details Patient Name: Date of Service: [name] [name] 01/31/2023 3:45 PM Medical Record Number: [id number] Patient Account Number: [id number] [name] ([id number]) [id number] Page 3 of 10 Date of Birth/Sex: Treating RN: March 08, 1976 (47 y.o. F) Primary Care Provider: Other Clinician: Felix Pacini Referring Provider: Treating Provider/Extender: Genene Churn, Renee  Weeks in Treatment: 2 History of Present Illness ssociated Signs and Symptoms: ABI done on 01/16/2023 was 0.9 [name] HPI Description: 01/16/2023 [name] is [name] with [name] past medical history of Hashimoto's thyroiditis that presents the clinic for [name] 1 month history of nonhealing wound to the right lower extremity. She was bit by [name] dog on 12/16/2022 and visited the ED for this issue. She had stitches placed and was started on oral antibiotics. The wound dehisced and remaining sutures were taken out by the ED. On 7/19 she again revisited the ED for wound check and was started on clindamycin. Patient has completed this course and currently denies signs of infection. She is also been following with her primary care physician for this issue. She has been using bag balm to the wound bed. Her Tdap vaccine is up-to-date and she had [name] rabies vaccine. 8/13; patient presents for follow-up. She has been using Vashe wet-to-dry dressings to the wound beds. The wounds are smaller. She has no issues or complaints today. 8/20; patient presents  for follow-up. She has been using Medihoney and Hydrofera Blue to the wound bed. The wounds are smaller. She has no issues or complaints. Electronic Signature(s) Signed: 01/31/2023 4:28:07 PM By: Geralyn Corwin DO Entered By: Geralyn Corwin on 01/31/2023 13:25:38 -------------------------------------------------------------------------------- Physical Exam Details Patient Name: Date of Service: [name] AMarland Kitchen 01/31/2023 3:45 PM Medical Record Number: [id number] Patient Account Number: [id number] Date of Birth/Sex: Treating RN: 04-01-1976 (47 y.o. F) Primary Care Provider: Felix Pacini Other Clinician: Referring Provider: Treating Provider/Extender: Genene Churn, Renee Weeks in Treatment: 2 Constitutional respirations regular, non-labored and within target range for patient.. Cardiovascular 2+ dorsalis pedis/posterior tibialis pulses. Psychiatric pleasant and cooperative. Notes T the right lower extremity posterior aspect there are 2 open wounds with nonviable tissue and granulation tissue to the wound beds. No surrounding signs of o infection including increased warmth, erythema or purulent drainage. Electronic Signature(s) Signed: 01/31/2023 4:28:07 PM By: Geralyn Corwin DO Entered By: Geralyn Corwin on 01/31/2023 13:26:13 -------------------------------------------------------------------------------- Physician Orders Details Patient Name: Date of Service: [name] [name] 01/31/2023 3:45 PM Medical Record Number: [id number] Patient Account Number: [id number] Date of Birth/Sex: Treating RN: 10-17-1975 (47 y.o. 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 - Dr. Mikey Bussing room 8 Monday or Tuesday 02/07/2023 345pm (already scheduled) Return [name] ppointment in 2 weeks. - Dr. Mikey Bussing room 8 Monday or Tuesday (already scheduled) Return [name] Return appointment in 3 weeks. - Dr. Mikey Bussing room8 Monday or Tuesday (already scheduled) Anesthetic (In clinic) Topical Lidocaine 4% applied to wound bed Bathing/ Shower/ Hygiene May shower and wash wound with soap and water. Edema Control - Lymphedema / SCD / Other Elevate legs to the level of the heart or above for 30 minutes daily and/or when sitting for 3-4 times [name] day throughout the day. Avoid standing for long periods of time. Other Edema Control Orders/Instructions: - tubrigrip size D apply in the morning and remove at night. Wound Treatment Wound #1 - Lower Leg Wound Laterality: Right, Lateral Cleanser: Soap and Water 1 x Per Day/15 Days Discharge Instructions: May  shower and wash wound with dial antibacterial soap and water prior to dressing change. Cleanser: Vashe 5.8 (oz) 1 x Per Day/15 Days Discharge Instructions: Cleanse the wound with Vashe prior to applying [name] clean dressing using gauze sponges, not tissue or cotton balls. Prim Dressing: Hydrofera Blue Ready Transfer Foam, 2.5x2.5 (in/in) 1 x Per Day/15 Days ary Discharge Instructions: Apply directly to wound bed as directed Prim Dressing: MediHoney Gel, tube 1.5 (oz) 1 x Per Day/15 Days ary Discharge Instructions: Apply to wound bed as instructed Secondary Dressing: Zetuvit Plus Silicone Border Dressing 4x4 (in/in) (Generic) 1 x Per Day/15 Days Discharge Instructions: Apply silicone border over primary dressing as directed. Secondary Dressing: tubigrip size D 1 x Per Day/15 Days Discharge Instructions: apply in the morning and remove at night. Wound #2  - Lower Leg Wound Laterality: Right, Posterior Cleanser: Soap and Water 1 x Per Day/15 Days Discharge Instructions: May shower and wash wound with dial antibacterial soap and water prior to dressing change. Peri-Wound Care: Skin Prep (Generic) 1 x Per Day/15 Days Discharge Instructions: Use skin prep as directed Prim Dressing: Hydrofera Blue Ready Transfer Foam, 2.5x2.5 (in/in) 1 x Per Day/15 Days ary Discharge Instructions: Apply directly to wound bed as directed Prim Dressing: MediHoney Gel, tube 1.5 (oz) 1 x Per Day/15 Days ary Discharge Instructions: Apply to wound bed as instructed Secondary Dressing: Zetuvit Plus Silicone Border Dressing 4x4 (in/in) (Generic) 1 x Per Day/15 Days Discharge Instructions: Apply silicone border over primary dressing as directed. Secondary Dressing: tubigrip size D 1 x Per Day/15 Days Discharge Instructions: apply in the morning and remove at night. Electronic Signature(s) Signed: 01/31/2023 4:28:07 PM By: Geralyn Corwin DO Entered By: Geralyn Corwin on 01/31/2023 13:26:22 [name] ([id number]) [id number] Page 5 of 10 -------------------------------------------------------------------------------- Problem List Details Patient Name: Date of Service: Tillman Abide 01/31/2023 3:45 PM Medical Record Number: [id number] Patient Account Number: [id number] Date of Birth/Sex: Treating RN: 08/11/1975 (47 y.o. Arta Silence Primary Care Provider: Felix Pacini Other Clinician: Referring Provider: Treating Provider/Extender: Lajuana Carry Weeks in Treatment: 2 Active Problems ICD-10 Encounter Code Description Active Date MDM Diagnosis [phone] Non-pressure chronic ulcer of other part of right lower leg with fat layer 01/16/2023 No Yes exposed W54.0XXA Bitten by dog, initial encounter 01/16/2023 No Yes E06.3 Autoimmune thyroiditis 01/16/2023 No Yes Inactive Problems Resolved Problems Electronic  Signature(s) Signed: 01/31/2023 4:28:07 PM By: Geralyn Corwin DO Entered By: Geralyn Corwin on 01/31/2023 13:25:01 -------------------------------------------------------------------------------- Progress Note Details Patient Name: Date of Service: [name] [name] 01/31/2023 3:45 PM Medical Record Number: [id number] Patient Account Number: [id number] Date of Birth/Sex: Treating RN: 1976/02/19 (47 y.o. F) Primary Care Provider: Felix Pacini Other Clinician: Referring Provider: Treating Provider/Extender: Genene Churn, Renee Weeks in Treatment: 2 Subjective Chief Complaint Information obtained from Patient 01/16/2023; right lower extremity wound from [name] dog bite History of Present Illness (HPI) The following HPI elements were documented for the patient's wound: Associated Signs and Symptoms: ABI done on 01/16/2023 was 0.9 01/16/2023 Ms. [name] is [name] with [name] past medical history of Hashimoto's thyroiditis that presents the clinic for [name] 1 month history of nonhealing wound to the right lower extremity. She was bit by [name] dog on 12/16/2022 and visited the ED for this issue. She had stitches placed and was started on oral antibiotics. The wound dehisced and remaining sutures were taken out by the ED. On 7/19 she again revisited the ED for wound check and was started  on clindamycin. Patient has completed this course and currently denies signs of infection. She is also been following with her primary care physician for this issue. She has been using bag balm to the wound bed. Her Tdap vaccine is up-to-date and she had [name] rabies vaccine. MAURINE, CRADLE [name] ([id number]) [id number] Page 6 of 10 8/13; patient presents for follow-up. She has been using Vashe wet-to-dry dressings to the wound beds. The wounds are smaller. She has no issues or complaints today. 8/20; patient presents for follow-up. She has been using Medihoney and Hydrofera  Blue to the wound bed. The wounds are smaller. She has no issues or complaints. Patient History Information obtained from Patient. Family History Cancer - Siblings,Maternal Grandparents, Diabetes - Maternal Grandparents, Heart Disease - Paternal Grandparents, Hypertension - Mother, Stroke - Father, Thyroid Problems - Maternal Grandparents,Mother, No family history of Hereditary Spherocytosis, Kidney Disease, Lung Disease, Seizures, Tuberculosis. Social History Never smoker, Marital Status - Divorced, Alcohol Use - Never, Drug Use - No History, Caffeine Use - Daily. Medical History Eyes Denies history of Cataracts, Glaucoma, Optic Neuritis Ear/Nose/Mouth/Throat Denies history of Chronic sinus problems/congestion, Middle ear problems Hematologic/Lymphatic Denies history of Anemia, Hemophilia, Human Immunodeficiency Virus, Lymphedema, Sickle Cell Disease Respiratory Patient has history of Sleep Apnea Denies history of Aspiration, Asthma, Chronic Obstructive Pulmonary Disease (COPD), Pneumothorax, Tuberculosis Cardiovascular Denies history of Angina, Arrhythmia, Congestive Heart Failure, Coronary Artery Disease, Deep Vein Thrombosis, Hypertension, Hypotension, Myocardial Infarction, Peripheral Arterial Disease, Peripheral Venous Disease, Phlebitis, Vasculitis Gastrointestinal Denies history of Cirrhosis , Colitis, Crohns, Hepatitis [name], Hepatitis B, Hepatitis C Genitourinary Denies history of End Stage Renal Disease Immunological Denies history of Lupus Erythematosus, Raynauds, Scleroderma Integumentary (Skin) Denies history of History of Burn Musculoskeletal Denies history of Gout, Rheumatoid Arthritis, Osteoarthritis, Osteomyelitis Neurologic Denies history of Dementia, Neuropathy, Quadriplegia, Paraplegia, Seizure Disorder Oncologic Denies history of Received Chemotherapy, Received Radiation Psychiatric Patient has history of Confinement Anxiety Denies history of  Anorexia/bulimia Hospitalization/Surgery History - [facility] 2018. - C- section 2004 2007. - cervical decom/discectomy fusion. 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 Vitals Time Taken: 4:17 PM, Height: 59 in, Weight: 218 lbs, BMI: 44, Temperature: 98.3 F, Pulse: 101 bpm, Respiratory Rate: 18 breaths/min, Blood Pressure: 118/83 mmHg. Cardiovascular 2+ dorsalis pedis/posterior tibialis pulses. Psychiatric pleasant and cooperative. General Notes: T the right lower extremity posterior aspect there are 2 open wounds with nonviable tissue and granulation tissue to the wound beds. No o surrounding signs of infection including increased warmth, erythema or purulent drainage. Integumentary (Hair, Skin) Wound #1 status is Open. Original cause of wound was Bite. The date acquired was: 12/16/2022. The wound has been in treatment 2 weeks. The wound is located NASYAH, GOTH [name] ([id number]) [id number] Page 7 of 10 on the Right,Lateral Lower Leg. The wound measures 0.2cm length x 1cm width x 0.3cm depth; 0.157cm^2 area and 0.047cm^3 volume. There is Fat Layer (Subcutaneous Tissue) exposed. There is no tunneling or undermining noted. There is [name] medium amount of serosanguineous drainage noted. The wound margin is distinct with the outline attached to the wound base. There is medium (34-66%) red granulation within the wound bed. There is [name] medium (34-66%) amount of necrotic tissue within the wound bed including Adherent Slough. The periwound skin appearance did not exhibit: Callus, Crepitus, Excoriation, Induration, Rash, Scarring, Dry/Scaly, Maceration, Atrophie Blanche, Cyanosis, Ecchymosis, Hemosiderin  Staining, Mottled, Pallor, Rubor, Erythema. Wound #2 status is Open. Original cause of wound was Bite. The date acquired was: 12/16/2022. The wound has been in treatment 2 weeks. The wound is located on the Right,Posterior Lower Leg. The wound measures 0.4cm length x 2.5cm width x 0.2cm depth; 0.785cm^2 area and 0.157cm^3 volume. There is Fat Layer (Subcutaneous Tissue) exposed. There is no tunneling or undermining noted. There is [name] medium amount of serosanguineous drainage noted. The wound margin is distinct with the outline attached to the wound base. There is medium (34-66%) red granulation within the wound bed. There is [name] medium (34-66%) amount of necrotic tissue within the wound bed including Adherent Slough. The periwound skin appearance did not exhibit: Callus, Crepitus, Excoriation, Induration, Rash, Scarring, Dry/Scaly, Maceration, Atrophie Blanche, Cyanosis, Ecchymosis, Hemosiderin Staining, Mottled, Pallor, Rubor, Erythema. Assessment Active Problems ICD-10 Non-pressure chronic ulcer of other part of right lower leg with fat layer exposed Bitten by dog, initial encounter Autoimmune thyroiditis Patient's wounds appear well-healing. I debrided nonviable tissue. I recommended continuing with Medihoney and Hydrofera Blue. I recommended she use Tubigrip daily. Follow-up in 1 week. Procedures Wound #1 Pre-procedure diagnosis of Wound #1 is [name] Trauma, Other located on the Right,Lateral Lower Leg . There was [name] Excisional Skin/Subcutaneous Tissue Debridement with [name] total area of 0.16 sq cm performed by Geralyn Corwin, DO. With the following instrument(s): Curette to remove Viable and Non-Viable tissue/material. Material removed includes Subcutaneous Tissue, Slough, Skin: Dermis, and Skin: Epidermis after achieving pain control using Lidocaine 4% T opical Solution. [name] time out was conducted at 16:18, prior to the start of the procedure. [name] Minimum amount of bleeding was controlled with Pressure.  The procedure was tolerated well with [name] pain level of 0 throughout and [name] pain level of 0 following the procedure. Post Debridement Measurements: 0.2cm length x 1cm width x 0.3cm depth; 0.047cm^3 volume. Character of Wound/Ulcer Post Debridement is improved. Post procedure Diagnosis Wound #1: Same as Pre-Procedure Wound #2 Pre-procedure diagnosis of Wound #2 is [name] Trauma, Other located on the Right,Posterior Lower Leg . There was [name] Excisional Skin/Subcutaneous Tissue Debridement with [name] total area of 0.79 sq cm performed by Geralyn Corwin, DO. With the following instrument(s): Curette to remove Viable and Non-Viable tissue/material. Material removed includes Subcutaneous Tissue, Slough, Skin: Dermis, and Skin: Epidermis after achieving pain control using Lidocaine 4% T opical Solution. [name] time out was conducted at 16:18, prior to the start of the procedure. [name] Minimum amount of bleeding was controlled with Pressure. The procedure was tolerated well with [name] pain level of 0 throughout and [name] pain level of 0 following the procedure. Post Debridement Measurements: 0.4cm length x 2.5cm width x 0.2cm depth; 0.157cm^3 volume. Character of Wound/Ulcer Post Debridement is improved. Post procedure Diagnosis Wound #2: Same as Pre-Procedure Plan Follow-up Appointments: Return Appointment in 1 week. - Dr. Mikey Bussing room 8 Monday or Tuesday 02/07/2023 345pm (already scheduled) Return Appointment in 2 weeks. - Dr. Mikey Bussing room 8 Monday or Tuesday (already scheduled) Return appointment in 3 weeks. - Dr. Mikey Bussing room8 Monday or Tuesday (already scheduled) Anesthetic: (In clinic) Topical Lidocaine 4% applied to wound bed Bathing/ Shower/ Hygiene: May shower and wash wound with soap and water. Edema Control - Lymphedema / SCD / Other: Elevate legs to the level of the heart or above for 30 minutes daily and/or when sitting for 3-4 times [name] day throughout the day. Avoid standing for long periods of time. Other Edema  Control Orders/Instructions: - tubrigrip size D apply in  the morning and remove at night. WOUND #1: - Lower Leg Wound Laterality: Right, Lateral Cleanser: Soap and Water 1 x Per Day/15 Days Discharge Instructions: May shower and wash wound with dial antibacterial soap and water prior to dressing change. Cleanser: Vashe 5.8 (oz) 1 x Per Day/15 Days Discharge Instructions: Cleanse the wound with Vashe prior to applying [name] clean dressing using gauze sponges, not tissue or cotton balls. Prim Dressing: Hydrofera Blue Ready Transfer Foam, 2.5x2.5 (in/in) 1 x Per Day/15 Days ary Discharge Instructions: Apply directly to wound bed as directed Prim Dressing: MediHoney Gel, tube 1.5 (oz) 1 x Per Day/15 Days ary Discharge Instructions: Apply to wound bed as instructed Secondary Dressing: Zetuvit Plus Silicone Border Dressing 4x4 (in/in) (Generic) 1 x Per Day/15 Days Discharge Instructions: Apply silicone border over primary dressing as directed. Secondary Dressing: tubigrip size D 1 x Per Day/15 Days DEVONA, SNEIDER [name] ([id number]) [id number] Page 8 of 10 Discharge Instructions: apply in the morning and remove at night. WOUND #2: - Lower Leg Wound Laterality: Right, Posterior Cleanser: Soap and Water 1 x Per Day/15 Days Discharge Instructions: May shower and wash wound with dial antibacterial soap and water prior to dressing change. Peri-Wound Care: Skin Prep (Generic) 1 x Per Day/15 Days Discharge Instructions: Use skin prep as directed Prim Dressing: Hydrofera Blue Ready Transfer Foam, 2.5x2.5 (in/in) 1 x Per Day/15 Days ary Discharge Instructions: Apply directly to wound bed as directed Prim Dressing: MediHoney Gel, tube 1.5 (oz) 1 x Per Day/15 Days ary Discharge Instructions: Apply to wound bed as instructed Secondary Dressing: Zetuvit Plus Silicone Border Dressing 4x4 (in/in) (Generic) 1 x Per Day/15 Days Discharge Instructions: Apply silicone border over primary  dressing as directed. Secondary Dressing: tubigrip size D 1 x Per Day/15 Days Discharge Instructions: apply in the morning and remove at night. 1. In office sharp debridement 2. Medihoney and Hydrofera Blue 3. Tubigrip daily 4. Follow-up in 1 week Electronic Signature(s) Signed: 01/31/2023 4:28:07 PM By: Geralyn Corwin DO Entered By: Geralyn Corwin on 01/31/2023 13:27:37 -------------------------------------------------------------------------------- HxROS Details Patient Name: Date of Service: [name], [name] Borrow [name] 01/31/2023 3:45 PM Medical Record Number: [id number] Patient Account Number: [id number] Date of Birth/Sex: Treating RN: 1975/09/13 (47 y.o. F) Primary Care Provider: Felix Pacini Other Clinician: Referring Provider: Treating Provider/Extender: Lajuana Carry Weeks in Treatment: 2 Information Obtained From Patient Constitutional Symptoms (General Health) Medical History: Past Medical History Notes: Angular cheilitis chicken pox thyroid disease- hashimoto's thyroiditis Eyes Medical History: Negative for: Cataracts; Glaucoma; Optic Neuritis Ear/Nose/Mouth/Throat Medical History: Negative for: Chronic sinus problems/congestion; Middle ear problems Hematologic/Lymphatic Medical History: Negative for: Anemia; Hemophilia; Human Immunodeficiency Virus; Lymphedema; Sickle Cell Disease Respiratory Medical History: Positive for: Sleep Apnea Negative for: Aspiration; Asthma; Chronic Obstructive Pulmonary Disease (COPD); Pneumothorax; Tuberculosis Cardiovascular Medical History: Negative for: Angina; Arrhythmia; Congestive Heart Failure; Coronary Artery Disease; Deep Vein Thrombosis; Hypertension; Hypotension; Myocardial NAJIAH, CUN [name] ([id number]) [id number] Page 9 of 10 Infarction; Peripheral Arterial Disease; Peripheral Venous Disease; Phlebitis; Vasculitis Gastrointestinal Medical History: Negative for: Cirrhosis ;  Colitis; Crohns; Hepatitis [name]; Hepatitis B; Hepatitis C Genitourinary Medical History: Negative for: End Stage Renal Disease Immunological Medical History: Negative for: Lupus Erythematosus; Raynauds; Scleroderma Integumentary (Skin) Medical History: Negative for: History of Burn Musculoskeletal Medical History: Negative for: Gout; Rheumatoid Arthritis; Osteoarthritis; Osteomyelitis Past Medical History Notes: arthritis in back Neurologic Medical History: Negative for: Dementia; Neuropathy; Quadriplegia; Paraplegia; Seizure Disorder Oncologic Medical History: Negative for: Received Chemotherapy; Received Radiation Psychiatric Medical History: Positive for: Confinement Anxiety Negative for:  Anorexia/bulimia Past Medical History Notes: ADD depression insomnia Immunizations Pneumococcal Vaccine: Received Pneumococcal Vaccination: No Implantable Devices Yes Hospitalization / Surgery History Type of Hospitalization/Surgery DandC 2018 C- section 2004 2007 cervical decom/discectomy fusion Family and Social History Cancer: Yes - Siblings,Maternal Grandparents; Diabetes: Yes - Maternal Grandparents; Heart Disease: Yes - Paternal Grandparents; Hereditary Spherocytosis: No; Hypertension: Yes - Mother; Kidney Disease: No; Lung Disease: No; Seizures: No; Stroke: Yes - Father; Thyroid Problems: Yes - Maternal Grandparents,Mother; Tuberculosis: No; Never smoker; Marital Status - Divorced; Alcohol Use: Never; Drug Use: No History; Caffeine Use: Daily; Financial Concerns: No; Food, Clothing or Shelter Needs: No; Support System Lacking: No; Transportation Concerns: No Electronic Signature(s) Signed: 01/31/2023 4:28:07 PM By: Geralyn Corwin DO Entered By: Geralyn Corwin on 01/31/2023 13:25:44 Vadala, Ajiah [name] ([id number]) [id number] Page 10 of 10 -------------------------------------------------------------------------------- SuperBill Details Patient  Name: Date of Service: Tillman Abide 01/31/2023 Medical Record Number: [id number] Patient Account Number: [id number] Date of Birth/Sex: Treating RN: 1975-11-22 (47 y.o. 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 Facility Procedures : CPT4 Code: [id number] Description: 11042 - DEB SUBQ TISSUE 20 SQ CM/< ICD-10 Diagnosis Description L97.812 Non-pressure chronic ulcer of other part of right lower leg with fat layer exp W54.0XXA Bitten by dog, initial encounter Modifier: osed Quantity: 1 Physician Procedures : CPT4 Code Description Modifier [id number] 11042 - WC PHYS SUBQ TISS 20 SQ CM ICD-10 Diagnosis Description L97.812 Non-pressure chronic ulcer of other part of right lower leg with fat layer exposed W54.0XXA Bitten by dog, initial encounter Quantity: 1 Electronic Signature(s) Signed: 01/31/2023 4:28:07 PM By: Geralyn Corwin DO Entered By: Geralyn Corwin on 01/31/2023 13:27:50

## 2023-02-02 ENCOUNTER — Encounter: Payer: 59 | Admitting: Family Medicine

## 2023-02-02 NOTE — Progress Notes (Signed)
[name], GAVIRIA [name] ([id number]) [id number] Page 1 of 8 Visit Report for 01/31/2023 Arrival Information Details Patient Name: Date of Service: [name] [facility] [name] 01/31/2023 3:45 PM Medical Record Number: [id number] Patient Account Number: [id number] Date of Birth/Sex: Treating RN: [birth year] (47 y.o. F) Primary Care Avabella Wailes: Felix Pacini Other Clinician: Referring Park Beck: Treating Atiyana Welte/Extender: Genene Churn, Renee Weeks in Treatment: 2 Visit Information History Since Last Visit Added or deleted any medications: No Patient Arrived: Ambulatory Any new allergies or adverse reactions: No Arrival Time: 16:04 Had [name] fall or experienced change in No Accompanied By: self activities of daily living that may affect Transfer Assistance: None risk of falls: Patient Identification Verified: Yes Signs or symptoms of abuse/neglect since last visito No Secondary Verification Process Completed: Yes Hospitalized since last visit: No Patient Requires Transmission-Based Precautions: No Implantable device outside of the clinic excluding No Patient Has Alerts: Yes cellular tissue based products placed in the center Patient Alerts: Patient on Blood Thinner since last visit: Has Dressing in Place as Prescribed: Yes Pain Present Now: No Electronic Signature(s) Signed: 02/02/2023 5:02:38 PM By: Thayer Dallas Entered By: Thayer Dallas on 01/31/2023 16:05:48 -------------------------------------------------------------------------------- Lower Extremity Assessment Details Patient Name: Date of Service: [name] 01/31/2023 3:45 PM Medical Record Number: [id number] Patient Account Number: [id number] Date of Birth/Sex: Treating RN: 05-Apr-1976 (47 y.o. F) Primary Care Twanisha Foulk: Felix Pacini Other Clinician: Referring Shalea Tomczak: Treating Iisha Soyars/Extender: Genene Churn, Renee Weeks in Treatment: 2 Edema Assessment Assessed: [Left: No]  [Right: No] [Left: Edema] [Right: :] Calf Left: Right: Point of Measurement: 35.5 cm From Medial Instep 40 cm Ankle Left: Right: Point of Measurement: 9 cm From Medial Instep 22 cm Vascular Assessment Extremity colors, hair growth, and conditions: Extremity Color: [Right:Normal] Hair Growth on Extremity: [Right:No] Temperature of Extremity: [Right:Warm] [name] ([id number]) [Right:[id number] Page 2 of 8] Capillary Refill: [Right:< 3 seconds] Dependent Rubor: [Right:No No] Electronic Signature(s) Signed: 02/02/2023 5:02:38 PM By: Thayer Dallas Entered By: Thayer Dallas on 01/31/2023 16:10:18 -------------------------------------------------------------------------------- Multi Wound Chart Details Patient Name: Date of Service: [name], [name] 01/31/2023 3:45 PM Medical Record Number: [id number] Patient Account Number: [id number] Date of Birth/Sex: Treating RN: March 05, 1976 (47 y.o. F) Primary Care Ikenna Ohms: Felix Pacini Other Clinician: Referring Taisei Bonnette: Treating Nasiah Polinsky/Extender: Genene Churn, Renee Weeks in Treatment: 2 Vital Signs Height(in): 59 Pulse(bpm): 101 Weight(lbs): 218 Blood Pressure(mmHg): 118/83 Body Mass Index(BMI): 44 Temperature(F): 98.3 Respiratory Rate(breaths/min): 18 [1:Photos:] [N/[name]:N/[name]] Right, Lateral Lower Leg Right, Posterior Lower Leg N/[name] Wound Location: Bite Bite N/[name] Wounding Event: Trauma, Other Trauma, Other N/[name] Primary Etiology: Sleep Apnea, Confinement Anxiety Sleep Apnea, Confinement Anxiety N/[name] Comorbid History: 12/16/2022 12/16/2022 N/[name] Date Acquired: 2 2 N/[name] Weeks of Treatment: Open Open N/[name] Wound Status: No No N/[name] Wound Recurrence: 0.2x1x0.3 0.4x2.5x0.2 N/[name] Measurements L x W x D (cm) 0.157 0.785 N/[name] [name] (cm) : rea 0.047 0.157 N/[name] Volume (cm) : 91.70% 60.00% N/[name] % Reduction in [name] rea: 91.70% 60.10% N/[name] % Reduction in Volume: Full Thickness Without Exposed Full Thickness  Without Exposed N/[name] Classification: Support Structures Support Structures Medium Medium N/[name] Exudate [name] mount: Serosanguineous Serosanguineous N/[name] Exudate Type: red, brown red, brown N/[name] Exudate Color: Distinct, outline attached Distinct, outline attached N/[name] Wound Margin: Medium (34-66%) Medium (34-66%) N/[name] Granulation [name] mount: Red Red N/[name] Granulation Quality: Medium (34-66%) Medium (34-66%) N/[name] Necrotic [name] mount: Fat Layer (Subcutaneous Tissue): Yes Fat Layer (Subcutaneous Tissue): Yes N/[name] Exposed Structures: Fascia: No Fascia: No Tendon: No Tendon: No Muscle: No Muscle:  No Joint: No Joint: No Bone: No Bone: No Small (1-33%) Medium (34-66%) N/[name] Epithelialization: Debridement - Excisional Debridement - Excisional N/[name] Debridement: Pre-procedure Verification/Time Out 16:18 16:18 N/[name] Taken: Lidocaine 4% Topical Solution Lidocaine 4% Topical Solution N/[name] Pain Control: Subcutaneous, Slough Subcutaneous, Slough N/[name] Tissue Debrided: Skin/Subcutaneous Tissue Skin/Subcutaneous Tissue N/[name] LevelLARRA, [name] [name] ([id number]) [id number] Page 3 of 8 0.16 0.79 N/[name] Debridement [name] (sq cm): rea Curette Curette N/[name] Instrument: Minimum Minimum N/[name] Bleeding: Pressure Pressure N/[name] Hemostasis Achieved: 0 0 N/[name] Procedural Pain: 0 0 N/[name] Post Procedural Pain: Procedure was tolerated well Procedure was tolerated well N/[name] Debridement Treatment Response: 0.2x1x0.3 0.4x2.5x0.2 N/[name] Post Debridement Measurements L x W x D (cm) 0.047 0.157 N/[name] Post Debridement Volume: (cm) Excoriation: No Excoriation: No N/[name] Periwound Skin Texture: Induration: No Induration: No Callus: No Callus: No Crepitus: No Crepitus: No Rash: No Rash: No Scarring: No Scarring: No Maceration: No Maceration: No N/[name] Periwound Skin Moisture: Dry/Scaly: No Dry/Scaly: No Atrophie Blanche: No Atrophie Blanche: No N/[name] Periwound Skin Color: Cyanosis: No Cyanosis:  No Ecchymosis: No Ecchymosis: No Erythema: No Erythema: No Hemosiderin Staining: No Hemosiderin Staining: No Mottled: No Mottled: No Pallor: No Pallor: No Rubor: No Rubor: No Debridement Debridement N/[name] Procedures Performed: Treatment Notes Electronic Signature(s) Signed: 01/31/2023 4:28:07 PM By: Geralyn Corwin DO Entered By: Geralyn Corwin on 01/31/2023 16:25:05 -------------------------------------------------------------------------------- Multi-Disciplinary Care Plan Details Patient Name: Date of Service: [name] 01/31/2023 3:45 PM Medical Record Number: [id number] Patient Account Number: [id number] Date of Birth/Sex: Treating RN: 01-Jul-1975 (47 y.o. [name] Primary Care Wilmer Berryhill: Felix Pacini Other Clinician: Referring Shaquandra Galano: Treating Emunah Texidor/Extender: Lajuana Carry Weeks in Treatment: 2 Active Inactive Necrotic Tissue Nursing Diagnoses: Knowledge deficit related to management of necrotic/devitalized tissue Goals: Necrotic/devitalized tissue will be minimized in the wound bed Date Initiated: 01/16/2023 Target Resolution Date: 02/17/2023 Goal Status: Active Patient/caregiver will verbalize understanding of reason and process for debridement of necrotic tissue Date Initiated: 01/16/2023 Target Resolution Date: 02/16/2023 Goal Status: Active Interventions: Assess patient pain level pre-, during and post procedure and prior to discharge Provide education on necrotic tissue and debridement process Treatment Activities: Apply topical anesthetic as ordered : 01/16/2023 Biologic debridement : 01/16/2023 Notes: TYMIRA, VILLALONA [name] ([id number]) [phone].pdf Page 4 of 8 Nutrition Nursing Diagnoses: Potential for alteratiion in Nutrition/Potential for imbalanced nutrition Goals: Patient/caregiver agrees to and verbalizes understanding of need to obtain nutritional consultation Date Initiated: 01/16/2023 Target  Resolution Date: 02/16/2023 Goal Status: Active Interventions: Assess patient nutrition upon admission and as needed per policy Provide education on nutrition Treatment Activities: Patient referred to Primary Care Physician for further nutritional evaluation : 01/16/2023 Notes: Wound/Skin Impairment Nursing Diagnoses: Knowledge deficit related to ulceration/compromised skin integrity Goals: Patient/caregiver will verbalize understanding of skin care regimen Date Initiated: 01/16/2023 Target Resolution Date: 02/17/2023 Goal Status: Active Interventions: Assess patient/caregiver ability to perform ulcer/skin care regimen upon admission and as needed Assess ulceration(s) every visit Provide education on ulcer and skin care Treatment Activities: Skin care regimen initiated : 01/16/2023 Topical wound management initiated : 01/16/2023 Notes: Electronic Signature(s) Signed: 01/31/2023 5:13:11 PM By: Shawn Stall RN, BSN Entered By: Shawn Stall on 01/31/2023 16:25:13 -------------------------------------------------------------------------------- Pain Assessment Details Patient Name: Date of Service: [name] 01/31/2023 3:45 PM Medical Record Number: [id number] Patient Account Number: [id number] Date of Birth/Sex: Treating RN: Jul 22, 1975 (47 y.o. F) Primary Care Quentina Fronek: Felix Pacini Other Clinician: Referring Teanna Elem: Treating Bevin Das/Extender: Genene Churn, Renee Weeks in Treatment: 2 Active Problems Location of Pain  Severity and Description of Pain Patient Has Paino No Site Locations [place], [place] [name] ([id number]) [id number] Page 5 of 8 Pain Management and Medication Current Pain Management: Electronic Signature(s) Signed: 02/02/2023 5:02:38 PM By: Thayer Dallas Entered By: Thayer Dallas on 01/31/2023 16:06:11 -------------------------------------------------------------------------------- Patient/Caregiver Education  Details Patient Name: Date of Service: [name] 8/20/2024andnbsp3:45 PM Medical Record Number: [id number] Patient Account Number: [id number] Date of Birth/Gender: Treating RN: 1976-06-07 (47 y.o. [name] Primary Care Physician: Felix Pacini Other Clinician: Referring Physician: Treating Physician/Extender: Delfin Edis in Treatment: 2 Education Assessment Education Provided To: Patient Education Topics Provided Wound/Skin Impairment: Handouts: Caring for Your Ulcer Methods: Explain/Verbal Responses: Reinforcements needed Electronic Signature(s) Signed: 01/31/2023 5:13:11 PM By: Shawn Stall RN, BSN Entered By: Shawn Stall on 01/31/2023 16:25:24 -------------------------------------------------------------------------------- Wound Assessment Details Patient Name: Date of Service: [name] 01/31/2023 3:45 PM Medical Record Number: [id number] Patient Account Number: [id number] Date of Birth/Sex: Treating RN: 1975/08/21 (47 y.o. F) Primary Care Marvell Tamer: Felix Pacini Other Clinician: Pollie Friar ([id number]) [id number] Page 6 of 8 Referring Anjeli Casad: Treating Josephmichael Lisenbee/Extender: Genene Churn, Renee Weeks in Treatment: 2 Wound Status Wound Number: 1 Primary Etiology: Trauma, Other Wound Location: Right, Lateral Lower Leg Wound Status: Open Wounding Event: Bite Comorbid History: Sleep Apnea, Confinement Anxiety Date Acquired: 12/16/2022 Weeks Of Treatment: 2 Clustered Wound: No Photos Wound Measurements Length: (cm) 0.2 Width: (cm) 1 Depth: (cm) 0.3 Area: (cm) 0.157 Volume: (cm) 0.047 % Reduction in Area: 91.7% % Reduction in Volume: 91.7% Epithelialization: Small (1-33%) Tunneling: No Undermining: No Wound Description Classification: Full Thickness Without Exposed Suppor Wound Margin: Distinct, outline attached Exudate Amount: Medium Exudate Type:  Serosanguineous Exudate Color: red, brown t Structures Foul Odor After Cleansing: No Slough/Fibrino Yes Wound Bed Granulation Amount: Medium (34-66%) Exposed Structure Granulation Quality: Red Fascia Exposed: No Necrotic Amount: Medium (34-66%) Fat Layer (Subcutaneous Tissue) Exposed: Yes Necrotic Quality: Adherent Slough Tendon Exposed: No Muscle Exposed: No Joint Exposed: No Bone Exposed: No Periwound Skin Texture Texture Color No Abnormalities Noted: No No Abnormalities Noted: No Callus: No Atrophie Blanche: No Crepitus: No Cyanosis: No Excoriation: No Ecchymosis: No Induration: No Erythema: No Rash: No Hemosiderin Staining: No Scarring: No Mottled: No Pallor: No Moisture Rubor: No No Abnormalities Noted: No Dry / Scaly: No Maceration: No Electronic Signature(s) Signed: 02/02/2023 5:02:38 PM By: Thayer Dallas Entered By: Thayer Dallas on 01/31/2023 16:14:53 [name] ([id number]) [id number] Page 7 of 8 -------------------------------------------------------------------------------- Wound Assessment Details Patient Name: Date of Service: [name] 01/31/2023 3:45 PM Medical Record Number: [id number] Patient Account Number: [id number] Date of Birth/Sex: Treating RN: [birth year] (47 y.o. F) Primary Care Jenniger Figiel: Felix Pacini Other Clinician: Referring Mitcheal Sweetin: Treating Crista Nuon/Extender: Genene Churn, Renee Weeks in Treatment: 2 Wound Status Wound Number: 2 Primary Etiology: Trauma, Other Wound Location: Right, Posterior Lower Leg Wound Status: Open Wounding Event: Bite Comorbid History: Sleep Apnea, Confinement Anxiety Date Acquired: 12/16/2022 Weeks Of Treatment: 2 Clustered Wound: No Photos Wound Measurements Length: (cm) 0 Width: (cm) 2 Depth: (cm) 0 Area: (cm) Volume: (cm) .4 % Reduction in Area: 60% .5 % Reduction in Volume: 60.1% .2 Epithelialization: Medium (34-66%) 0.785 Tunneling:  No 0.157 Undermining: No Wound Description Classification: Full Thickness Without Exposed Suppor Wound Margin: Distinct, outline attached Exudate Amount: Medium Exudate Type: Serosanguineous Exudate Color: red, brown t Structures Foul Odor After Cleansing: No Slough/Fibrino Yes Wound Bed Granulation Amount: Medium (34-66%) Exposed Structure Granulation Quality: Red Fascia Exposed: No Necrotic Amount:  Medium (34-66%) Fat Layer (Subcutaneous Tissue) Exposed: Yes Necrotic Quality: Adherent Slough Tendon Exposed: No Muscle Exposed: No Joint Exposed: No Bone Exposed: No Periwound Skin Texture Texture Color No Abnormalities Noted: No No Abnormalities Noted: No Callus: No Atrophie Blanche: No Crepitus: No Cyanosis: No Excoriation: No Ecchymosis: No Induration: No Erythema: No Rash: No Hemosiderin Staining: No Scarring: No Mottled: No Pallor: No Moisture Rubor: No No Abnormalities Noted: No Dry / Scaly: No Maceration: No [name], [name] ([id number]) [id number] Page 8 of 8 Electronic Signature(s) Signed: 02/02/2023 5:02:38 PM By: Thayer Dallas Entered By: Thayer Dallas on 01/31/2023 16:15:55 -------------------------------------------------------------------------------- Vitals Details Patient Name: Date of Service: [name], [name] 01/31/2023 3:45 PM Medical Record Number: [id number] Patient Account Number: [id number] Date of Birth/Sex: Treating RN: 1975-08-15 (47 y.o. F) Primary Care Griffey Nicasio: Felix Pacini Other Clinician: Referring Brileigh Sevcik: Treating Cheetara Hoge/Extender: Genene Churn, Renee Weeks in Treatment: 2 Vital Signs Time Taken: 16:17 Temperature (F): 98.3 Height (in): 59 Pulse (bpm): 101 Weight (lbs): 218 Respiratory Rate (breaths/min): 18 Body Mass Index (BMI): 44 Blood Pressure (mmHg): 118/83 Reference Range: 80 - 120 mg / dl Electronic Signature(s) Signed: 02/02/2023 5:02:38 PM By: Thayer Dallas Entered  By: Thayer Dallas on 01/31/2023 16:19:10

## 2023-02-07 ENCOUNTER — Encounter (HOSPITAL_BASED_OUTPATIENT_CLINIC_OR_DEPARTMENT_OTHER): Payer: 59 | Admitting: Internal Medicine

## 2023-02-07 DIAGNOSIS — W540XXA Bitten by dog, initial encounter: Secondary | ICD-10-CM

## 2023-02-07 DIAGNOSIS — L97812 Non-pressure chronic ulcer of other part of right lower leg with fat layer exposed: Secondary | ICD-10-CM | POA: Diagnosis not present

## 2023-02-07 NOTE — Progress Notes (Signed)
MEGEN, SHECKLER A ([id number]) [id number] Page 1 of 8 Visit Report for 02/07/2023 Arrival Information Details Patient Name: Date of Service: [name] [facility] A. 02/07/2023 3:45 PM Medical Record Number: [id number] Patient Account Number: [id number] Date of Birth/Sex: Treating RN: June 03, 1976 (47 y.o. Gevena Mart Primary Care Lunden Stieber: Felix Pacini Other Clinician: Referring Oneda Duffett: Treating Isiaih Hollenbach/Extender: Lajuana Carry Weeks in Treatment: 3 Visit Information History Since Last Visit All ordered tests and consults were completed: Yes Patient Arrived: Ambulatory Added or deleted any medications: No Arrival Time: 15:41 Any new allergies or adverse reactions: No Accompanied By: self Had a fall or experienced change in No Transfer Assistance: None activities of daily living that may affect Patient Identification Verified: Yes risk of falls: Secondary Verification Process Completed: Yes Signs or symptoms of abuse/neglect since last visito No Patient Requires Transmission-Based Precautions: No Hospitalized since last visit: No Patient Has Alerts: Yes Implantable device outside of the clinic excluding No Patient Alerts: Patient on Blood Thinner cellular tissue based products placed in the center since last visit: Has Dressing in Place as Prescribed: Yes Pain Present Now: No Electronic Signature(s) Signed: 02/07/2023 4:26:23 PM By: Brenton Grills Entered By: Brenton Grills on 02/07/2023 15:42:10 -------------------------------------------------------------------------------- Lower Extremity Assessment Details Patient Name: Date of Service: [name] A. 02/07/2023 3:45 PM Medical Record Number: [id number] Patient Account Number: [id number] Date of Birth/Sex: Treating RN: 1975-07-23 (47 y.o. Gevena Mart Primary Care Aryssa Rosamond: Felix Pacini Other Clinician: Referring Yisroel Mullendore: Treating Yajahira Tison/Extender: Genene Churn, Renee Weeks in Treatment: 3 Edema Assessment Assessed: [Left: No] [Right: No] [Left: Edema] [Right: :] Calf Left: Right: Point of Measurement: 35.5 cm From Medial Instep 40 cm Ankle Left: Right: Point of Measurement: 9 cm From Medial Instep 22 cm Vascular Assessment Pulses: Dorsalis Pedis Palpable: [Right:Yes] Janes, Nylani A ([id number]) [Right:[id number] Page 2 of 8] Extremity colors, hair growth, and conditions: Extremity Color: [Right:Normal] Hair Growth on Extremity: [Right:No] Temperature of Extremity: [Right:Warm] Capillary Refill: [Right:< 3 seconds] Dependent Rubor: [Right:No No] Toe Nail Assessment Left: Right: Thick: No Discolored: No Deformed: No Improper Length and Hygiene: No Electronic Signature(s) Signed: 02/07/2023 4:26:23 PM By: Brenton Grills Entered By: Brenton Grills on 02/07/2023 15:47:06 -------------------------------------------------------------------------------- Multi Wound Chart Details Patient Name: Date of Service: [name], [name] A. 02/07/2023 3:45 PM Medical Record Number: [id number] Patient Account Number: [id number] Date of Birth/Sex: Treating RN: 1976-03-12 (47 y.o. F) Primary Care Alfonza Toft: Felix Pacini Other Clinician: Referring Icel Castles: Treating Becker Christopher/Extender: Genene Churn, Renee Weeks in Treatment: 3 Vital Signs Height(in): 59 Pulse(bpm): 91 Weight(lbs): 218 Blood Pressure(mmHg): 115/79 Body Mass Index(BMI): 44 Temperature(F): 98.4 Respiratory Rate(breaths/min): 18 [1:Photos:] [N/A:N/A] Right, Lateral Lower Leg Right, Posterior Lower Leg N/A Wound Location: Bite Bite N/A Wounding Event: Trauma, Other Trauma, Other N/A Primary Etiology: Sleep Apnea, Confinement Anxiety Sleep Apnea, Confinement Anxiety N/A Comorbid History: 12/16/2022 12/16/2022 N/A Date Acquired: 3 3 N/A Weeks of Treatment: Open Open N/A Wound Status: No No N/A Wound Recurrence: 0.2x0.9x0.2  0.4x2.3x0.2 N/A Measurements L x W x D (cm) 0.141 0.723 N/A A (cm) : rea 0.028 0.145 N/A Volume (cm) : 92.50% 63.20% N/A % Reduction in A rea: 95.00% 63.10% N/A % Reduction in Volume: Full Thickness Without Exposed Full Thickness Without Exposed N/A Classification: Support Structures Support Structures Medium Medium N/A Exudate Amount: Serosanguineous Serosanguineous N/A Exudate Type: red, brown red, brown N/A Exudate Color: Distinct, outline attached Distinct, outline attached N/A Wound Margin: Medium (34-66%) Medium (34-66%) N/A Granulation Amount: Red Red N/A Granulation Quality: Medium (  34-66%) Medium (34-66%) N/A Necrotic Amount: LIDIA, DONALDS A ([id number]) [id number] Page 3 of 8 Fat Layer (Subcutaneous Tissue): Yes Fat Layer (Subcutaneous Tissue): Yes N/A Exposed Structures: Fascia: No Fascia: No Tendon: No Tendon: No Muscle: No Muscle: No Joint: No Joint: No Bone: No Bone: No Small (1-33%) Medium (34-66%) N/A Epithelialization: Debridement - Selective/Open Wound Debridement - Selective/Open Wound N/A Debridement: Pre-procedure Verification/Time Out 16:06 16:06 N/A Taken: Lidocaine 4% Topical Solution Lidocaine 4% Topical Solution N/A Pain Control: [facility] N/A Tissue Debrided: Non-Viable Tissue Non-Viable Tissue N/A Level: 0.14 0.72 N/A Debridement A (sq cm): rea Curette Curette N/A Instrument: Minimum Minimum N/A Bleeding: Pressure Pressure N/A Hemostasis A chieved: Procedure was tolerated well Procedure was tolerated well N/A Debridement Treatment Response: 0.2x0.9x0.2 0.4x2.3x0.2 N/A Post Debridement Measurements L x W x D (cm) 0.028 0.145 N/A Post Debridement Volume: (cm) Excoriation: No Excoriation: No N/A Periwound Skin Texture: Induration: No Induration: No Callus: No Callus: No Crepitus: No Crepitus: No Rash: No Rash: No Scarring: No Scarring: No Maceration: No Maceration: No  N/A Periwound Skin Moisture: Dry/Scaly: No Dry/Scaly: No Atrophie Blanche: No Atrophie Blanche: No N/A Periwound Skin Color: Cyanosis: No Cyanosis: No Ecchymosis: No Ecchymosis: No Erythema: No Erythema: No Hemosiderin Staining: No Hemosiderin Staining: No Mottled: No Mottled: No Pallor: No Pallor: No Rubor: No Rubor: No Debridement Debridement N/A Procedures Performed: Treatment Notes Electronic Signature(s) Signed: 02/07/2023 4:43:58 PM By: Geralyn Corwin DO Entered By: Geralyn Corwin on 02/07/2023 16:40:59 -------------------------------------------------------------------------------- Multi-Disciplinary Care Plan Details Patient Name: Date of Service: [name], [name] A. 02/07/2023 3:45 PM Medical Record Number: [id number] Patient Account Number: [id number] Date of Birth/Sex: Treating RN: 20-Jun-1975 (47 y.o. Gevena Mart Primary Care Hagen Bohorquez: Felix Pacini Other Clinician: Referring Joal Eakle: Treating Laurena Valko/Extender: Lajuana Carry Weeks in Treatment: 3 Active Inactive Necrotic Tissue Nursing Diagnoses: Knowledge deficit related to management of necrotic/devitalized tissue Goals: Necrotic/devitalized tissue will be minimized in the wound bed Date Initiated: 01/16/2023 Target Resolution Date: 02/17/2023 Goal Status: Active Patient/caregiver will verbalize understanding of reason and process for debridement of necrotic tissue Date Initiated: 01/16/2023 Target Resolution Date: 02/16/2023 Goal Status: Active Interventions: CAYLEEN, YOUN A ([id number]) [phone].pdf Page 4 of 8 Assess patient pain level pre-, during and post procedure and prior to discharge Provide education on necrotic tissue and debridement process Treatment Activities: Apply topical anesthetic as ordered : 01/16/2023 Biologic debridement : 01/16/2023 Notes: Nutrition Nursing Diagnoses: Potential for alteratiion in Nutrition/Potential for imbalanced  nutrition Goals: Patient/caregiver agrees to and verbalizes understanding of need to obtain nutritional consultation Date Initiated: 01/16/2023 Target Resolution Date: 02/16/2023 Goal Status: Active Interventions: Assess patient nutrition upon admission and as needed per policy Provide education on nutrition Treatment Activities: Patient referred to Primary Care Physician for further nutritional evaluation : 01/16/2023 Notes: Wound/Skin Impairment Nursing Diagnoses: Knowledge deficit related to ulceration/compromised skin integrity Goals: Patient/caregiver will verbalize understanding of skin care regimen Date Initiated: 01/16/2023 Target Resolution Date: 02/17/2023 Goal Status: Active Interventions: Assess patient/caregiver ability to perform ulcer/skin care regimen upon admission and as needed Assess ulceration(s) every visit Provide education on ulcer and skin care Treatment Activities: Skin care regimen initiated : 01/16/2023 Topical wound management initiated : 01/16/2023 Notes: Electronic Signature(s) Signed: 02/07/2023 4:26:23 PM By: Brenton Grills Entered By: Brenton Grills on 02/07/2023 15:53:58 -------------------------------------------------------------------------------- Pain Assessment Details Patient Name: Date of Service: [name] A. 02/07/2023 3:45 PM Medical Record Number: [id number] Patient Account Number: [id number] Date of Birth/Sex: Treating RN: 1975/06/22 (47 y.o. Gevena Mart Primary Care Aryeh Butterfield: Claiborne Billings,  Renee Other Clinician: Referring Allin Frix: Treating Dirk Vanaman/Extender: Lajuana Carry Weeks in Treatment: 3 Active Problems Location of Pain Severity and Description of Pain Patient Has Paino No Site Locations [place], [place] A ([id number]) [id number] Page 5 of 8 Pain Management and Medication Current Pain Management: Electronic Signature(s) Signed: 02/07/2023 4:26:23 PM By: Brenton Grills Entered By:  Brenton Grills on 02/07/2023 15:46:49 -------------------------------------------------------------------------------- Patient/Caregiver Education Details Patient Name: Date of Service: Tillman Abide 8/27/2024andnbsp3:45 PM Medical Record Number: [id number] Patient Account Number: [id number] Date of Birth/Gender: Treating RN: Jan 12, 1976 (47 y.o. Gevena Mart Primary Care Physician: Felix Pacini Other Clinician: Referring Physician: Treating Physician/Extender: Delfin Edis in Treatment: 3 Education Assessment Education Provided To: Patient Education Topics Provided Wound/Skin Impairment: Methods: Explain/Verbal Responses: State content correctly Electronic Signature(s) Signed: 02/07/2023 4:26:23 PM By: Brenton Grills Entered By: Brenton Grills on 02/07/2023 15:54:17 -------------------------------------------------------------------------------- Wound Assessment Details Patient Name: Date of Service: [name] A. 02/07/2023 3:45 PM Medical Record Number: [id number] Patient Account Number: [id number] Date of Birth/Sex: Treating RN: [birth year] (47 y.o. Gevena Mart Primary Care Avyana Puffenbarger: Felix Pacini Other Clinician: Referring Audrick Lamoureaux: Treating Farryn Linares/Extender: Lajuana Carry Clinton, Samara Deist A ([id number]) [id number] Page 6 of 8 Weeks in Treatment: 3 Wound Status Wound Number: 1 Primary Etiology: Trauma, Other Wound Location: Right, Lateral Lower Leg Wound Status: Open Wounding Event: Bite Comorbid History: Sleep Apnea, Confinement Anxiety Date Acquired: 12/16/2022 Weeks Of Treatment: 3 Clustered Wound: No Photos Wound Measurements Length: (cm) 0.2 Width: (cm) 0.9 Depth: (cm) 0.2 Area: (cm) 0.141 Volume: (cm) 0.028 % Reduction in Area: 92.5% % Reduction in Volume: 95% Epithelialization: Small (1-33%) Tunneling: No Undermining: No Wound Description Classification: Full  Thickness Without Exposed Suppor Wound Margin: Distinct, outline attached Exudate Amount: Medium Exudate Type: Serosanguineous Exudate Color: red, brown t Structures Foul Odor After Cleansing: No Slough/Fibrino Yes Wound Bed Granulation Amount: Medium (34-66%) Exposed Structure Granulation Quality: Red Fascia Exposed: No Necrotic Amount: Medium (34-66%) Fat Layer (Subcutaneous Tissue) Exposed: Yes Necrotic Quality: Adherent Slough Tendon Exposed: No Muscle Exposed: No Joint Exposed: No Bone Exposed: No Periwound Skin Texture Texture Color No Abnormalities Noted: No No Abnormalities Noted: No Callus: No Atrophie Blanche: No Crepitus: No Cyanosis: No Excoriation: No Ecchymosis: No Induration: No Erythema: No Rash: No Hemosiderin Staining: No Scarring: No Mottled: No Pallor: No Moisture Rubor: No No Abnormalities Noted: No Dry / Scaly: No Maceration: No Electronic Signature(s) Signed: 02/07/2023 4:26:23 PM By: Brenton Grills Entered By: Brenton Grills on 02/07/2023 15:51:34 Melton Krebs A ([id number]) [id number] Page 7 of 8 -------------------------------------------------------------------------------- Wound Assessment Details Patient Name: Date of Service: [name] [facility] A. 02/07/2023 3:45 PM Medical Record Number: [id number] Patient Account Number: [id number] Date of Birth/Sex: Treating RN: 1976/03/11 (47 y.o. Gevena Mart Primary Care Leobardo Granlund: Felix Pacini Other Clinician: Referring Maisy Newport: Treating Sinclaire Artiga/Extender: Lajuana Carry Weeks in Treatment: 3 Wound Status Wound Number: 2 Primary Etiology: Trauma, Other Wound Location: Right, Posterior Lower Leg Wound Status: Open Wounding Event: Bite Comorbid History: Sleep Apnea, Confinement Anxiety Date Acquired: 12/16/2022 Weeks Of Treatment: 3 Clustered Wound: No Photos Wound Measurements Length: (cm) 0.4 Width: (cm) 2.3 Depth: (cm) 0.2 Area: (cm)  0.723 Volume: (cm) 0.145 % Reduction in Area: 63.2% % Reduction in Volume: 63.1% Epithelialization: Medium (34-66%) Tunneling: No Undermining: No Wound Description Classification: Full Thickness Without Exposed Suppor Wound Margin: Distinct, outline attached Exudate Amount: Medium Exudate Type: Serosanguineous Exudate Color: red, brown t Structures Foul Odor After Cleansing: No Slough/Fibrino Yes Wound  Bed Granulation Amount: Medium (34-66%) Exposed Structure Granulation Quality: Red Fascia Exposed: No Necrotic Amount: Medium (34-66%) Fat Layer (Subcutaneous Tissue) Exposed: Yes Necrotic Quality: Adherent Slough Tendon Exposed: No Muscle Exposed: No Joint Exposed: No Bone Exposed: No Periwound Skin Texture Texture Color No Abnormalities Noted: No No Abnormalities Noted: No Callus: No Atrophie Blanche: No Crepitus: No Cyanosis: No Excoriation: No Ecchymosis: No Induration: No Erythema: No Rash: No Hemosiderin Staining: No Scarring: No Mottled: No Pallor: No Moisture Rubor: No No Abnormalities Noted: No Dry / Scaly: No Maceration: No Electronic Signature(s) Signed: 02/07/2023 4:26:23 PM By: Brenton Grills Entered By: Brenton Grills on 02/07/2023 15:52:16 Melton Krebs A ([id number]) [id number] Page 8 of 8 -------------------------------------------------------------------------------- Vitals Details Patient Name: Date of Service: [name] [facility] A. 02/07/2023 3:45 PM Medical Record Number: [id number] Patient Account Number: [id number] Date of Birth/Sex: Treating RN: 27-Jun-1975 (47 y.o. Gevena Mart Primary Care Osiah Haring: Felix Pacini Other Clinician: Referring Marit Goodwill: Treating Johniece Hornbaker/Extender: Lajuana Carry Weeks in Treatment: 3 Vital Signs Time Taken: 15:43 Temperature (F): 98.4 Height (in): 59 Pulse (bpm): 91 Weight (lbs): 218 Respiratory Rate (breaths/min): 18 Body Mass Index (BMI): 44 Blood  Pressure (mmHg): 115/79 Reference Range: 80 - 120 mg / dl Electronic Signature(s) Signed: 02/07/2023 4:26:23 PM By: Brenton Grills Entered By: Brenton Grills on 02/07/2023 15:44:40

## 2023-02-07 NOTE — Progress Notes (Signed)
LODELL, MAGNIN [name] ([id number]) [id number] Page 1 of 10 Visit Report for 02/07/2023 Chief Complaint Document Details Patient Name: Date of Service: [name] [name] [facility] [name] 02/07/2023 3:45 PM Medical Record Number: [id number] Patient Account Number: [id number] Date of Birth/Sex: Treating RN: 11-Apr-1976 (47 y.o. F) Primary Care Provider: Felix Pacini Other Clinician: Referring Provider: Treating Provider/Extender: Genene Churn, Renee Weeks in Treatment: 3 Information Obtained from: Patient Chief Complaint 01/16/2023; right lower extremity wound from [name] dog bite Electronic Signature(s) Signed: 02/07/2023 4:43:58 PM By: Geralyn Corwin DO Entered By: Geralyn Corwin on 02/07/2023 16:41:10 -------------------------------------------------------------------------------- Debridement Details Patient Name: Date of Service: [name] [name], [name] Borrow [name] 02/07/2023 3:45 PM Medical Record Number: [id number] Patient Account Number: [id number] Date of Birth/Sex: Treating RN: 1976-03-31 (47 y.o. [name] [name] Primary Care Provider: Felix Pacini Other Clinician: Referring Provider: Treating Provider/Extender: Lajuana Carry Weeks in Treatment: 3 Debridement Performed for Assessment: Wound #1 Right,Lateral Lower Leg Performed By: Physician Geralyn Corwin, DO Debridement Type: Debridement Level of Consciousness (Pre-procedure): Awake and Alert Pre-procedure Verification/Time Out Yes - 16:06 Taken: Start Time: 16:07 Pain Control: Lidocaine 4% T opical Solution Percent of Wound Bed Debrided: 100% T Area Debrided (cm): otal 0.14 Tissue and other material debrided: Non-Viable, Slough, Slough Level: Non-Viable Tissue Debridement Description: Selective/Open Wound Instrument: Curette Bleeding: Minimum Hemostasis Achieved: Pressure Response to Treatment: Procedure was tolerated well Level of Consciousness (Post- Awake and Alert procedure): Post  Debridement Measurements of Total Wound Length: (cm) 0.2 Width: (cm) 0.9 Depth: (cm) 0.2 Volume: (cm) 0.028 Character of Wound/Ulcer Post Debridement: Improved Post Procedure Diagnosis Same as Pre-procedure ANYKA, STIKA [name] ([id number]) [id number] Page 2 of 10 Notes Scribed for Dr Mikey Bussing by Brenton Grills RN Electronic Signature(s) Signed: 02/07/2023 4:26:23 PM By: Brenton Grills Signed: 02/07/2023 4:43:58 PM By: Geralyn Corwin DO Entered By: Brenton Grills on 02/07/2023 16:07:37 -------------------------------------------------------------------------------- Debridement Details Patient Name: Date of Service: [name] [name], [name] Borrow [name] 02/07/2023 3:45 PM Medical Record Number: [id number] Patient Account Number: [id number] Date of Birth/Sex: Treating RN: [birth year] (47 y.o. [name] [name] Primary Care Provider: Felix Pacini Other Clinician: Referring Provider: Treating Provider/Extender: Lajuana Carry Weeks in Treatment: 3 Debridement Performed for Assessment: Wound #2 Right,Posterior Lower Leg Performed By: Physician Geralyn Corwin, DO Debridement Type: Debridement Level of Consciousness (Pre-procedure): Awake and Alert Pre-procedure Verification/Time Out Yes - 16:06 Taken: Start Time: 16:07 Pain Control: Lidocaine 4% T opical Solution Percent of Wound Bed Debrided: 100% T Area Debrided (cm): otal 0.72 Tissue and other material debrided: Non-Viable, Slough, Slough Level: Non-Viable Tissue Debridement Description: Selective/Open Wound Instrument: Curette Bleeding: Minimum Hemostasis Achieved: Pressure Response to Treatment: Procedure was tolerated well Level of Consciousness (Post- Awake and Alert procedure): Post Debridement Measurements of Total Wound Length: (cm) 0.4 Width: (cm) 2.3 Depth: (cm) 0.2 Volume: (cm) 0.145 Character of Wound/Ulcer Post Debridement: Improved Post Procedure Diagnosis Same as  Pre-procedure Notes Scribed for Dr Mikey Bussing by Brenton Grills RN Electronic Signature(s) Signed: 02/07/2023 4:26:23 PM By: Brenton Grills Signed: 02/07/2023 4:43:58 PM By: Geralyn Corwin DO Entered By: Brenton Grills on 02/07/2023 16:08:22 -------------------------------------------------------------------------------- HPI Details Patient Name: Date of Service: [name] [name], [name] Borrow [name] 02/07/2023 3:45 PM Medical Record Number: [id number] Patient Account Number: [id number] [name] [name], [name] [name] ([id number]) [id number] Page 3 of 10 Date of Birth/Sex: Treating RN: Nov 07, 1975 (47 y.o. F) Primary Care Provider: Other Clinician: Felix Pacini Referring Provider: Treating Provider/Extender: Genene Churn, Renee Weeks in Treatment: 3 History of Present Illness ssociated Signs and Symptoms: ABI done on 01/16/2023 was 0.9  [name] HPI Description: 01/16/2023 Ms. [name] [name] is [name] with [name] past medical history of Hashimoto's thyroiditis that presents the clinic for [name] 1 month history of nonhealing wound to the right lower extremity. She was bit by [name] dog on 12/16/2022 and visited the ED for this issue. She had stitches placed and was started on oral antibiotics. The wound dehisced and remaining sutures were taken out by the ED. On 7/19 she again revisited the ED for wound check and was started on clindamycin. Patient has completed this course and currently denies signs of infection. She is also been following with her primary care physician for this issue. She has been using bag balm to the wound bed. Her Tdap vaccine is up-to-date and she had [name] rabies vaccine. 8/13; patient presents for follow-up. She has been using Vashe wet-to-dry dressings to the wound beds. The wounds are smaller. She has no issues or complaints today. 8/20; patient presents for follow-up. She has been using Medihoney and Hydrofera Blue to the wound bed. The wounds are smaller. She has no  issues or complaints. 8/27; patient presents for follow-up. She has been using Medihoney and Hydrofera Blue to the wound beds. The wound are smaller. Electronic Signature(s) Signed: 02/07/2023 4:43:58 PM By: Geralyn Corwin DO Entered By: Geralyn Corwin on 02/07/2023 16:41:36 -------------------------------------------------------------------------------- Physical Exam Details Patient Name: Date of Service: [name] Sloop [name] 02/07/2023 3:45 PM Medical Record Number: [id number] Patient Account Number: [id number] Date of Birth/Sex: Treating RN: December 23, 1975 (47 y.o. F) Primary Care Provider: Felix Pacini Other Clinician: Referring Provider: Treating Provider/Extender: Genene Churn, Renee Weeks in Treatment: 3 Constitutional respirations regular, non-labored and within target range for patient.. Cardiovascular 2+ dorsalis pedis/posterior tibialis pulses. Psychiatric pleasant and cooperative. Notes T the right lower extremity posterior aspect there are 2 open wounds with nonviable tissue and granulation tissue to the wound beds. No surrounding signs of o infection including increased warmth, erythema or purulent drainage. Electronic Signature(s) Signed: 02/07/2023 4:43:58 PM By: Geralyn Corwin DO Entered By: Geralyn Corwin on 02/07/2023 16:42:01 -------------------------------------------------------------------------------- Physician Orders Details Patient Name: Date of Service: [name] [name], [name] Borrow [name] 02/07/2023 3:45 PM Medical Record Number: [id number] Patient Account Number: [id number] Date of Birth/Sex: Treating RN: 1976-04-10 (47 y.o. [name] [name] Primary Care Provider: Felix Pacini Other Clinician: MAVA, [name] [name] ([id number]) [id number] Page 4 of 10 Referring Provider: Treating Provider/Extender: Lajuana Carry Weeks in Treatment: 3 Verbal / Phone Orders: No Diagnosis Coding Follow-up Appointments ppointment  in 2 weeks. - Dr. Mikey Bussing room 8 Monday or Tuesday (already scheduled) Return [name] Anesthetic (In clinic) Topical Lidocaine 4% applied to wound bed Bathing/ Shower/ Hygiene May shower and wash wound with soap and water. Edema Control - Lymphedema / SCD / Other Elevate legs to the level of the heart or above for 30 minutes daily and/or when sitting for 3-4 times [name] day throughout the day. Avoid standing for long periods of time. Other Edema Control Orders/Instructions: - tubrigrip size D apply in the morning and remove at night. Wound Treatment Wound #1 - Lower Leg Wound Laterality: Right, Lateral Cleanser: Soap and Water 1 x Per Day/15 Days Discharge Instructions: May shower and wash wound with dial antibacterial soap and water prior to dressing change. Cleanser: Vashe 5.8 (oz) 1 x Per Day/15 Days Discharge Instructions: Cleanse the wound with Vashe prior to applying [name] clean dressing using gauze sponges, not tissue or cotton balls. Prim Dressing: Hydrofera Blue Ready Transfer Foam, 2.5x2.5 (in/in) 1 x Per Day/15  Days ary Discharge Instructions: Apply directly to wound bed as directed Prim Dressing: MediHoney Gel, tube 1.5 (oz) 1 x Per Day/15 Days ary Discharge Instructions: Apply to wound bed as instructed Secondary Dressing: Zetuvit Plus Silicone Border Dressing 4x4 (in/in) (Generic) 1 x Per Day/15 Days Discharge Instructions: Apply silicone border over primary dressing as directed. Secondary Dressing: tubigrip size D 1 x Per Day/15 Days Discharge Instructions: apply in the morning and remove at night. Wound #2 - Lower Leg Wound Laterality: Right, Posterior Cleanser: Soap and Water 1 x Per Day/15 Days Discharge Instructions: May shower and wash wound with dial antibacterial soap and water prior to dressing change. Peri-Wound Care: Skin Prep (Generic) 1 x Per Day/15 Days Discharge Instructions: Use skin prep as directed Prim Dressing: Hydrofera Blue Ready Transfer Foam, 2.5x2.5 (in/in) 1 x  Per Day/15 Days ary Discharge Instructions: Apply directly to wound bed as directed Prim Dressing: MediHoney Gel, tube 1.5 (oz) 1 x Per Day/15 Days ary Discharge Instructions: Apply to wound bed as instructed Secondary Dressing: Zetuvit Plus Silicone Border Dressing 4x4 (in/in) (Generic) 1 x Per Day/15 Days Discharge Instructions: Apply silicone border over primary dressing as directed. Secondary Dressing: tubigrip size D 1 x Per Day/15 Days Discharge Instructions: apply in the morning and remove at night. Electronic Signature(s) Signed: 02/07/2023 4:43:58 PM By: Geralyn Corwin DO Previous Signature: 02/07/2023 4:26:23 PM Version By: Brenton Grills Entered By: Geralyn Corwin on 02/07/2023 16:42:13 Problem List Details -------------------------------------------------------------------------------- [name] [name] ([id number]) [id number] Page 5 of 10 Patient Name: Date of Service: [name] [name] 02/07/2023 3:45 PM Medical Record Number: [id number] Patient Account Number: [id number] Date of Birth/Sex: Treating RN: [birth year] (47 y.o. [name] [name] Primary Care Provider: Felix Pacini Other Clinician: Referring Provider: Treating Provider/Extender: Lajuana Carry Weeks in Treatment: 3 Active Problems ICD-10 Encounter Code Description Active Date MDM Diagnosis [id number] Non-pressure chronic ulcer of other part of right lower leg with fat layer 01/16/2023 No Yes exposed W54.0XXA Bitten by dog, initial encounter 01/16/2023 No Yes E06.3 Autoimmune thyroiditis 01/16/2023 No Yes Inactive Problems Resolved Problems Electronic Signature(s) Signed: 02/07/2023 4:43:58 PM By: Geralyn Corwin DO Previous Signature: 02/07/2023 4:26:23 PM Version By: Brenton Grills Entered By: Geralyn Corwin on 02/07/2023 16:40:53 -------------------------------------------------------------------------------- Progress Note Details Patient Name: Date of  Service: [name] Sloop [name] 02/07/2023 3:45 PM Medical Record Number: [id number] Patient Account Number: [id number] Date of Birth/Sex: Treating RN: August 24, 1975 (47 y.o. F) Primary Care Provider: Felix Pacini Other Clinician: Referring Provider: Treating Provider/Extender: Genene Churn, Renee Weeks in Treatment: 3 Subjective Chief Complaint Information obtained from Patient 01/16/2023; right lower extremity wound from [name] dog bite History of Present Illness (HPI) The following HPI elements were documented for the patient's wound: Associated Signs and Symptoms: ABI done on 01/16/2023 was 0.9 01/16/2023 Ms. Ezell Ayer is [name] with [name] past medical history of Hashimoto's thyroiditis that presents the clinic for [name] 1 month history of nonhealing wound to the right lower extremity. She was bit by [name] dog on 12/16/2022 and visited the ED for this issue. She had stitches placed and was started on oral antibiotics. The wound dehisced and remaining sutures were taken out by the ED. On 7/19 she again revisited the ED for wound check and was started on clindamycin. Patient has completed this course and currently denies signs of infection. She is also been following with her primary care physician for this issue. She has been using bag balm to the wound bed. Her Tdap vaccine  is up-to-date and she had [name] rabies vaccine. 8/13; patient presents for follow-up. She has been using Vashe wet-to-dry dressings to the wound beds. The wounds are smaller. She has no issues or complaints today. 8/20; patient presents for follow-up. She has been using Medihoney and Hydrofera Blue to the wound bed. The wounds are smaller. She has no issues or complaints. 8/27; patient presents for follow-up. She has been using Medihoney and Hydrofera Blue to the wound beds. The wound are smaller. [name] [name], [name] [name] ([id number]) [id number] Page 6 of 10 Patient History Information obtained  from Patient. Family History Cancer - Siblings,Maternal Grandparents, Diabetes - Maternal Grandparents, Heart Disease - Paternal Grandparents, Hypertension - Mother, Stroke - Father, Thyroid Problems - Maternal Grandparents,Mother, No family history of Hereditary Spherocytosis, Kidney Disease, Lung Disease, Seizures, Tuberculosis. Social History Never smoker, Marital Status - Divorced, Alcohol Use - Never, Drug Use - No History, Caffeine Use - Daily. Medical History Eyes Denies history of Cataracts, Glaucoma, Optic Neuritis Ear/Nose/Mouth/Throat Denies history of Chronic sinus problems/congestion, Middle ear problems Hematologic/Lymphatic Denies history of Anemia, Hemophilia, Human Immunodeficiency Virus, Lymphedema, Sickle Cell Disease Respiratory Patient has history of Sleep Apnea Denies history of Aspiration, Asthma, Chronic Obstructive Pulmonary Disease (COPD), Pneumothorax, Tuberculosis Cardiovascular Denies history of Angina, Arrhythmia, Congestive Heart Failure, Coronary Artery Disease, Deep Vein Thrombosis, Hypertension, Hypotension, Myocardial Infarction, Peripheral Arterial Disease, Peripheral Venous Disease, Phlebitis, Vasculitis Gastrointestinal Denies history of Cirrhosis , Colitis, Crohns, Hepatitis [name], Hepatitis B, Hepatitis C Genitourinary Denies history of End Stage Renal Disease Immunological Denies history of Lupus Erythematosus, Raynauds, Scleroderma Integumentary (Skin) Denies history of History of Burn Musculoskeletal Denies history of Gout, Rheumatoid Arthritis, Osteoarthritis, Osteomyelitis Neurologic Denies history of Dementia, Neuropathy, Quadriplegia, Paraplegia, Seizure Disorder Oncologic Denies history of Received Chemotherapy, Received Radiation Psychiatric Patient has history of Confinement Anxiety Denies history of Anorexia/bulimia Hospitalization/Surgery History - [facility] 2018. - C- section 2004 2007. - cervical decom/discectomy fusion. 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 Vitals Time Taken: 3:43 PM, Height: 59 in, Weight: 218 lbs, BMI: 44, Temperature: 98.4 F, Pulse: 91 bpm, Respiratory Rate: 18 breaths/min, Blood Pressure: 115/79 mmHg. Cardiovascular 2+ dorsalis pedis/posterior tibialis pulses. Psychiatric pleasant and cooperative. General Notes: T the right lower extremity posterior aspect there are 2 open wounds with nonviable tissue and granulation tissue to the wound beds. No o surrounding signs of infection including increased warmth, erythema or purulent drainage. Integumentary (Hair, Skin) Wound #1 status is Open. Original cause of wound was Bite. The date acquired was: 12/16/2022. The wound has been in treatment 3 weeks. The wound is located on the Right,Lateral Lower Leg. The wound measures 0.2cm length x 0.9cm width x 0.2cm depth; 0.141cm^2 area and 0.028cm^3 volume. There is Fat Layer (Subcutaneous Tissue) exposed. There is no tunneling or undermining noted. There is [name] medium amount of serosanguineous drainage noted. The wound margin is distinct with the outline attached to the wound base. There is medium (34-66%) red granulation within the wound bed. There is [name] medium (34-66%) amount of necrotic tissue within the wound bed including Adherent Slough. The periwound skin appearance did not exhibit: Callus, Crepitus, Excoriation, Induration, Rash, Scarring, Dry/Scaly, Maceration, Atrophie Blanche, Cyanosis, Ecchymosis, Hemosiderin Staining, Mottled, Pallor, Rubor, Erythema. Wound #2 status is Open. Original cause of wound was Bite. The date acquired was: 12/16/2022. The wound has been in treatment  3 weeks. The wound is located [name] [name], [name] [name]  ([id number]) [id number] Page 7 of 10 on the Right,Posterior Lower Leg. The wound measures 0.4cm length x 2.3cm width x 0.2cm depth; 0.723cm^2 area and 0.145cm^3 volume. There is Fat Layer (Subcutaneous Tissue) exposed. There is no tunneling or undermining noted. There is [name] medium amount of serosanguineous drainage noted. The wound margin is distinct with the outline attached to the wound base. There is medium (34-66%) red granulation within the wound bed. There is [name] medium (34-66%) amount of necrotic tissue within the wound bed including Adherent Slough. The periwound skin appearance did not exhibit: Callus, Crepitus, Excoriation, Induration, Rash, Scarring, Dry/Scaly, Maceration, Atrophie Blanche, Cyanosis, Ecchymosis, Hemosiderin Staining, Mottled, Pallor, Rubor, Erythema. Assessment Active Problems ICD-10 Non-pressure chronic ulcer of other part of right lower leg with fat layer exposed Bitten by dog, initial encounter Autoimmune thyroiditis Patient's wound has shown improvement in size and appearance since last clinic visit. I debrided nonviable tissue. I recommended continuing the course with Medihoney and Hydrofera Blue. Continue compression stockings daily. Follow-up in 2 weeks and I am hopeful the wound will be healed by then. Procedures Wound #1 Pre-procedure diagnosis of Wound #1 is [name] Trauma, Other located on the Right,Lateral Lower Leg . There was [name] Selective/Open Wound Non-Viable Tissue Debridement with [name] total area of 0.14 sq cm performed by Geralyn Corwin, DO. With the following instrument(s): Curette to remove Non-Viable tissue/material. Material removed includes [facility] after achieving pain control using Lidocaine 4% Topical Solution. No specimens were taken. [name] time out was conducted at 16:06, prior to the start of the procedure. [name] Minimum amount of bleeding was controlled with Pressure. The procedure was tolerated well. Post Debridement Measurements:  0.2cm length x 0.9cm width x 0.2cm depth; 0.028cm^3 volume. Character of Wound/Ulcer Post Debridement is improved. Post procedure Diagnosis Wound #1: Same as Pre-Procedure General Notes: Scribed for Dr Mikey Bussing by Brenton Grills RN. Wound #2 Pre-procedure diagnosis of Wound #2 is [name] Trauma, Other located on the Right,Posterior Lower Leg . There was [name] Selective/Open Wound Non-Viable Tissue Debridement with [name] total area of 0.72 sq cm performed by Geralyn Corwin, DO. With the following instrument(s): Curette to remove Non-Viable tissue/material. Material removed includes [facility] after achieving pain control using Lidocaine 4% Topical Solution. No specimens were taken. [name] time out was conducted at 16:06, prior to the start of the procedure. [name] Minimum amount of bleeding was controlled with Pressure. The procedure was tolerated well. Post Debridement Measurements: 0.4cm length x 2.3cm width x 0.2cm depth; 0.145cm^3 volume. Character of Wound/Ulcer Post Debridement is improved. Post procedure Diagnosis Wound #2: Same as Pre-Procedure General Notes: Scribed for Dr Mikey Bussing by Brenton Grills RN. Plan Follow-up Appointments: Return Appointment in 2 weeks. - Dr. Mikey Bussing room 8 Monday or Tuesday (already scheduled) Anesthetic: (In clinic) Topical Lidocaine 4% applied to wound bed Bathing/ Shower/ Hygiene: May shower and wash wound with soap and water. Edema Control - Lymphedema / SCD / Other: Elevate legs to the level of the heart or above for 30 minutes daily and/or when sitting for 3-4 times [name] day throughout the day. Avoid standing for long periods of time. Other Edema Control Orders/Instructions: - tubrigrip size D apply in the morning and remove at night. WOUND #1: - Lower Leg Wound Laterality: Right, Lateral Cleanser: Soap and Water 1 x Per Day/15 Days Discharge Instructions: May shower and wash wound with dial antibacterial soap and water prior to dressing change. Cleanser: Vashe 5.8 (oz) 1 x Per  Day/15 Days  Discharge Instructions: Cleanse the wound with Vashe prior to applying [name] clean dressing using gauze sponges, not tissue or cotton balls. Prim Dressing: Hydrofera Blue Ready Transfer Foam, 2.5x2.5 (in/in) 1 x Per Day/15 Days ary Discharge Instructions: Apply directly to wound bed as directed Prim Dressing: MediHoney Gel, tube 1.5 (oz) 1 x Per Day/15 Days ary Discharge Instructions: Apply to wound bed as instructed Secondary Dressing: Zetuvit Plus Silicone Border Dressing 4x4 (in/in) (Generic) 1 x Per Day/15 Days Discharge Instructions: Apply silicone border over primary dressing as directed. Secondary Dressing: tubigrip size D 1 x Per Day/15 Days Discharge Instructions: apply in the morning and remove at night. WOUND #2: - Lower Leg Wound Laterality: Right, Posterior Cleanser: Soap and Water 1 x Per Day/15 Days Discharge Instructions: May shower and wash wound with dial antibacterial soap and water prior to dressing change. Peri-Wound Care: Skin Prep (Generic) 1 x Per Day/15 Days Discharge Instructions: Use skin prep as directed Prim Dressing: Hydrofera Blue Ready Transfer Foam, 2.5x2.5 (in/in) 1 x Per Day/15 Days ary Discharge Instructions: Apply directly to wound bed as directed Prim Dressing: MediHoney Gel, tube 1.5 (oz) 1 x Per Day/15 Days ary Discharge Instructions: Apply to wound bed as instructed Secondary Dressing: Zetuvit Plus Silicone Border Dressing 4x4 (in/in) (Generic) 1 x Per Day/15 Days [name] [name], [name] [name] ([id number]) [phone].pdf Page 8 of 10 Discharge Instructions: Apply silicone border over primary dressing as directed. Secondary Dressing: tubigrip size D 1 x Per Day/15 Days Discharge Instructions: apply in the morning and remove at night. 1. In office sharp debridement 2. Medihoney and Hydrofera Blue 3. Compression stockings daily 4. Follow-up in 2 week Electronic Signature(s) Signed: 02/07/2023 4:43:58 PM By: Geralyn Corwin  DO Entered By: Geralyn Corwin on 02/07/2023 16:43:12 -------------------------------------------------------------------------------- HxROS Details Patient Name: Date of Service: [name] [name], [name] Borrow [name] 02/07/2023 3:45 PM Medical Record Number: [id number] Patient Account Number: [id number] Date of Birth/Sex: Treating RN: Feb 06, 1976 (47 y.o. F) Primary Care Provider: Felix Pacini Other Clinician: Referring Provider: Treating Provider/Extender: Lajuana Carry Weeks in Treatment: 3 Information Obtained From Patient Constitutional Symptoms (General Health) Medical History: Past Medical History Notes: Angular cheilitis chicken pox thyroid disease- hashimoto's thyroiditis Eyes Medical History: Negative for: Cataracts; Glaucoma; Optic Neuritis Ear/Nose/Mouth/Throat Medical History: Negative for: Chronic sinus problems/congestion; Middle ear problems Hematologic/Lymphatic Medical History: Negative for: Anemia; Hemophilia; Human Immunodeficiency Virus; Lymphedema; Sickle Cell Disease Respiratory Medical History: Positive for: Sleep Apnea Negative for: Aspiration; Asthma; Chronic Obstructive Pulmonary Disease (COPD); Pneumothorax; Tuberculosis Cardiovascular Medical History: Negative for: Angina; Arrhythmia; Congestive Heart Failure; Coronary Artery Disease; Deep Vein Thrombosis; Hypertension; Hypotension; Myocardial Infarction; Peripheral Arterial Disease; Peripheral Venous Disease; Phlebitis; Vasculitis Gastrointestinal Medical History: Negative for: Cirrhosis ; Colitis; Crohns; Hepatitis [name]; Hepatitis B; Hepatitis C Genitourinary SHARISE, BURGUENO [name] ([id number]) [id number] Page 9 of 10 Medical History: Negative for: End Stage Renal Disease Immunological Medical History: Negative for: Lupus Erythematosus; Raynauds; Scleroderma Integumentary (Skin) Medical History: Negative for: History of Burn Musculoskeletal Medical History: Negative  for: Gout; Rheumatoid Arthritis; Osteoarthritis; Osteomyelitis Past Medical History Notes: arthritis in back Neurologic Medical History: Negative for: Dementia; Neuropathy; Quadriplegia; Paraplegia; Seizure Disorder Oncologic Medical History: Negative for: Received Chemotherapy; Received Radiation Psychiatric Medical History: Positive for: Confinement Anxiety Negative for: Anorexia/bulimia Past Medical History Notes: ADD depression insomnia Immunizations Pneumococcal Vaccine: Received Pneumococcal Vaccination: No Implantable Devices Yes Hospitalization / Surgery History Type of Hospitalization/Surgery DandC 2018 C- section 2004 2007 cervical decom/discectomy fusion Family and Social History Cancer: Yes - Siblings,Maternal Grandparents; Diabetes: Yes - Maternal Grandparents; Heart  Disease: Yes - Paternal Grandparents; Hereditary Spherocytosis: No; Hypertension: Yes - Mother; Kidney Disease: No; Lung Disease: No; Seizures: No; Stroke: Yes - Father; Thyroid Problems: Yes - Maternal Grandparents,Mother; Tuberculosis: No; Never smoker; Marital Status - Divorced; Alcohol Use: Never; Drug Use: No History; Caffeine Use: Daily; Financial Concerns: No; Food, Clothing or Shelter Needs: No; Support System Lacking: No; Transportation Concerns: No Electronic Signature(s) Signed: 02/07/2023 4:43:58 PM By: Geralyn Corwin DO Entered By: Geralyn Corwin on 02/07/2023 16:41:42 -------------------------------------------------------------------------------- SuperBill Details Patient Name: Date of Service: [name] [name], [name] Borrow [name] 02/07/2023 Medical Record Number: [id number] Patient Account Number: [id number] Date of Birth/Sex: Treating RN: 08-05-1975 (47 y.o. 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 Facility Procedures : CPT4 Code: [id number] Description: [id number] - DEBRIDE WOUND 1ST 20 SQ CM OR < ICD-10 Diagnosis Description L97.812 Non-pressure chronic ulcer of other part of right lower leg with fat layer expos W54.0XXA Bitten by dog, initial encounter Modifier: ed Quantity: 1 Physician Procedures : CPT4 Code Description Modifier [id number] - WC PHYS DEBR WO ANESTH 20 SQ CM ICD-10 Diagnosis Description L97.812 Non-pressure chronic ulcer of other part of right lower leg with fat layer exposed W54.0XXA Bitten by dog, initial encounter Quantity: 1 Electronic Signature(s) Signed: 02/07/2023 4:43:58 PM By: Geralyn Corwin DO Previous Signature: 02/07/2023 4:26:23 PM Version By: Brenton Grills Entered By: Geralyn Corwin on 02/07/2023 16:43:42

## 2023-02-08 ENCOUNTER — Encounter: Payer: 59 | Admitting: Family Medicine

## 2023-02-14 ENCOUNTER — Ambulatory Visit (HOSPITAL_BASED_OUTPATIENT_CLINIC_OR_DEPARTMENT_OTHER): Payer: 59 | Admitting: Internal Medicine

## 2023-02-21 ENCOUNTER — Encounter (HOSPITAL_BASED_OUTPATIENT_CLINIC_OR_DEPARTMENT_OTHER): Payer: 59 | Attending: Internal Medicine | Admitting: Internal Medicine

## 2023-02-21 DIAGNOSIS — G4730 Sleep apnea, unspecified: Secondary | ICD-10-CM | POA: Diagnosis not present

## 2023-02-21 DIAGNOSIS — W540XXA Bitten by dog, initial encounter: Secondary | ICD-10-CM | POA: Insufficient documentation

## 2023-02-21 DIAGNOSIS — F419 Anxiety disorder, unspecified: Secondary | ICD-10-CM | POA: Insufficient documentation

## 2023-02-21 DIAGNOSIS — L97812 Non-pressure chronic ulcer of other part of right lower leg with fat layer exposed: Secondary | ICD-10-CM | POA: Diagnosis present

## 2023-02-21 DIAGNOSIS — E063 Autoimmune thyroiditis: Secondary | ICD-10-CM | POA: Diagnosis not present

## 2023-02-21 NOTE — Progress Notes (Signed)
[name] ([id number]) [id number] Page 1 of 10 Visit Report for 02/21/2023 Arrival Information Details Patient Name: Date of Service: [name] [facility] [name] 02/21/2023 3:00 PM Medical Record Number: [id number] Patient Account Number: [id number] Date of Birth/Sex: Treating RN: [birth year] (47 y.o. [name] Primary Care [name]: [name] Other Clinician: Referring [name]: Treating [name]/Extender: [name] Weeks in Treatment: 5 Visit Information History Since Last Visit All ordered tests and consults were completed: Yes Patient Arrived: Ambulatory Added or deleted any medications: No Arrival Time: 14:52 Any new allergies or adverse reactions: No Accompanied By: self Had [name] fall or experienced change in No Transfer Assistance: None activities of daily living that may affect Patient Requires Transmission-Based Precautions: No risk of falls: Patient Has Alerts: Yes Signs or symptoms of abuse/neglect since last visito No Patient Alerts: Patient on Blood Thinner Hospitalized since last visit: No Implantable device outside of the clinic excluding No cellular tissue based products placed in the center since last visit: Has Dressing in Place as Prescribed: Yes Pain Present Now: No Electronic Signature(s) Signed: 02/21/2023 3:48:27 PM By: [name] Entered By: [name] on 02/21/2023 14:52:51 -------------------------------------------------------------------------------- Clinic Level of Care Assessment Details Patient Name: Date of Service: [name] [facility] [name] 02/21/2023 3:00 PM Medical Record Number: [id number] Patient Account Number: [id number] Date of Birth/Sex: Treating RN: [birth year] (47 y.o. [name] Primary Care [name]: [name] Other Clinician: Referring Monte Zinni: Treating [name]/Extender: [name] Weeks in Treatment: 5 Clinic Level of Care Assessment  Items TOOL 4 Quantity Score X- 1 0 Use when only an EandM is performed on FOLLOW-UP visit ASSESSMENTS - Nursing Assessment / Reassessment X- 1 10 Reassessment of Co-morbidities (includes updates in patient status) X- 1 5 Reassessment of Adherence to Treatment Plan ASSESSMENTS - Wound and Skin [name] ssessment / Reassessment X - Simple Wound Assessment / Reassessment - one wound 1 5 X- 1 5 Complex Wound Assessment / Reassessment - multiple wounds X- 1 10 Dermatologic / Skin Assessment (not related to wound area) ASSESSMENTS - Focused Assessment []  - 0 Circumferential Edema Measurements - multi extremities []  - 0 Nutritional Assessment / Counseling / Intervention [name], [name] ([id number]) [id number] Page 2 of 10 []  - 0 Lower Extremity Assessment (monofilament, tuning fork, pulses) []  - 0 Peripheral Arterial Disease Assessment (using hand held doppler) ASSESSMENTS - Ostomy and/or Continence Assessment and Care []  - 0 Incontinence Assessment and Management []  - 0 Ostomy Care Assessment and Management (repouching, etc.) PROCESS - Coordination of Care X - Simple Patient / Family Education for ongoing care 1 15 []  - 0 Complex (extensive) Patient / Family Education for ongoing care X- 1 10 Staff obtains Chiropractor, Records, T Results / Process Orders est []  - 0 Staff telephones HHA, Nursing Homes / Clarify orders / etc []  - 0 Routine Transfer to another Facility (non-emergent condition) []  - 0 Routine Hospital Admission (non-emergent condition) []  - 0 New Admissions / Manufacturing engineer / Ordering NPWT Apligraf, etc. , []  - 0 Emergency Hospital Admission (emergent condition) X- 1 10 Simple Discharge Coordination []  - 0 Complex (extensive) Discharge Coordination PROCESS - Special Needs []  - 0 Pediatric / Minor Patient Management []  - 0 Isolation Patient Management []  - 0 Hearing / Language / Visual special needs []  - 0 Assessment of  Community assistance (transportation, D/C planning, etc.) []  - 0 Additional assistance / Altered mentation []  - 0 Support Surface(s) Assessment (bed, cushion, seat, etc.) INTERVENTIONS - Wound Cleansing / Measurement []  -  0 Simple Wound Cleansing - one wound []  - 0 Complex Wound Cleansing - multiple wounds X- 1 5 Wound Imaging (photographs - any number of wounds) []  - 0 Wound Tracing (instead of photographs) []  - 0 Simple Wound Measurement - one wound []  - 0 Complex Wound Measurement - multiple wounds INTERVENTIONS - Wound Dressings []  - 0 Small Wound Dressing one or multiple wounds []  - 0 Medium Wound Dressing one or multiple wounds []  - 0 Large Wound Dressing one or multiple wounds X- 1 5 Application of Medications - topical []  - 0 Application of Medications - injection INTERVENTIONS - Miscellaneous []  - 0 External ear exam []  - 0 Specimen Collection (cultures, biopsies, blood, body fluids, etc.) []  - 0 Specimen(s) / Culture(s) sent or taken to Lab for analysis []  - 0 Patient Transfer (multiple staff / Nurse, adult / Similar devices) []  - 0 Simple Staple / Suture removal (25 or less) []  - 0 Complex Staple / Suture removal (26 or more) []  - 0 Hypo / Hyperglycemic Management (close monitor of Blood Glucose) [name], [name] ([id number]) [id number] Page 3 of 10 []  - 0 Ankle / Brachial Index (ABI) - do not check if billed separately X- 1 5 Vital Signs Has the patient been seen at the hospital within the last three years: Yes Total Score: 85 Level Of Care: New/Established - Level 3 Electronic Signature(s) Signed: 02/21/2023 3:48:27 PM By: [name] Entered By: [name] on 02/21/2023 15:10:35 -------------------------------------------------------------------------------- Encounter Discharge Information Details Patient Name: Date of Service: [name], [name] 02/21/2023 3:00 PM Medical Record Number: [id number] Patient  Account Number: [id number] Date of Birth/Sex: Treating RN: 25-Jan-1976 (47 y.o. [name] Primary Care Adianna Darwin: [name] Other Clinician: Referring Gilad Dugger: Treating Rolla Servidio/Extender: [name] Weeks in Treatment: 5 Encounter Discharge Information Items Discharge Condition: Stable Ambulatory Status: Ambulatory Discharge Destination: Home Transportation: Private Auto Accompanied By: self Schedule Follow-up Appointment: Yes Clinical Summary of Care: Patient Declined Electronic Signature(s) Signed: 02/21/2023 3:48:27 PM By: [name] Entered By: [name] on 02/21/2023 15:11:42 -------------------------------------------------------------------------------- Lower Extremity Assessment Details Patient Name: Date of Service: [name], [name] 02/21/2023 3:00 PM Medical Record Number: [id number] Patient Account Number: [id number] Date of Birth/Sex: Treating RN: 1975-10-03 (47 y.o. [name] Primary Care Jackelin Correia: [name] Other Clinician: Referring Deyanna Mctier: Treating Damiean Lukes/Extender: Genene Churn, Renee Weeks in Treatment: 5 Edema Assessment Assessed: [Left: No] [Right: No] [Left: Edema] [Right: :] Calf Left: Right: Point of Measurement: 35.5 cm From Medial Instep 40 cm Ankle Left: Right: Point of Measurement: 9 cm From Medial Instep 22 cm Vascular Assessment Left: [[id number] Page 4 of 10Right:] Pulses: Dorsalis Pedis Palpable: [phone].pdf Page 4 of 10Yes] Extremity colors, hair growth, and conditions: Extremity Color: [phone].pdf Page 4 of 10Normal] Hair Growth on Extremity: [phone].pdf Page 4 of 10No] Temperature of Extremity: [phone].pdf Page 4 of 10Warm] Capillary Refill: [phone].pdf Page 4 of 10< 3 seconds] Dependent Rubor:  [phone].pdf Page 4 of 10No No] Toe Nail Assessment Left: Right: Thick: No Discolored: No Deformed: No Improper Length and Hygiene: No Electronic Signature(s) Signed: 02/21/2023 3:48:27 PM By: [name] Entered By: [name] on 02/21/2023 14:55:26 -------------------------------------------------------------------------------- Multi Wound Chart Details Patient Name: Date of Service: [name], [name] 02/21/2023 3:00 PM Medical Record Number: [id number] Patient Account Number: [id number] Date of Birth/Sex: Treating RN: 1976/01/16 (47 y.o. F) Primary Care Rudra Hobbins: [name] Other Clinician: Referring Sheneka Schrom: Treating Daishon Chui/Extender: Genene Churn, Renee Weeks in Treatment: 5 Vital Signs  Height(in): 59 Pulse(bpm): 94 Weight(lbs): 218 Blood Pressure(mmHg): 130/89 Body Mass Index(BMI): 44 Temperature(F): 98.2 Respiratory Rate(breaths/min): 18 [1:Photos:] [N/[name]:N/[name]] Right, Lateral Lower Leg Right, Posterior Lower Leg N/[name] Wound Location: Bite Bite N/[name] Wounding Event: Trauma, Other Trauma, Other N/[name] Primary Etiology: Sleep Apnea, Confinement Anxiety Sleep Apnea, Confinement Anxiety N/[name] Comorbid History: 12/16/2022 12/16/2022 N/[name] Date Acquired: 5 5 N/[name] Weeks of Treatment: Healed - Epithelialized Healed - Epithelialized N/[name] Wound Status: No No N/[name] Wound Recurrence: 0x0x0 0x0x0 N/[name] Measurements L x W x D (cm) 0 0 N/[name] [name] (cm) : rea 0 0 N/[name] Volume (cm) : 100.00% 100.00% N/[name] % Reduction in [name] rea: 100.00% 100.00% N/[name] % Reduction in Volume: Full Thickness Without Exposed Full Thickness Without Exposed N/[name] Classification: Support Structures Support Structures Medium Medium N/[name] Exudate Amount: Serosanguineous Serosanguineous N/[name] Exudate TypeDARON, [name] ([id number]) [phone].pdf Page 5 of 10 red, brown red, brown N/[name] Exudate Color: Distinct, outline attached Distinct, outline attached  N/[name] Wound Margin: Medium (34-66%) Medium (34-66%) N/[name] Granulation Amount: Red Red N/[name] Granulation Quality: Medium (34-66%) Medium (34-66%) N/[name] Necrotic Amount: Fat Layer (Subcutaneous Tissue): Yes Fat Layer (Subcutaneous Tissue): Yes N/[name] Exposed Structures: Fascia: No Fascia: No Tendon: No Tendon: No Muscle: No Muscle: No Joint: No Joint: No Bone: No Bone: No Small (1-33%) Medium (34-66%) N/[name] Epithelialization: Excoriation: No Excoriation: No N/[name] Periwound Skin Texture: Induration: No Induration: No Callus: No Callus: No Crepitus: No Crepitus: No Rash: No Rash: No Scarring: No Scarring: No Maceration: No Maceration: No N/[name] Periwound Skin Moisture: Dry/Scaly: No Dry/Scaly: No Atrophie Blanche: No Atrophie Blanche: No N/[name] Periwound Skin Color: Cyanosis: No Cyanosis: No Ecchymosis: No Ecchymosis: No Erythema: No Erythema: No Hemosiderin Staining: No Hemosiderin Staining: No Mottled: No Mottled: No Pallor: No Pallor: No Rubor: No Rubor: No Treatment Notes Wound #1 (Lower Leg) Wound Laterality: Right, Lateral Cleanser Peri-Wound Care Topical Primary Dressing Secondary Dressing Secured With Compression Wrap Compression Stockings Add-Ons Wound #2 (Lower Leg) Wound Laterality: Right, Posterior Cleanser Peri-Wound Care Topical Primary Dressing Secondary Dressing Secured With Compression Wrap Compression Stockings Add-Ons Electronic Signature(s) Signed: 02/21/2023 3:15:40 PM By: Geralyn Corwin DO Entered By: Geralyn Corwin on 02/21/2023 15:12:10 [name] ([id number]) [id number] Page 6 of 10 -------------------------------------------------------------------------------- Multi-Disciplinary Care Plan Details Patient Name: Date of Service: [name] [facility] [name] 02/21/2023 3:00 PM Medical Record Number: [id number] Patient Account Number: [id number] Date of Birth/Sex: Treating RN: Jul 29, 1975 (47 y.o. [name] Primary Care Kaceton Vieau: [name] Other Clinician: Referring Mackensey Bolte: Treating Chasity Outten/Extender: [name] Weeks in Treatment: 5 Active Inactive Electronic Signature(s) Signed: 02/21/2023 3:48:27 PM By: [name] Entered By: [name] on 02/21/2023 15:08:37 -------------------------------------------------------------------------------- Pain Assessment Details Patient Name: Date of Service: [name], [name] 02/21/2023 3:00 PM Medical Record Number: [id number] Patient Account Number: [id number] Date of Birth/Sex: Treating RN: 12/31/1975 (47 y.o. [name] Primary Care Rozell Kettlewell: [name] Other Clinician: Referring Lena Fieldhouse: Treating Coriann Brouhard/Extender: [name] Weeks in Treatment: 5 Active Problems Location of Pain Severity and Description of Pain Patient Has Paino No Site Locations Pain Management and Medication Current Pain Management: Electronic Signature(s) Signed: 02/21/2023 3:48:27 PM By: [name] Entered By: [name] on 02/21/2023 14:54:49 [name] ([id number]) [phone].pdf Page 7 of 10 -------------------------------------------------------------------------------- Patient/Caregiver Education Details Patient Name: Date of Service: [name] 9/10/2024andnbsp3:00 PM Medical Record Number: [id number] Patient Account Number: [id number] Date of Birth/Gender: Treating RN: 09-01-1975 (47 y.o. [name] Primary Care Physician: [name] Other Clinician: Referring  Physician: Treating Physician/Extender: Delfin Edis in Treatment: 5 Education Assessment Education Provided To: Patient Education Topics Provided Wound/Skin Impairment: Methods: Explain/Verbal Responses: State content correctly Electronic Signature(s) Signed: 02/21/2023 3:48:27 PM By: [name] Entered By: [name] on 02/21/2023  15:09:00 -------------------------------------------------------------------------------- Wound Assessment Details Patient Name: Date of Service: [name], [name] 02/21/2023 3:00 PM Medical Record Number: [id number] Patient Account Number: [id number] Date of Birth/Sex: Treating RN: [birth year] (47 y.o. [name] Primary Care Brigetta Beckstrom: [name] Other Clinician: Referring Kindal Ponti: Treating Gen Clagg/Extender: Genene Churn, Renee Weeks in Treatment: 5 Wound Status Wound Number: 1 Primary Etiology: Trauma, Other Wound Location: Right, Lateral Lower Leg Wound Status: Healed - Epithelialized Wounding Event: Bite Comorbid History: Sleep Apnea, Confinement Anxiety Date Acquired: 12/16/2022 Weeks Of Treatment: 5 Clustered Wound: No Photos Wound Measurements Length: (cm) Width: (cm) Depth: (cm) Area: (cm) Volume: (cm) 0 % Reduction in Area: 100% 0 % Reduction in Volume: 100% 0 Epithelialization: Small (1-33%) 0 Tunneling: No 0 Undermining: No Wound Description [name], [name] ([id number]) Classification: Full Thickness Without Exposed Support Structures Wound Margin: Distinct, outline attached Exudate Amount: Medium Exudate Type: Serosanguineous Exudate Color: red, brown [id number].pdf Page 8 of 10 Foul Odor After Cleansing: No Slough/Fibrino Yes Wound Bed Granulation Amount: Medium (34-66%) Exposed Structure Granulation Quality: Red Fascia Exposed: No Necrotic Amount: Medium (34-66%) Fat Layer (Subcutaneous Tissue) Exposed: Yes Tendon Exposed: No Muscle Exposed: No Joint Exposed: No Bone Exposed: No Periwound Skin Texture Texture Color No Abnormalities Noted: No No Abnormalities Noted: No Callus: No Atrophie Blanche: No Crepitus: No Cyanosis: No Excoriation: No Ecchymosis: No Induration: No Erythema: No Rash: No Hemosiderin Staining: No Scarring: No Mottled: No Pallor: No Moisture Rubor: No No Abnormalities  Noted: No Dry / Scaly: No Maceration: No Treatment Notes Wound #1 (Lower Leg) Wound Laterality: Right, Lateral Cleanser Peri-Wound Care Topical Primary Dressing Secondary Dressing Secured With Compression Wrap Compression Stockings Add-Ons Electronic Signature(s) Signed: 02/21/2023 3:48:27 PM By: [name] Entered By: [name] on 02/21/2023 15:07:55 -------------------------------------------------------------------------------- Wound Assessment Details Patient Name: Date of Service: [name], [name] 02/21/2023 3:00 PM Medical Record Number: [id number] Patient Account Number: [id number] Date of Birth/Sex: Treating RN: Feb 12, 1976 (47 y.o. [name] Primary Care Laiana Fratus: [name] Other Clinician: Referring Dessa Ledee: Treating Theora Vankirk/Extender: Genene Churn, Renee Weeks in Treatment: 5 Wound Status Wound Number: 2 Primary Etiology: Trauma, Other Wound Location: Right, Posterior Lower Leg Wound Status: Healed - Epithelialized Wounding Event: Bite Comorbid History: Sleep Apnea, Confinement Anxiety Date Acquired: 12/16/2022 [name], [name] ([id number]) [phone].pdf Page 9 of 10 Weeks Of Treatment: 5 Clustered Wound: No Photos Wound Measurements Length: (cm) Width: (cm) Depth: (cm) Area: (cm) Volume: (cm) 0 % Reduction in Area: 100% 0 % Reduction in Volume: 100% 0 Epithelialization: Medium (34-66%) 0 Tunneling: No 0 Undermining: No Wound Description Classification: Full Thickness Without Exposed Suppor Wound Margin: Distinct, outline attached Exudate Amount: Medium Exudate Type: Serosanguineous Exudate Color: red, brown t Structures Foul Odor After Cleansing: No Slough/Fibrino Yes Wound Bed Granulation Amount: Medium (34-66%) Exposed Structure Granulation Quality: Red Fascia Exposed: No Necrotic Amount: Medium (34-66%) Fat Layer (Subcutaneous Tissue) Exposed: Yes Tendon Exposed: No Muscle Exposed:  No Joint Exposed: No Bone Exposed: No Periwound Skin Texture Texture Color No Abnormalities Noted: No No Abnormalities Noted: No Callus: No Atrophie Blanche: No Crepitus: No Cyanosis: No Excoriation: No Ecchymosis: No Induration: No Erythema: No Rash: No Hemosiderin Staining: No Scarring: No Mottled: No Pallor: No Moisture Rubor: No No Abnormalities Noted:  No Dry / Scaly: No Maceration: No Treatment Notes Wound #2 (Lower Leg) Wound Laterality: Right, Posterior Cleanser Peri-Wound Care Topical Primary Dressing Secondary Dressing Secured With Compression Wrap Compression Stockings Add-Ons [name], [name] ([id number]) [id number] Page 10 of 10 Electronic Signature(s) Signed: 02/21/2023 3:48:27 PM By: [name] Entered By: [name] on 02/21/2023 15:08:06 -------------------------------------------------------------------------------- Vitals Details Patient Name: Date of Service: [name], [name] [facility] [name] 02/21/2023 3:00 PM Medical Record Number: [id number] Patient Account Number: [id number] Date of Birth/Sex: Treating RN: 08-Sep-1975 (47 y.o. [name] Primary Care Nyal Schachter: [name] Other Clinician: Referring Terina Mcelhinny: Treating Karielle Davidow/Extender: Genene Churn, Renee Weeks in Treatment: 5 Vital Signs Time Taken: 14:52 Temperature (F): 98.2 Height (in): 59 Pulse (bpm): 94 Weight (lbs): 218 Respiratory Rate (breaths/min): 18 Body Mass Index (BMI): 44 Blood Pressure (mmHg): 130/89 Reference Range: 80 - 120 mg / dl Electronic Signature(s) Signed: 02/21/2023 3:48:27 PM By: [name] Entered By: [name] on 02/21/2023 14:54:40

## 2023-02-21 NOTE — Progress Notes (Signed)
MEKHIA, CASAL [name] ([id number]) [id number] Page 1 of 7 Visit Report for 02/21/2023 Chief Complaint Document Details Patient Name: Date of Service: [name] [name] [facility] [name] 02/21/2023 3:00 PM Medical Record Number: [id number] Patient Account Number: [id number] Date of Birth/Sex: Treating RN: [birth year] (47 y.o. F) Primary Care Provider: Felix [name] Other Clinician: Referring Provider: Treating Provider/Extender: Genene Churn, Renee Weeks in Treatment: 5 Information Obtained from: Patient Chief Complaint 01/16/2023; right lower extremity wound from [name] dog bite Electronic Signature(s) Signed: 02/21/2023 3:15:40 PM By: Geralyn Corwin DO Entered By: Geralyn Corwin on 02/21/2023 12:12:29 -------------------------------------------------------------------------------- HPI Details Patient Name: Date of Service: [name] [name], [name] [name] 02/21/2023 3:00 PM Medical Record Number: [id number] Patient Account Number: [id number] Date of Birth/Sex: Treating RN: 1976-05-03 (47 y.o. F) Primary Care Provider: Felix [name] Other Clinician: Referring Provider: Treating Provider/Extender: Genene Churn, Renee Weeks in Treatment: 5 History of Present Illness ssociated Signs and Symptoms: ABI done on 01/16/2023 was 0.9 [name] HPI Description: 01/16/2023 [name] [name] is [name] with [name] past medical history of Hashimoto's thyroiditis that presents the clinic for [name] 1 month history of nonhealing wound to the right lower extremity. She was bit by [name] dog on 12/16/2022 and visited the ED for this issue. She had stitches placed and was started on oral antibiotics. The wound dehisced and remaining sutures were taken out by the ED. On 7/19 she again revisited the ED for wound check and was started on clindamycin. Patient has completed this course and currently denies signs of infection. She is also been following with her primary care physician for this issue. She has  been using bag balm to the wound bed. Her Tdap vaccine is up-to-date and she had [name] rabies vaccine. 8/13; patient presents for follow-up. She has been using Vashe wet-to-dry dressings to the wound beds. The wounds are smaller. She has no issues or complaints today. 8/20; patient presents for follow-up. She has been using Medihoney and Hydrofera Blue to the wound bed. The wounds are smaller. She has no issues or complaints. 8/27; patient presents for follow-up. She has been using Medihoney and Hydrofera Blue to the wound beds. The wound are smaller. 9/10; patient presents for follow-up. She has been using Medihoney and Hydrofera Blue to the wound beds. The wounds have healed. Electronic Signature(s) Signed: 02/21/2023 3:15:40 PM By: Geralyn Corwin DO Entered By: Geralyn Corwin on 02/21/2023 12:12:53 Melton Krebs [name] ([id number]) [id number] Page 2 of 7 -------------------------------------------------------------------------------- Physical Exam Details Patient Name: Date of Service: [name] [name] [facility] [name] 02/21/2023 3:00 PM Medical Record Number: [id number] Patient Account Number: [id number] Date of Birth/Sex: Treating RN: [birth year] (47 y.o. F) Primary Care Provider: Felix [name] Other Clinician: Referring Provider: Treating Provider/Extender: Genene Churn, Renee Weeks in Treatment: 5 Constitutional respirations regular, non-labored and within target range for patient.. Cardiovascular 2+ dorsalis pedis/posterior tibialis pulses. Psychiatric pleasant and cooperative. Notes T the right lower extremity posterior aspect there Is epithelization to the previous wound sites. No surrounding signs of infection including increased warmth, o erythema or purulent drainage. Electronic Signature(s) Signed: 02/21/2023 3:15:40 PM By: Geralyn Corwin DO Entered By: Geralyn Corwin on 02/21/2023  12:13:35 -------------------------------------------------------------------------------- Physician Orders Details Patient Name: Date of Service: [name] [name], [name] [name] 02/21/2023 3:00 PM Medical Record Number: [id number] Patient Account Number: [id number] Date of Birth/Sex: Treating RN: 09/06/1975 (47 y.o. Gevena Mart Primary Care Provider: Felix [name] Other Clinician: Referring Provider: Treating Provider/Extender: Lajuana Carry Weeks in Treatment: 5 Verbal / Phone Orders: No  Diagnosis Coding Discharge From [facility] Services Discharge from Wound Care Center - Congratulations on healing!!!!! Anesthetic (In clinic) Topical Lidocaine 4% applied to wound bed Bathing/ Shower/ Hygiene May shower and wash wound with soap and water. Edema Control - Lymphedema / SCD / Other Elevate legs to the level of the heart or above for 30 minutes daily and/or when sitting for 3-4 times [name] day throughout the day. Avoid standing for long periods of time. Other Edema Control Orders/Instructions: - tubrigrip size D apply in the morning and remove at night. Electronic Signature(s) Signed: 02/21/2023 3:15:40 PM By: Geralyn Corwin DO Entered By: Geralyn Corwin on 02/21/2023 12:13:44 Melton Krebs [name] ([id number]) [id number] Page 3 of 7 -------------------------------------------------------------------------------- Problem List Details Patient Name: Date of Service: [name] [name] [facility] [name] 02/21/2023 3:00 PM Medical Record Number: [id number] Patient Account Number: [id number] Date of Birth/Sex: Treating RN: May 27, 1976 (47 y.o. F) Primary Care Provider: Felix [name] Other Clinician: Referring Provider: Treating Provider/Extender: Genene Churn, Renee Weeks in Treatment: 5 Active Problems ICD-10 Encounter Code Description Active Date MDM Diagnosis L97.812 Non-pressure chronic ulcer of other part of right lower leg with fat layer 01/16/2023 No  Yes exposed W54.0XXA Bitten by dog, initial encounter 01/16/2023 No Yes E06.3 Autoimmune thyroiditis 01/16/2023 No Yes Inactive Problems Resolved Problems Electronic Signature(s) Signed: 02/21/2023 3:15:40 PM By: Geralyn Corwin DO Entered By: Geralyn Corwin on 02/21/2023 12:12:03 -------------------------------------------------------------------------------- Progress Note Details Patient Name: Date of Service: [name] [name], [name] [name] 02/21/2023 3:00 PM Medical Record Number: [id number] Patient Account Number: [id number] Date of Birth/Sex: Treating RN: [birth year] (47 y.o. F) Primary Care Provider: Felix [name] Other Clinician: Referring Provider: Treating Provider/Extender: Genene Churn, Renee Weeks in Treatment: 5 Subjective Chief Complaint Information obtained from Patient 01/16/2023; right lower extremity wound from [name] dog bite History of Present Illness (HPI) The following HPI elements were documented for the patient's wound: Associated Signs and Symptoms: ABI done on 01/16/2023 was 0.9 01/16/2023 Ms. [name] [name] is [name] with [name] past medical history of Hashimoto's thyroiditis that presents the clinic for [name] 1 month history of nonhealing wound to the right lower extremity. She was bit by [name] dog on 12/16/2022 and visited the ED for this issue. She had stitches placed and was started on oral antibiotics. The wound dehisced and remaining sutures were taken out by the ED. On 7/19 she again revisited the ED for wound check and was started on clindamycin. Patient has completed this course and currently denies signs of infection. She is also been following with her primary care physician for this issue. She has been using bag balm to the wound bed. Her Tdap vaccine is up-to-date and she had [name] rabies vaccine. [name] [name], [name] [name] ([id number]) [id number] Page 4 of 7 8/13; patient presents for follow-up. She has been using Vashe wet-to-dry dressings  to the wound beds. The wounds are smaller. She has no issues or complaints today. 8/20; patient presents for follow-up. She has been using Medihoney and Hydrofera Blue to the wound bed. The wounds are smaller. She has no issues or complaints. 8/27; patient presents for follow-up. She has been using Medihoney and Hydrofera Blue to the wound beds. The wound are smaller. 9/10; patient presents for follow-up. She has been using Medihoney and Hydrofera Blue to the wound beds. The wounds have healed. Patient History Information obtained from Patient. Family History Cancer - Siblings,Maternal Grandparents, Diabetes - Maternal Grandparents, Heart Disease - Paternal Grandparents, Hypertension - Mother, Stroke - Father, Thyroid Problems - Maternal Grandparents,Mother, No  family history of Hereditary Spherocytosis, Kidney Disease, Lung Disease, Seizures, Tuberculosis. Social History Never smoker, Marital Status - Divorced, Alcohol Use - Never, Drug Use - No History, Caffeine Use - Daily. Medical History Eyes Denies history of Cataracts, Glaucoma, Optic Neuritis Ear/Nose/Mouth/Throat Denies history of Chronic sinus problems/congestion, Middle ear problems Hematologic/Lymphatic Denies history of Anemia, Hemophilia, Human Immunodeficiency Virus, Lymphedema, Sickle Cell Disease Respiratory Patient has history of Sleep Apnea Denies history of Aspiration, Asthma, Chronic Obstructive Pulmonary Disease (COPD), Pneumothorax, Tuberculosis Cardiovascular Denies history of Angina, Arrhythmia, Congestive Heart Failure, Coronary Artery Disease, Deep Vein Thrombosis, Hypertension, Hypotension, Myocardial Infarction, Peripheral Arterial Disease, Peripheral Venous Disease, Phlebitis, Vasculitis Gastrointestinal Denies history of Cirrhosis , Colitis, Crohns, Hepatitis [name], Hepatitis B, Hepatitis C Genitourinary Denies history of End Stage Renal Disease Immunological Denies history of Lupus Erythematosus, Raynauds,  Scleroderma Integumentary (Skin) Denies history of History of Burn Musculoskeletal Denies history of Gout, Rheumatoid Arthritis, Osteoarthritis, Osteomyelitis Neurologic Denies history of Dementia, Neuropathy, Quadriplegia, Paraplegia, Seizure Disorder Oncologic Denies history of Received Chemotherapy, Received Radiation Psychiatric Patient has history of Confinement Anxiety Denies history of Anorexia/bulimia Hospitalization/Surgery History - DandC 2018. - C- section 2004 2007. - cervical decom/discectomy fusion. 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 Vitals Time Taken: 2:52 PM, Height: 59 in, Weight: 218 lbs, BMI: 44, Temperature: 98.2 F, Pulse: 94 bpm, Respiratory Rate: 18 breaths/min, Blood Pressure: 130/89 mmHg. Cardiovascular 2+ dorsalis pedis/posterior tibialis pulses. Psychiatric pleasant and cooperative. General Notes: T the right lower extremity posterior aspect there Is epithelization to the previous wound sites. No surrounding signs of infection including [name] [name], [name] [name] ([id number]) [id number] Page 5 of 7 increased warmth, erythema or purulent drainage. Integumentary (Hair, Skin) Wound #1 status is Healed - Epithelialized. Original cause of wound was Bite. The date acquired was: 12/16/2022. The wound has been in treatment 5 weeks. The wound is located on the Right,Lateral Lower Leg. The wound measures 0cm length x 0cm width x 0cm depth; 0cm^2 area and 0cm^3 volume. There is Fat Layer (Subcutaneous Tissue) exposed. There is no tunneling or undermining noted. There is [name] medium amount of serosanguineous drainage noted. The wound margin is distinct with the outline  attached to the wound base. There is medium (34-66%) red granulation within the wound bed. There is [name] medium (34-66%) amount of necrotic tissue within the wound bed. The periwound skin appearance did not exhibit: Callus, Crepitus, Excoriation, Induration, Rash, Scarring, Dry/Scaly, Maceration, Atrophie Blanche, Cyanosis, Ecchymosis, Hemosiderin Staining, Mottled, Pallor, Rubor, Erythema. Wound #2 status is Healed - Epithelialized. Original cause of wound was Bite. The date acquired was: 12/16/2022. The wound has been in treatment 5 weeks. The wound is located on the Right,Posterior Lower Leg. The wound measures 0cm length x 0cm width x 0cm depth; 0cm^2 area and 0cm^3 volume. There is Fat Layer (Subcutaneous Tissue) exposed. There is no tunneling or undermining noted. There is [name] medium amount of serosanguineous drainage noted. The wound margin is distinct with the outline attached to the wound base. There is medium (34-66%) red granulation within the wound bed. There is [name] medium (34-66%) amount of necrotic tissue within the wound bed. The periwound skin appearance did not exhibit: Callus, Crepitus, Excoriation, Induration, Rash, Scarring, Dry/Scaly, Maceration, Atrophie Blanche, Cyanosis, Ecchymosis, Hemosiderin Staining, Mottled, Pallor, Rubor, Erythema. Assessment Active Problems ICD-10 Non-pressure chronic ulcer of other part of  right lower leg with fat layer exposed Bitten by dog, initial encounter Autoimmune thyroiditis Patient has done well with Hydrofera Blue and Medihoney. Her wounds have healed. I recommended she wear her compression stockings for at least 1 more week to help edema control wall her skin continues to heal further. Follow-up as needed. Plan Discharge From [facility] Services: Discharge from Wound Care Center - Congratulations on healing!!!!! Anesthetic: (In clinic) Topical Lidocaine 4% applied to wound bed Bathing/ Shower/ Hygiene: May shower and wash wound with soap and  water. Edema Control - Lymphedema / SCD / Other: Elevate legs to the level of the heart or above for 30 minutes daily and/or when sitting for 3-4 times [name] day throughout the day. Avoid standing for long periods of time. Other Edema Control Orders/Instructions: - tubrigrip size D apply in the morning and remove at night. 1. Compression stockings daily 2. Follow-up as needed 3. Discharge from clinic due to closed wound Electronic Signature(s) Signed: 02/21/2023 3:15:40 PM By: Geralyn Corwin DO Entered By: Geralyn Corwin on 02/21/2023 12:14:38 -------------------------------------------------------------------------------- HxROS Details Patient Name: Date of Service: [name] [name], [name] [name] 02/21/2023 3:00 PM Medical Record Number: [id number] Patient Account Number: [id number] Date of Birth/Sex: Treating RN: 07/20/1975 (47 y.o. F) Primary Care Provider: Felix [name] Other Clinician: Referring Provider: Treating Provider/Extender: Lajuana Carry Weeks in Treatment: 5 Information Obtained From Patient [name] [name], [name] [name] ([id number]) [id number] Page 6 of 7 Constitutional Symptoms (General Health) Medical History: Past Medical History Notes: Angular cheilitis chicken pox thyroid disease- hashimoto's thyroiditis Eyes Medical History: Negative for: Cataracts; Glaucoma; Optic Neuritis Ear/Nose/Mouth/Throat Medical History: Negative for: Chronic sinus problems/congestion; Middle ear problems Hematologic/Lymphatic Medical History: Negative for: Anemia; Hemophilia; Human Immunodeficiency Virus; Lymphedema; Sickle Cell Disease Respiratory Medical History: Positive for: Sleep Apnea Negative for: Aspiration; Asthma; Chronic Obstructive Pulmonary Disease (COPD); Pneumothorax; Tuberculosis Cardiovascular Medical History: Negative for: Angina; Arrhythmia; Congestive Heart Failure; Coronary Artery Disease; Deep Vein Thrombosis; Hypertension;  Hypotension; Myocardial Infarction; Peripheral Arterial Disease; Peripheral Venous Disease; Phlebitis; Vasculitis Gastrointestinal Medical History: Negative for: Cirrhosis ; Colitis; Crohns; Hepatitis [name]; Hepatitis B; Hepatitis C Genitourinary Medical History: Negative for: End Stage Renal Disease Immunological Medical History: Negative for: Lupus Erythematosus; Raynauds; Scleroderma Integumentary (Skin) Medical History: Negative for: History of Burn Musculoskeletal Medical History: Negative for: Gout; Rheumatoid Arthritis; Osteoarthritis; Osteomyelitis Past Medical History Notes: arthritis in back Neurologic Medical History: Negative for: Dementia; Neuropathy; Quadriplegia; Paraplegia; Seizure Disorder Oncologic Medical History: Negative for: Received Chemotherapy; Received Radiation Psychiatric Medical History: Positive for: Confinement Anxiety Negative for: Anorexia/bulimia Past Medical History Notes: ADD depression insomnia [name] [name], [name] [name] ([id number]) [phone].pdf Page 7 of 7 Immunizations Pneumococcal Vaccine: Received Pneumococcal Vaccination: No Implantable Devices Yes Hospitalization / Surgery History Type of Hospitalization/Surgery DandC 2018 C- section 2004 2007 cervical decom/discectomy fusion Family and Social History Cancer: Yes - Siblings,Maternal Grandparents; Diabetes: Yes - Maternal Grandparents; Heart Disease: Yes - Paternal Grandparents; Hereditary Spherocytosis: No; Hypertension: Yes - Mother; Kidney Disease: No; Lung Disease: No; Seizures: No; Stroke: Yes - Father; Thyroid Problems: Yes - Maternal Grandparents,Mother; Tuberculosis: No; Never smoker; Marital Status - Divorced; Alcohol Use: Never; Drug Use: No History; Caffeine Use: Daily; Financial Concerns: No; Food, Clothing or Shelter Needs: No; Support System Lacking: No; Transportation Concerns: No Electronic Signature(s) Signed: 02/21/2023 3:15:40 PM By: Geralyn Corwin DO Entered By: Geralyn Corwin on 02/21/2023 12:13:00 -------------------------------------------------------------------------------- SuperBill Details Patient Name: Date of Service: [name] [name], [name] [name] 02/21/2023 Medical Record Number: [id number] Patient Account Number: [id number] Date of Birth/Sex: Treating RN: [birth year] (  47 y.o. Gevena Mart Primary Care Provider: Felix [name] Other Clinician: Referring Provider: Treating Provider/Extender: Lajuana Carry Weeks in Treatment: 5 Diagnosis Coding ICD-10 Codes Code Description [phone] Non-pressure chronic ulcer of other part of right lower leg with fat layer exposed W54.0XXA Bitten by dog, initial encounter E06.3 Autoimmune thyroiditis Facility Procedures : CPT4 Code: [id number] Description: 99213 - WOUND CARE VISIT-LEV 3 EST PT Modifier: Quantity: 1 Physician Procedures : CPT4 Code Description Modifier [id number] 99213 - WC PHYS LEVEL 3 - EST PT ICD-10 Diagnosis Description L97.812 Non-pressure chronic ulcer of other part of right lower leg with fat layer exposed W54.0XXA Bitten by dog, initial encounter E06.3 Autoimmune  thyroiditis Quantity: 1 Electronic Signature(s) Signed: 02/21/2023 3:15:40 PM By: Geralyn Corwin DO Entered By: Geralyn Corwin on 02/21/2023 12:14:51

## 2023-03-01 ENCOUNTER — Encounter: Payer: Self-pay | Admitting: Family Medicine

## 2023-03-01 ENCOUNTER — Ambulatory Visit (INDEPENDENT_AMBULATORY_CARE_PROVIDER_SITE_OTHER): Payer: 59 | Admitting: Family Medicine

## 2023-03-01 VITALS — BP 107/69 | HR 72 | Temp 97.9°F | Ht <= 58 in | Wt 216.0 lb

## 2023-03-01 DIAGNOSIS — Z0000 Encounter for general adult medical examination without abnormal findings: Secondary | ICD-10-CM | POA: Diagnosis not present

## 2023-03-01 DIAGNOSIS — Z23 Encounter for immunization: Secondary | ICD-10-CM | POA: Diagnosis not present

## 2023-03-01 DIAGNOSIS — Z13220 Encounter for screening for lipoid disorders: Secondary | ICD-10-CM

## 2023-03-01 DIAGNOSIS — Z1231 Encounter for screening mammogram for malignant neoplasm of breast: Secondary | ICD-10-CM

## 2023-03-01 DIAGNOSIS — R7303 Prediabetes: Secondary | ICD-10-CM | POA: Diagnosis not present

## 2023-03-01 DIAGNOSIS — Z131 Encounter for screening for diabetes mellitus: Secondary | ICD-10-CM

## 2023-03-01 DIAGNOSIS — E038 Other specified hypothyroidism: Secondary | ICD-10-CM

## 2023-03-01 DIAGNOSIS — F320 Major depressive disorder, single episode, mild: Secondary | ICD-10-CM

## 2023-03-01 DIAGNOSIS — Z1211 Encounter for screening for malignant neoplasm of colon: Secondary | ICD-10-CM

## 2023-03-01 DIAGNOSIS — F902 Attention-deficit hyperactivity disorder, combined type: Secondary | ICD-10-CM | POA: Diagnosis not present

## 2023-03-01 DIAGNOSIS — E063 Autoimmune thyroiditis: Secondary | ICD-10-CM

## 2023-03-01 MED ORDER — AMPHETAMINE-DEXTROAMPHETAMINE 20 MG PO TABS: 20.0000 mg | ORAL_TABLET | Freq: Two times a day (BID) | ORAL | 0 refills | Status: DC

## 2023-03-01 MED ORDER — VENLAFAXINE HCL ER 150 MG PO CP24: 150.0000 mg | ORAL_CAPSULE | Freq: Every day | ORAL | 1 refills | Status: DC

## 2023-03-01 MED ORDER — LEVOCETIRIZINE DIHYDROCHLORIDE 5 MG PO TABS: 5.0000 mg | ORAL_TABLET | Freq: Every evening | ORAL | 3 refills | Status: DC

## 2023-03-01 NOTE — Patient Instructions (Addendum)

## 2023-03-01 NOTE — Progress Notes (Signed)
Patient ID: [name], female  DOB: 06/06/1976, 47 y.o.   MRN: [id number] Patient Care Team    Relationship Specialty Notifications Start End  Natalia Leatherwood, DO PCP - General Family Medicine  10/03/17   Peterson Ao, MD Referring Physician Internal Medicine  10/04/17   Tobias Alexander, OD Referring Physician Optometry  10/04/17   Consuello Closs., MD Referring Physician Obstetrics and Gynecology  10/04/17     Chief Complaint  Patient presents with   Annual Exam    Pt is not fasting    Subjective: [name] is a 47 y.o.  Female  present for CPE/ and Chronic Conditions/illness Management  All past medical history, surgical history, allergies, family history, immunizations, medications and social history were updated in the electronic medical record today. All recent labs, ED visits and hospitalizations within the last year were reviewed.  Health maintenance:  Colonoscopy: routine screen 47. Gastro referral placed.  Mammogram: completed:1020/2023- mobile bus-OR> ordered for mobile bus October at OR  Cervical cancer screening: last pap: 2021,5 yr results:GYN Immunizations: tdap UTD 2022, Influenza declined (encouraged yearly),covid x2 Infectious disease screening: HIV completed and Hep C completed  DEXA: routine screen Assistive device: None Oxygen use: None Patient has a Dental home. Hospitalizations/ED visits: Reviewed   Depression/ADD: Patient reports compliance w/ Effexor 150 mg daily. Her ADD is well-managed on adderall 20 mg BID, however she does not feel it is as well-controlled as before. She reports she has been on multiple different medications with either side effects or  failure with Cymbalta, Lexapro, Trintellex, Brintellix, vybrid (nightmares), Zoloft(wt) and Paxil.   Indication for controlled substance: ADD Medication and dose: Adderall 15 mg twice daily # pills per month: 60/mo> refill 90 day at a time.  Last UDS date: collected today UDS  UTD contract signed (Y/N): Y- resigned today 06/02/2020 Date narcotic database last reviewed (include red flags): 03/01/2023  Hypothyroidism: Patient reports compliance with levothyroxine 88 mcg.       03/01/2023    2:35 PM 12/19/2022    2:32 PM 07/20/2022    9:59 AM 01/26/2022    1:34 PM 08/24/2021    9:58 AM  Depression screen PHQ 2/9  Decreased Interest 0 0 0 0 0  Down, Depressed, Hopeless 0 0 0 0 0  PHQ - 2 Score 0 0 0 0 0  Altered sleeping 1 0 0 2 0  Tired, decreased energy 0 0 0 1 0  Change in appetite 0 0 1 1 2   Feeling bad or failure about yourself  0 0 0 0 0  Trouble concentrating 0 0 1 1 2   Moving slowly or fidgety/restless 0 0 0 0 0  Suicidal thoughts 0 0 0 0 0  PHQ-9 Score 1 0 2 5 4   Difficult doing work/chores Not difficult at all  Not difficult at all Somewhat difficult       03/01/2023    2:35 PM 07/20/2022    9:59 AM 01/26/2022    1:35 PM 08/24/2021    9:59 AM  GAD 7 : Generalized Anxiety Score  Nervous, Anxious, on Edge 0 0 0 1  Control/stop worrying 0 0 0 0  Worry too much - different things 1 0 0 0  Trouble relaxing 0 0 1 0  Restless 0 0 1 0  Easily annoyed or irritable 0 0 0 0  Afraid - awful might happen 0 0 0 0  Total GAD 7 Score 1 0 2 1  Anxiety Difficulty Not difficult at all  Not difficult at all            Immunization History  Administered Date(s) Administered   Influenza Inj Mdck Quad With Preservative 03/14/2014   Influenza Split 04/05/2011   Influenza, Seasonal, Injecte, Preservative Fre 03/13/2010, 04/05/2011   Influenza,inj,Quad PF,6+ Mos 06/02/2020   Influenza-Unspecified 04/05/2011, 03/14/2014, 03/18/2017   PFIZER(Purple Top)SARS-COV-2 Vaccination 07/24/2019, 08/21/2019, 08/29/2020   Tdap 04/05/2011, 08/19/2020     Past Medical History:  Diagnosis Date   ADD (attention deficit disorder)    Angular cheilitis 03/13/2018   Arthritis    Chicken pox    Chronic neck pain    HNP   Depression    Headache    Heart murmur    as a baby    Insomnia    Nocturia    Sleep apnea    CPAP-SLEEP STUDY DONE ABOUT 2 YRS AGO   Thyroid disease    UTI (urinary tract infection)    Allergies  Allergen Reactions   Ativan [Lorazepam] Other (See Comments)    hyperactivity   Metformin And Related    Penicillins Rash   Past Surgical History:  Procedure Laterality Date   ANTERIOR CERVICAL DECOMP/DISCECTOMY FUSION N/A 06/02/2014   Procedure: C6-7 Anterior Cervical Discectomy and Fusion, Allograft, Plate;  Surgeon: Eldred Manges, MD;  Location: [facility] OR;  Service: Orthopedics;  Laterality: N/A;   CESAREAN SECTION  2004/2007   DILATION AND CURETTAGE OF UTERUS  2018   wisdom teeth extracted      Family History  Problem Relation Age of Onset   Arthritis Mother    Depression Mother    Drug abuse Mother    Hyperlipidemia Mother    Hypertension Mother    Alcohol abuse Father    Cancer - Cervical Sister    Breast cancer Maternal Grandmother    Thyroid nodules Maternal Grandmother    Heart disease Paternal Grandmother    Alcohol abuse Paternal Grandfather    Early death Son    Social History   Social History Narrative   Married.  2 children.   Some college.  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   Drinks caffeinated beverages.  Uses herbal remedies.   Smoke alarms in the home.  Wears her seatbelt.   Feels safe in her relationships.    Allergies as of 03/01/2023       Reactions   Ativan [lorazepam] Other (See Comments)   hyperactivity   Metformin And Related    Penicillins Rash        Medication List        Accurate as of March 01, 2023  2:48 PM. If you have any questions, ask your nurse or doctor.          amphetamine-dextroamphetamine 20 MG tablet Commonly known as: ADDERALL Take 1 tablet (20 mg total) by mouth 2 (two) times daily. What changed: Another medication with the same name was changed. Make sure you understand how and when to take each. Changed by: Felix Pacini   amphetamine-dextroamphetamine 20 MG  tablet Commonly known as: ADDERALL Take 1 tablet (20 mg total) by mouth 2 (two) times daily. Start taking on: May 08, 2023 What changed: These instructions start on May 08, 2023. If you are unsure what to do until then, ask your doctor or other care provider. Changed by: Felix Pacini   levocetirizine 5 MG tablet Commonly known as: Xyzal Take 1 tablet (5 mg total) by mouth every evening.   levonorgestrel  20 MCG/24HR IUD Commonly known as: MIRENA 1 each by Intrauterine route once.   levothyroxine 88 MCG tablet Commonly known as: SYNTHROID Take 1 tablet (88 mcg total) by mouth daily before breakfast.   venlafaxine XR 150 MG 24 hr capsule Commonly known as: Effexor XR Take 1 capsule (150 mg total) by mouth daily with breakfast.        All past medical history, surgical history, allergies, family history, immunizations andmedications were updated in the EMR today and reviewed under the history and medication portions of their EMR.     No results found for this or any previous visit (from the past 2160 hour(s)).   No results found.   ROS 14 pt review of systems performed and negative (unless mentioned in an HPI)  Objective: BP 107/69   Pulse 72   Temp 97.9 F (36.6 C)   Ht 4\' 10"  (1.473 m)   Wt 216 lb (98 kg)   SpO2 97%   BMI 45.14 kg/m  Physical Exam Vitals and nursing note reviewed.  Constitutional:      General: She is not in acute distress.    Appearance: Normal appearance. She is obese. She is not ill-appearing or toxic-appearing.  HENT:     Head: Normocephalic and atraumatic.     Right Ear: Tympanic membrane, ear canal and external ear normal. There is no impacted cerumen.     Left Ear: Tympanic membrane, ear canal and external ear normal. There is no impacted cerumen.     Nose: No congestion or rhinorrhea.     Mouth/Throat:     Mouth: Mucous membranes are moist.     Pharynx: Oropharynx is clear. No oropharyngeal exudate or posterior oropharyngeal  erythema.  Eyes:     General: No scleral icterus.       Right eye: No discharge.        Left eye: No discharge.     Extraocular Movements: Extraocular movements intact.     Conjunctiva/sclera: Conjunctivae normal.     Pupils: Pupils are equal, round, and reactive to light.  Cardiovascular:     Rate and Rhythm: Normal rate and regular rhythm.     Pulses: Normal pulses.     Heart sounds: Normal heart sounds. No murmur heard.    No friction rub. No gallop.  Pulmonary:     Effort: Pulmonary effort is normal. No respiratory distress.     Breath sounds: Normal breath sounds. No stridor. No wheezing, rhonchi or rales.  Chest:     Chest wall: No tenderness.  Abdominal:     General: Abdomen is flat. Bowel sounds are normal. There is no distension.     Palpations: Abdomen is soft. There is no mass.     Tenderness: There is no abdominal tenderness. There is no right CVA tenderness, left CVA tenderness, guarding or rebound.     Hernia: No hernia is present.  Musculoskeletal:        General: No swelling, tenderness or deformity. Normal range of motion.     Cervical back: Normal range of motion and neck supple. No rigidity or tenderness.     Right lower leg: No edema.     Left lower leg: No edema.  Lymphadenopathy:     Cervical: No cervical adenopathy.  Skin:    General: Skin is warm and dry.     Coloration: Skin is not jaundiced or pale.     Findings: No bruising, erythema, lesion or rash.  Neurological:  General: No focal deficit present.     Mental Status: She is alert and oriented to person, place, and time. Mental status is at baseline.     Cranial Nerves: No cranial nerve deficit.     Sensory: No sensory deficit.     Motor: No weakness.     Coordination: Coordination normal.     Gait: Gait normal.     Deep Tendon Reflexes: Reflexes normal.  Psychiatric:        Mood and Affect: Mood normal.        Behavior: Behavior normal.        Thought Content: Thought content normal.         Judgment: Judgment normal.       No results found.  Assessment/plan: Drexel Alavez is a 47 y.o. female present for CPE and chronic condition management Depression, major, single episode, mild (HCC) Stable Continue Effexor 150 mg daily daily  ADHD:  Stable Continue Adderall 20 mg twice daily (90d x2)- may take 1/2-1 tab by noon,  17-month prescription provided. NCCS database reviewed 03/01/23 Controlled substance contract in place F/u q 5.5 mos   Hypothyroidism: TSH collected today Continue levothyroxine 88 mcg qd for now.  Refills will be provided and appropriate dose once results received  Routine general medical examination at a health care facility Patient was encouraged to exercise greater than 150 minutes a week. Patient was encouraged to choose a diet filled with fresh fruits and vegetables, and lean meats. AVS provided to patient today for education/recommendation on gender specific health and safety maintenance. Colonoscopy: routine screen 47. Gastro referral placed.  Mammogram: completed:1020/2023- mobile bus-OR> ordered for mobile bus October at OR  Cervical cancer screening: last pap: 2021,5 yr results:GYN Immunizations: tdap UTD 2022, Influenza declined (encouraged yearly),covid x2 Infectious disease screening: HIV completed and Hep C completed  DEXA: routine screen.   Return in about 24 weeks (around 08/16/2023) for Routine chronic condition follow-up.  Orders Placed This Encounter  Procedures   MM 3D SCREENING MAMMOGRAM BILATERAL BREAST   Comprehensive metabolic panel   Hemoglobin A1c   TSH   Lipid panel   CBC   Ambulatory referral to Gastroenterology    Orders Placed This Encounter  Procedures   MM 3D SCREENING MAMMOGRAM BILATERAL BREAST   Comprehensive metabolic panel   Hemoglobin A1c   TSH   Lipid panel   CBC   Ambulatory referral to Gastroenterology   Meds ordered this encounter  Medications   amphetamine-dextroamphetamine (ADDERALL)  20 MG tablet    Sig: Take 1 tablet (20 mg total) by mouth 2 (two) times daily.    Dispense:  180 tablet    Refill:  0    May fill ~85d after prescribe date   amphetamine-dextroamphetamine (ADDERALL) 20 MG tablet    Sig: Take 1 tablet (20 mg total) by mouth 2 (two) times daily.    Dispense:  180 tablet    Refill:  0   levocetirizine (XYZAL) 5 MG tablet    Sig: Take 1 tablet (5 mg total) by mouth every evening.    Dispense:  90 tablet    Refill:  3   venlafaxine XR (EFFEXOR XR) 150 MG 24 hr capsule    Sig: Take 1 capsule (150 mg total) by mouth daily with breakfast.    Dispense:  90 capsule    Refill:  1   Referral Orders         Ambulatory referral to Gastroenterology  Electronically signed by: Felix Pacini, DO [facility]

## 2023-03-02 LAB — COMPREHENSIVE METABOLIC PANEL
ALT: 13 U/L (ref 0–35)
AST: 13 U/L (ref 0–37)
Albumin: 3.8 g/dL (ref 3.5–5.2)
Alkaline Phosphatase: 80 U/L (ref 39–117)
BUN: 13 mg/dL (ref 6–23)
CO2: 28 mEq/L (ref 19–32)
Calcium: 8.9 mg/dL (ref 8.4–10.5)
Chloride: 104 mEq/L (ref 96–112)
Creatinine, Ser: 0.62 mg/dL (ref 0.40–1.20)
GFR: 107.33 mL/min (ref >60.00)
Glucose, Bld: 76 mg/dL (ref 70–99)
Potassium: 4.4 mEq/L (ref 3.5–5.1)
Sodium: 140 mEq/L (ref 135–145)
Total Bilirubin: 1.2 mg/dL (ref 0.2–1.2)
Total Protein: 6.6 g/dL (ref 6.0–8.3)

## 2023-03-02 LAB — TSH: TSH: 9.93 u[IU]/mL — ABNORMAL HIGH (ref 0.35–5.50)

## 2023-03-02 LAB — CBC
HCT: 45.6 % (ref 36.0–46.0)
Hemoglobin: 14.8 g/dL (ref 12.0–15.0)
MCHC: 32.5 g/dL (ref 30.0–36.0)
MCV: 90.9 fl (ref 78.0–100.0)
Platelets: 353 10*3/uL (ref 150.0–400.0)
RBC: 5.02 Mil/uL (ref 3.87–5.11)
RDW: 13.7 % (ref 11.5–15.5)
WBC: 11.2 10*3/uL — ABNORMAL HIGH (ref 4.0–10.5)

## 2023-03-02 LAB — LIPID PANEL
Cholesterol: 154 mg/dL (ref 0–200)
HDL: 56.7 mg/dL (ref >39.00)
LDL Cholesterol: 88 mg/dL (ref 0–99)
NonHDL: 97.47
Total CHOL/HDL Ratio: 3
Triglycerides: 47 mg/dL (ref 0.0–149.0)
VLDL: 9.4 mg/dL (ref 0.0–40.0)

## 2023-03-02 LAB — HEMOGLOBIN A1C: Hgb A1c MFr Bld: 5.3 % (ref 4.6–6.5)

## 2023-03-03 ENCOUNTER — Other Ambulatory Visit: Payer: Self-pay | Admitting: Family Medicine

## 2023-03-03 MED ORDER — LEVOTHYROXINE SODIUM 100 MCG PO TABS: 100.0000 ug | ORAL_TABLET | Freq: Every day | ORAL | 0 refills | Status: DC

## 2023-03-14 ENCOUNTER — Telehealth: Payer: Self-pay

## 2023-03-15 NOTE — Telephone Encounter (Signed)
Opened by mistake.

## 2023-03-17 ENCOUNTER — Ambulatory Visit (AMBULATORY_SURGERY_CENTER): Payer: 59 | Admitting: *Deleted

## 2023-03-17 VITALS — Ht <= 58 in | Wt 215.0 lb

## 2023-03-17 DIAGNOSIS — Z85038 Personal history of other malignant neoplasm of large intestine: Secondary | ICD-10-CM

## 2023-03-17 DIAGNOSIS — Z800 Family history of malignant neoplasm of digestive organs: Secondary | ICD-10-CM

## 2023-03-17 MED ORDER — NA SULFATE-K SULFATE-MG SULF 17.5-3.13-1.6 GM/177ML PO SOLN: 1.0000 | Freq: Once | ORAL | 0 refills | Status: AC

## 2023-03-17 NOTE — Progress Notes (Signed)
Pt's name and DOB verified at the beginning of the pre-visit wit 2 identifiers  Pt denies any difficulty with ambulating,sitting, laying down or rolling side to side  Gave both [facility] main # and MD on call # prior to instructions.   No egg or soy allergy known to patient   No issues known to pt with past sedation with any surgeries or procedures  Pt denies having issues being intubated  Pt has no issues moving head neck or swallowing  No FH of Malignant Hyperthermia  Pt is not on diet pills or shots  Pt is not on home 02   Pt is not on blood thinners   Pt has frequent issues with constipation RN instructed pt to use Miralax per bottles instructions a week before prep days. Pt states they will  Pt is not on dialysis  Pt denise any abnormal heart rhythms   Pt denies any upcoming cardiac testing  Pt encouraged to use to use Singlecare or Goodrx to reduce cost   Patient's chart reviewed by Cathlyn Parsons CNRA prior to pre-visit and patient appropriate for the [facility].  Pre-visit completed and red dot placed by patient's name on their procedure day (on provider's schedule).  .  Visit by phone  Pt states weight is 215 lb  Instructed pt why it is important to and  to call if they have any changes in health or new medications. Directed them to the # given and on instructions.     Instructions reviewed with pt and pt states understanding. Instructed to review again prior to procedure. Pt states they will.   Instructions sent by mail with coupon and by my chart

## 2023-03-28 ENCOUNTER — Encounter: Payer: Self-pay | Admitting: Gastroenterology

## 2023-04-05 ENCOUNTER — Ambulatory Visit
Admission: RE | Admit: 2023-04-05 | Discharge: 2023-04-05 | Disposition: A | Discharge status: Home / Self Care | Payer: 59 | Source: Ambulatory Visit

## 2023-04-05 DIAGNOSIS — Z1231 Encounter for screening mammogram for malignant neoplasm of breast: Secondary | ICD-10-CM

## 2023-04-10 ENCOUNTER — Encounter: Payer: Self-pay | Admitting: Gastroenterology

## 2023-04-10 ENCOUNTER — Ambulatory Visit (AMBULATORY_SURGERY_CENTER): Payer: 59 | Admitting: Gastroenterology

## 2023-04-10 VITALS — BP 119/89 | HR 70 | Temp 98.6°F | Resp 13 | Ht <= 58 in | Wt 215.0 lb

## 2023-04-10 DIAGNOSIS — Z800 Family history of malignant neoplasm of digestive organs: Secondary | ICD-10-CM | POA: Diagnosis not present

## 2023-04-10 DIAGNOSIS — Z1211 Encounter for screening for malignant neoplasm of colon: Secondary | ICD-10-CM

## 2023-04-10 DIAGNOSIS — D123 Benign neoplasm of transverse colon: Secondary | ICD-10-CM | POA: Diagnosis not present

## 2023-04-10 MED ORDER — SODIUM CHLORIDE 0.9 % IV SOLN: 500.0000 mL | INTRAVENOUS | Status: AC

## 2023-04-10 NOTE — Progress Notes (Unsigned)
[facility] History and Physical   Primary Care Physician:  Natalia Leatherwood, DO   Reason for Procedure:  Family history of colon cancer  Plan:    Screening colonoscopy with possible interventions as needed     HPI: [name] is a very pleasant 47 y.o. female here for screening colonoscopy. Denies any nausea, vomiting, abdominal pain, melena or bright red blood per rectum  The risks and benefits as well as alternatives of endoscopic procedure(s) have been discussed and reviewed. All questions answered. The patient agrees to proceed.    Past Medical History:  Diagnosis Date   ADD (attention deficit disorder)    Angular cheilitis 03/13/2018   Anxiety    Arthritis    Chicken pox    Chronic neck pain    HNP   Depression    Headache    Heart murmur    as a baby   Insomnia    Nocturia    Sleep apnea    CPAP-SLEEP STUDY DONE ABOUT 2 YRS AGO   Thyroid disease    UTI (urinary tract infection)     Past Surgical History:  Procedure Laterality Date   ANTERIOR CERVICAL DECOMP/DISCECTOMY FUSION N/A 06/02/2014   Procedure: C6-7 Anterior Cervical Discectomy and Fusion, Allograft, Plate;  Surgeon: Eldred Manges, MD;  Location: [facility] OR;  Service: Orthopedics;  Laterality: N/A;   CESAREAN SECTION  2004/2007   DILATION AND CURETTAGE OF UTERUS  2018   wisdom teeth extracted       Prior to Admission medications   Medication Sig Start Date End Date Taking? Authorizing Provider  amphetamine-dextroamphetamine (ADDERALL) 20 MG tablet Take 1 tablet (20 mg total) by mouth 2 (two) times daily. 05/08/23  Yes Kuneff, Renee A, DO  levonorgestrel (MIRENA) 20 MCG/24HR IUD 1 each by Intrauterine route once.   Yes [provider]  levothyroxine (SYNTHROID) 100 MCG tablet Take 1 tablet (100 mcg total) by mouth daily before breakfast. 03/03/23  Yes Kuneff, Renee A, DO  venlafaxine XR (EFFEXOR XR) 150 MG 24 hr capsule Take 1 capsule (150 mg total) by mouth daily with  breakfast. 03/01/23  Yes Kuneff, Renee A, DO  amphetamine-dextroamphetamine (ADDERALL) 20 MG tablet Take 1 tablet (20 mg total) by mouth 2 (two) times daily. Patient not taking: Reported on 03/17/2023 03/01/23   Felix Pacini A, DO  levocetirizine (XYZAL) 5 MG tablet Take 1 tablet (5 mg total) by mouth every evening. Patient not taking: Reported on 03/17/2023 03/01/23   Natalia Leatherwood, DO    Current Outpatient Medications  Medication Sig Dispense Refill   [START ON 05/08/2023] amphetamine-dextroamphetamine (ADDERALL) 20 MG tablet Take 1 tablet (20 mg total) by mouth 2 (two) times daily. 180 tablet 0   levonorgestrel (MIRENA) 20 MCG/24HR IUD 1 each by Intrauterine route once.     levothyroxine (SYNTHROID) 100 MCG tablet Take 1 tablet (100 mcg total) by mouth daily before breakfast. 90 tablet 0   venlafaxine XR (EFFEXOR XR) 150 MG 24 hr capsule Take 1 capsule (150 mg total) by mouth daily with breakfast. 90 capsule 1   amphetamine-dextroamphetamine (ADDERALL) 20 MG tablet Take 1 tablet (20 mg total) by mouth 2 (two) times daily. (Patient not taking: Reported on 03/17/2023) 180 tablet 0   levocetirizine (XYZAL) 5 MG tablet Take 1 tablet (5 mg total) by mouth every evening. (Patient not taking: Reported on 03/17/2023) 90 tablet 3   Current Facility-Administered Medications  Medication Dose Route Frequency Provider Last Rate Last Admin  0.9 %  sodium chloride infusion  500 mL Intravenous Continuous Cap Massi, Eleonore Chiquito, MD        Allergies as of 04/10/2023 - Review Complete 04/10/2023  Allergen Reaction Noted   Ativan [lorazepam] Other (See Comments) 05/22/2014   Metformin and related  01/26/2022   Penicillins Rash 05/22/2014    Family History  Problem Relation Age of Onset   Arthritis Mother    Depression Mother    Drug abuse Mother    Hyperlipidemia Mother    Hypertension Mother    Alcohol abuse Father    Cancer - Cervical Sister    Colon cancer Maternal Grandmother    Breast cancer  Maternal Grandmother    Thyroid nodules Maternal Grandmother    Colon cancer Maternal Grandfather    Heart disease Paternal Grandmother    Alcohol abuse Paternal Grandfather    Early death Son    Colon polyps Neg Hx    Esophageal cancer Neg Hx    Stomach cancer Neg Hx    Rectal cancer Neg Hx     Social History   Socioeconomic History   Marital status: Divorced    Spouse name: Not on file   Number of children: 2   Years of education: 13   Highest education level: Some college, no degree  Occupational History   Occupation: Associate Professor  Tobacco Use   Smoking status: Never   Smokeless tobacco: Never  Vaping Use   Vaping status: Never Used  Substance and Sexual Activity   Alcohol use: No   Drug use: No   Sexual activity: Yes    Partners: Male    Birth control/protection: I.U.D.  Other Topics Concern   Not on file  Social History Narrative   Married.  2 children.   Some college.  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   Drinks caffeinated beverages.  Uses herbal remedies.   Smoke alarms in the home.  Wears her seatbelt.   Feels safe in her relationships.   Social Determinants of Health   Financial Resource Strain: Low Risk  (12/19/2022)   Overall Financial Resource Strain (CARDIA)    Difficulty of Paying Living Expenses: Not very hard  Food Insecurity: Unknown (12/19/2022)   Hunger Vital Sign    Worried About Running Out of Food in the Last Year: Never true    Ran Out of Food in the Last Year: Patient declined  Transportation Needs: No Transportation Needs (12/19/2022)   PRAPARE - Administrator, Civil Service (Medical): No    Lack of Transportation (Non-Medical): No  Physical Activity: Unknown (12/19/2022)   Exercise Vital Sign    Days of Exercise per Week: 2 days    Minutes of Exercise per Session: Patient declined  Stress: Patient Declined (12/19/2022)   [employer] of Occupational Health - Occupational Stress Questionnaire    Feeling of Stress : Patient declined   Social Connections: Unknown (12/19/2022)   Social Connection and Isolation Panel [NHANES]    Frequency of Communication with Friends and Family: Three times a week    Frequency of Social Gatherings with Friends and Family: Never    Attends Religious Services: Patient declined    Active Member of Clubs or Organizations: No    Attends Banker Meetings: Not on file    Marital Status: Divorced  Intimate Partner Violence: Unknown (09/15/2021)   Received from [employer], [facility]   HITS    Physically Hurt: Not on file    Insult or Talk  Down To: Not on file    Threaten Physical Harm: Not on file    Scream or Curse: Not on file    Review of Systems:  All other review of systems negative except as mentioned in the HPI.  Physical Exam: Vital signs in last 24 hours: BP 127/77   Pulse 86   Temp 98.6 F (37 C) (Skin)   Ht 4\' 10"  (1.473 m)   Wt 215 lb (97.5 kg)   SpO2 98%   BMI 44.94 kg/m  General:   Alert, NAD Lungs:  Clear .   Heart:  Regular rate and rhythm Abdomen:  Soft, nontender and nondistended. Neuro/Psych:  Alert and cooperative. Normal mood and affect. A and O x 3  Reviewed labs, radiology imaging, old records and pertinent past GI work up  Patient is appropriate for planned procedure(s) and anesthesia in an ambulatory setting   K. Scherry Ran , MD [phone]

## 2023-04-10 NOTE — Progress Notes (Unsigned)
VS by DT  Pt's states no medical or surgical changes since previsit or office visit.  

## 2023-04-10 NOTE — Patient Instructions (Signed)
Handouts on polyps, diverticulosis and hemorrhoids given.  Resume previous diet and continue present medications.  Awaiting pathology results.  YOU HAD AN ENDOSCOPIC PROCEDURE TODAY AT THE [facility]:   Refer to the procedure report that was given to you for any specific questions about what was found during the examination.  If the procedure report does not answer your questions, please call your gastroenterologist to clarify.  If you requested that your care partner not be given the details of your procedure findings, then the procedure report has been included in a sealed envelope for you to review at your convenience later.  YOU SHOULD EXPECT: Some feelings of bloating in the abdomen. Passage of more gas than usual.  Walking can help get rid of the air that was put into your GI tract during the procedure and reduce the bloating. If you had a lower endoscopy (such as a colonoscopy or flexible sigmoidoscopy) you may notice spotting of blood in your stool or on the toilet paper. If you underwent a bowel prep for your procedure, you may not have a normal bowel movement for a few days.  Please Note:  You might notice some irritation and congestion in your nose or some drainage.  This is from the oxygen used during your procedure.  There is no need for concern and it should clear up in a day or so.  SYMPTOMS TO REPORT IMMEDIATELY:  Following lower endoscopy (colonoscopy or flexible sigmoidoscopy):  Excessive amounts of blood in the stool  Significant tenderness or worsening of abdominal pains  Swelling of the abdomen that is new, acute  Fever of 100F or higher   For urgent or emergent issues, a gastroenterologist can be reached at any hour by calling [phone]. Do not use MyChart messaging for urgent concerns.    DIET:  We do recommend a small meal at first, but then you may proceed to your regular diet.  Drink plenty of fluids but you should avoid alcoholic beverages for  24 hours.  ACTIVITY:  You should plan to take it easy for the rest of today and you should NOT DRIVE or use heavy machinery until tomorrow (because of the sedation medicines used during the test).    FOLLOW UP: Our staff will call the number listed on your records the next business day following your procedure.  We will call around 7:15- 8:00 am to check on you and address any questions or concerns that you may have regarding the information given to you following your procedure. If we do not reach you, we will leave a message.     If any biopsies were taken you will be contacted by phone or by letter within the next 1-3 weeks.  Please call us at [phone] if you have not heard about the biopsies in 3 weeks.    SIGNATURES/CONFIDENTIALITY: You and/or your care partner have signed paperwork which will be entered into your electronic medical record.  These signatures attest to the fact that that the information above on your After Visit Summary has been reviewed and is understood.  Full responsibility of the confidentiality of this discharge information lies with you and/or your care-partner.

## 2023-04-10 NOTE — Progress Notes (Signed)
Pt resting comfortably. VSS. Airway intact. SBAR complete to RN. All questions answered.   

## 2023-04-10 NOTE — Op Note (Signed)
[facility] Patient Name: [name] Procedure Date: 04/10/2023 2:09 PM MRN: [id number] Endoscopist: Napoleon Form , MD, [id number] Age: 47 Referring MD:  Date of Birth: [birth year] Gender: Female Account #: [id number] Procedure:                Colonoscopy Indications:              Screening in patient at increased risk: Family                            history of 1st-degree relative with colorectal                            cancer Medicines:                Monitored Anesthesia Care Procedure:                Pre-Anesthesia Assessment:                           - Prior to the procedure, a History and Physical                            was performed, and patient medications and                            allergies were reviewed. The patient's tolerance of                            previous anesthesia was also reviewed. The risks                            and benefits of the procedure and the sedation                            options and risks were discussed with the patient.                            All questions were answered, and informed consent                            was obtained. Prior Anticoagulants: The patient has                            taken no anticoagulant or antiplatelet agents. ASA                            Grade Assessment: II - A patient with mild systemic                            disease. After reviewing the risks and benefits,                            the patient was deemed in satisfactory condition to  undergo the procedure.                           After obtaining informed consent, the colonoscope                            was passed under direct vision. Throughout the                            procedure, the patient's blood pressure, pulse, and                            oxygen saturations were monitored continuously. The                            Olympus Scope SN [id number] was introduced through  the                            anus and advanced to the the cecum, identified by                            appendiceal orifice and ileocecal valve. The                            colonoscopy was performed without difficulty. The                            patient tolerated the procedure well. The quality                            of the bowel preparation was good. The ileocecal                            valve, appendiceal orifice, and rectum were                            photographed. Scope In: 2:19:49 PM Scope Out: 2:38:58 PM Scope Withdrawal Time: 0 hours 12 minutes 59 seconds  Total Procedure Duration: 0 hours 19 minutes 9 seconds  Findings:                 The perianal and digital rectal examinations were                            normal.                           A 4 mm polyp was found in the transverse colon. The                            polyp was sessile. The polyp was removed with a                            cold snare. Resection and retrieval were complete.  Scattered small-mouthed diverticula were found in                            the sigmoid colon, descending colon, transverse                            colon and ascending colon.                           Non-bleeding internal hemorrhoids were found during                            retroflexion. The hemorrhoids were small. Complications:            No immediate complications. Estimated Blood Loss:     Estimated blood loss was minimal. Impression:               - One 4 mm polyp in the transverse colon, removed                            with a cold snare. Resected and retrieved.                           - Mild diverticulosis in the sigmoid colon, in the                            descending colon, in the transverse colon and in                            the ascending colon.                           - Non-bleeding internal hemorrhoids. Recommendation:           - Patient has a contact  number available for                            emergencies. The signs and symptoms of potential                            delayed complications were discussed with the                            patient. Return to normal activities tomorrow.                            Written discharge instructions were provided to the                            patient.                           - Resume previous diet.                           - Continue present medications.                           -  Await pathology results.                           - Repeat colonoscopy in 5 years for surveillance                            based on pathology results. Napoleon Form, MD 04/10/2023 2:43:16 PM This report has been signed electronically.

## 2023-04-10 NOTE — Progress Notes (Signed)
Called to room to assist during endoscopic procedure.  Patient ID and intended procedure confirmed with present staff. Received instructions for my participation in the procedure from the performing physician.  

## 2023-04-11 ENCOUNTER — Telehealth: Payer: Self-pay | Admitting: *Deleted

## 2023-04-11 NOTE — Telephone Encounter (Signed)
  Follow up Call-     04/10/2023    1:07 PM  Call back number  Post procedure Call Back phone  # [phone]  Permission to leave phone message Yes     Patient questions:  Do you have a fever, pain , or abdominal swelling? No. Pain Score  0 *  Have you tolerated food without any problems? Yes.    Have you been able to return to your normal activities? Yes.    Do you have any questions about your discharge instructions: Diet   No. Medications  No. Follow up visit  No.  Do you have questions or concerns about your Care? No.  Actions: * If pain score is 4 or above: No action needed, pain <4.

## 2023-04-13 LAB — SURGICAL PATHOLOGY

## 2023-04-18 ENCOUNTER — Encounter: Payer: Self-pay | Admitting: Gastroenterology

## 2023-05-22 ENCOUNTER — Other Ambulatory Visit: Payer: Self-pay | Admitting: Family Medicine

## 2023-08-16 ENCOUNTER — Ambulatory Visit: Payer: 59 | Admitting: Family Medicine

## 2023-08-23 ENCOUNTER — Encounter: Payer: Self-pay | Admitting: Family Medicine

## 2023-08-23 ENCOUNTER — Ambulatory Visit: Payer: 59 | Admitting: Family Medicine

## 2023-08-23 VITALS — BP 122/84 | HR 82 | Temp 98.4°F | Wt 217.0 lb

## 2023-08-23 DIAGNOSIS — R7303 Prediabetes: Secondary | ICD-10-CM | POA: Diagnosis not present

## 2023-08-23 DIAGNOSIS — F902 Attention-deficit hyperactivity disorder, combined type: Secondary | ICD-10-CM

## 2023-08-23 DIAGNOSIS — M5136 Other intervertebral disc degeneration, lumbar region with discogenic back pain only: Secondary | ICD-10-CM | POA: Insufficient documentation

## 2023-08-23 DIAGNOSIS — F320 Major depressive disorder, single episode, mild: Secondary | ICD-10-CM

## 2023-08-23 DIAGNOSIS — E063 Autoimmune thyroiditis: Secondary | ICD-10-CM

## 2023-08-23 MED ORDER — MELOXICAM 15 MG PO TABS: 15.0000 mg | ORAL_TABLET | Freq: Every day | ORAL | 1 refills | Status: DC

## 2023-08-23 MED ORDER — LEVOTHYROXINE SODIUM 100 MCG PO TABS: 100.0000 ug | ORAL_TABLET | Freq: Every day | ORAL | 3 refills | Status: DC

## 2023-08-23 MED ORDER — AMPHETAMINE-DEXTROAMPHETAMINE 20 MG PO TABS: 20.0000 mg | ORAL_TABLET | Freq: Two times a day (BID) | ORAL | 0 refills | Status: DC

## 2023-08-23 MED ORDER — VENLAFAXINE HCL ER 150 MG PO CP24: 150.0000 mg | ORAL_CAPSULE | Freq: Every day | ORAL | 1 refills | Status: DC

## 2023-08-23 NOTE — Patient Instructions (Addendum)
 Return in about 6 months (around 03/01/2024) for cpe (20 min), Routine chronic condition follow-up.        Great to see you today.  I have refilled the medication(s) we provide.   If labs were collected or images ordered, we will inform you of  results once we have received them and reviewed. We will contact you either by echart message, or telephone call.  Please give ample time to the testing facility, and our office to run,  receive and review results. Please do not call inquiring of results, even if you can see them in your chart. We will contact you as soon as we are able. If it has been over 1 week since the test was completed, and you have not yet heard from [place], then please call us.    - echart message- for normal results that have been seen by the patient already.   - telephone call: abnormal results or if patient has not viewed results in their echart.  If a referral to a specialist was entered for you, please call us in 2 weeks if you have not heard from the specialist office to schedule.

## 2023-08-23 NOTE — Progress Notes (Addendum)
 Patient ID: [name], female  DOB: Jun 11, 1976, 48 y.o.   MRN: [id number] Patient Care Team    Relationship Specialty Notifications Start End  Natalia Leatherwood, DO PCP - General Family Medicine  10/03/17   Peterson Ao, MD Referring Physician Internal Medicine  10/04/17   Tobias Alexander, OD Referring Physician Optometry  10/04/17   Consuello Closs., MD Referring Physician Obstetrics and Gynecology  10/04/17   Windle Guard, MD Consulting Physician Anesthesiology  08/23/23    Comment: Orthopedic    Chief Complaint  Patient presents with   Depression    Subjective: [name] is a 48 y.o.  Female  present for Chronic Conditions/illness Management  All past medical history, surgical history, allergies, family history, immunizations, medications and social history were updated in the electronic medical record today. All recent labs, ED visits and hospitalizations within the last year were reviewed.   Depression/ADD: Patient reports compliance w/ Effexor 150 mg daily.  Her ADD is well-managed on adderall 20 mg BID, however she does not feel it is as well-controlled as before. She reports she has been on multiple different medications with either side effects or  failure with Cymbalta, Lexapro, Trintellex, Brintellix, vybrid (nightmares), Zoloft(wt) and Paxil.   Indication for controlled substance: ADD Medication and dose: Adderall 15 mg twice daily # pills per month: 60/mo> refill 90 day at a time.  Last UDS date: collected today UDS UTD contract signed (Y/N): Y- Date narcotic database last reviewed (include red flags): 08/23/2023  Hypothyroidism: Patient reports compliance with levothyroxine 100 mcg.  This was not increased due to abnormal labs in September with a TSH of approximately 9.  She did not follow-up for retesting.     08/23/2023   11:05 AM 03/01/2023    2:35 PM 12/19/2022    2:32 PM 07/20/2022    9:59 AM 01/26/2022    1:34 PM  Depression screen PHQ 2/9   Decreased Interest 0 0 0 0 0  Down, Depressed, Hopeless 0 0 0 0 0  PHQ - 2 Score 0 0 0 0 0  Altered sleeping 0 1 0 0 2  Tired, decreased energy 1 0 0 0 1  Change in appetite 2 0 0 1 1  Feeling bad or failure about yourself  0 0 0 0 0  Trouble concentrating 1 0 0 1 1  Moving slowly or fidgety/restless 0 0 0 0 0  Suicidal thoughts 0 0 0 0 0  PHQ-9 Score 4 1 0 2 5  Difficult doing work/chores Not difficult at all Not difficult at all  Not difficult at all Somewhat difficult      08/23/2023   11:05 AM 03/01/2023    2:35 PM 07/20/2022    9:59 AM 01/26/2022    1:35 PM  GAD 7 : Generalized Anxiety Score  Nervous, Anxious, on Edge 0 0 0 0  Control/stop worrying 0 0 0 0  Worry too much - different things 0 1 0 0  Trouble relaxing 0 0 0 1  Restless 0 0 0 1  Easily annoyed or irritable 0 0 0 0  Afraid - awful might happen 0 0 0 0  Total GAD 7 Score 0 1 0 2  Anxiety Difficulty Not difficult at all Not difficult at all  Not difficult at all    Immunization History  Administered Date(s) Administered   Influenza Inj Mdck Quad With Preservative 03/14/2014   Influenza Split 04/05/2011   Influenza, Seasonal,  Injecte, Preservative Fre 03/13/2010, 04/05/2011   Influenza,inj,Quad PF,6+ Mos 06/02/2020   Influenza-Unspecified 04/05/2011, 03/14/2014, 03/18/2017   PFIZER(Purple Top)SARS-COV-2 Vaccination 07/24/2019, 08/21/2019, 08/29/2020   Tdap 04/05/2011, 08/19/2020     Past Medical History:  Diagnosis Date   ADD (attention deficit disorder)    Angular cheilitis 03/13/2018   Anxiety    Arthritis    Chicken pox    Chronic neck pain    HNP   Depression    Headache    Heart murmur    as a baby   Insomnia    Nocturia    Sleep apnea    CPAP-SLEEP STUDY DONE ABOUT 2 YRS AGO   Thyroid disease    UTI (urinary tract infection)    Allergies  Allergen Reactions   Ativan [Lorazepam] Other (See Comments)    hyperactivity   Metformin And Related    Penicillins Rash   Past Surgical  History:  Procedure Laterality Date   ANTERIOR CERVICAL DECOMP/DISCECTOMY FUSION N/A 06/02/2014   Procedure: C6-7 Anterior Cervical Discectomy and Fusion, Allograft, Plate;  Surgeon: Eldred Manges, MD;  Location: [facility] OR;  Service: Orthopedics;  Laterality: N/A;   CESAREAN SECTION  2004/2007   DILATION AND CURETTAGE OF UTERUS  2018   wisdom teeth extracted      Family History  Problem Relation Age of Onset   Arthritis Mother    Depression Mother    Drug abuse Mother    Hyperlipidemia Mother    Hypertension Mother    Alcohol abuse Father    Cancer - Cervical Sister    Colon cancer Maternal Grandmother    Breast cancer Maternal Grandmother    Thyroid nodules Maternal Grandmother    Colon cancer Maternal Grandfather    Heart disease Paternal Grandmother    Alcohol abuse Paternal Grandfather    Early death Son    Colon polyps Neg Hx    Esophageal cancer Neg Hx    Stomach cancer Neg Hx    Rectal cancer Neg Hx    Social History   Social History Narrative   Married.  2 children.   Some college.  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   Drinks caffeinated beverages.  Uses herbal remedies.   Smoke alarms in the home.  Wears her seatbelt.   Feels safe in her relationships.    Allergies as of 08/23/2023       Reactions   Ativan [lorazepam] Other (See Comments)   hyperactivity   Metformin And Related    Penicillins Rash        Medication List        Accurate as of August 23, 2023 12:37 PM. If you have any questions, ask your nurse or doctor.          STOP taking these medications    gabapentin 300 MG capsule Commonly known as: NEURONTIN Stopped by: Felix Pacini   levocetirizine 5 MG tablet Commonly known as: Xyzal Stopped by: Felix Pacini       TAKE these medications    amphetamine-dextroamphetamine 20 MG tablet Commonly known as: ADDERALL Take 1 tablet (20 mg total) by mouth 2 (two) times daily. What changed: Another medication with the same name was changed. Make sure  you understand how and when to take each. Changed by: Felix Pacini   amphetamine-dextroamphetamine 20 MG tablet Commonly known as: ADDERALL Take 1 tablet (20 mg total) by mouth 2 (two) times daily. Start taking on: Nov 01, 2023 What changed: These instructions start on Nov 01, 2023. If you are unsure what to do until then, ask your doctor or other care provider. Changed by: Felix Pacini   levonorgestrel 20 MCG/24HR IUD Commonly known as: MIRENA 1 each by Intrauterine route once.   levothyroxine 100 MCG tablet Commonly known as: SYNTHROID Take 1 tablet (100 mcg total) by mouth daily before breakfast. What changed: See the new instructions. Changed by: Felix Pacini   meloxicam 15 MG tablet Commonly known as: MOBIC Take 1 tablet (15 mg total) by mouth daily. With food Started by: Felix Pacini   venlafaxine XR 150 MG 24 hr capsule Commonly known as: Effexor XR Take 1 capsule (150 mg total) by mouth daily with breakfast.        All past medical history, surgical history, allergies, family history, immunizations andmedications were updated in the EMR today and reviewed under the history and medication portions of their EMR.     No results found for this or any previous visit (from the past 2160 hours).   No results found.   ROS 14 pt review of systems performed and negative (unless mentioned in an HPI)  Objective: BP 122/84   Pulse 82   Temp 98.4 F (36.9 C)   Wt 217 lb (98.4 kg)   SpO2 98%   BMI 45.35 kg/m  Physical Exam Vitals and nursing note reviewed.  Constitutional:      General: She is not in acute distress.    Appearance: Normal appearance. She is not ill-appearing, toxic-appearing or diaphoretic.  HENT:     Head: Normocephalic and atraumatic.  Eyes:     General: No scleral icterus.       Right eye: No discharge.        Left eye: No discharge.     Extraocular Movements: Extraocular movements intact.     Conjunctiva/sclera: Conjunctivae normal.      Pupils: Pupils are equal, round, and reactive to light.  Cardiovascular:     Rate and Rhythm: Normal rate and regular rhythm.  Pulmonary:     Effort: Pulmonary effort is normal. No respiratory distress.     Breath sounds: Normal breath sounds. No wheezing, rhonchi or rales.  Musculoskeletal:     Right lower leg: No edema.     Left lower leg: No edema.  Skin:    General: Skin is warm.     Findings: No rash.  Neurological:     Mental Status: She is alert and oriented to person, place, and time. Mental status is at baseline.     Motor: No weakness.     Gait: Gait normal.  Psychiatric:        Mood and Affect: Mood normal.        Behavior: Behavior normal.        Thought Content: Thought content normal.        Judgment: Judgment normal.      No results found.  Assessment/plan: Floride Hutmacher is a 48 y.o. female present for chronic condition management Depression, major, single episode, mild (HCC) Stable Continue Effexor 150 mg daily daily  ADHD:  Stable Continue Adderall 20 mg twice daily (90d x2)- may take 1/2-1 tab by noon, NCCS database reviewed 08/23/23 Controlled substance contract in place F/u q 5.5 mos   Hypothyroidism: TSH, T4 free and T3 free collected today Continue levothyroxine 100 mcg qd for now.  Dose had been increased last visit secondary to elevated TSH from 9  Degenerative lumbar disc: Patient had seen orthopedic, would like to consider  neck step that is not surgical.  Do not have a copy of x-rays, patient states that it was degenerative lumbar around L5 and L4 We discussed anti-inflammatory start which she is agreeable along with physical therapy. Referral placed for physical therapy today. Mobic 15 mg daily with food prescribed   Return in about 6 months (around 03/01/2024) for cpe (20 min), Routine chronic condition follow-up.  Orders Placed This Encounter  Procedures   TSH   T4, free   T3, free   Ambulatory referral to Physical Therapy     Orders Placed This Encounter  Procedures   TSH   T4, free   T3, free   Ambulatory referral to Physical Therapy   Meds ordered this encounter  Medications   amphetamine-dextroamphetamine (ADDERALL) 20 MG tablet    Sig: Take 1 tablet (20 mg total) by mouth 2 (two) times daily.    Dispense:  180 tablet    Refill:  0   levothyroxine (SYNTHROID) 100 MCG tablet    Sig: Take 1 tablet (100 mcg total) by mouth daily before breakfast.    Dispense:  90 tablet    Refill:  3   venlafaxine XR (EFFEXOR XR) 150 MG 24 hr capsule    Sig: Take 1 capsule (150 mg total) by mouth daily with breakfast.    Dispense:  90 capsule    Refill:  1   amphetamine-dextroamphetamine (ADDERALL) 20 MG tablet    Sig: Take 1 tablet (20 mg total) by mouth 2 (two) times daily.    Dispense:  180 tablet    Refill:  0   meloxicam (MOBIC) 15 MG tablet    Sig: Take 1 tablet (15 mg total) by mouth daily. With food    Dispense:  90 tablet    Refill:  1   Referral Orders         Ambulatory referral to Physical Therapy         Electronically signed by: Felix Pacini, DO [facility] [place]

## 2023-08-24 LAB — T4, FREE: Free T4: 0.99 ng/dL (ref 0.60–1.60)

## 2023-08-24 LAB — TSH: TSH: 2.45 u[IU]/mL (ref 0.35–5.50)

## 2023-08-24 LAB — T3, FREE: T3, Free: 3.2 pg/mL (ref 2.3–4.2)

## 2023-08-28 ENCOUNTER — Encounter: Payer: Self-pay | Admitting: Family Medicine

## 2023-10-04 ENCOUNTER — Encounter: Payer: Self-pay | Admitting: Family Medicine

## 2023-10-05 NOTE — Telephone Encounter (Signed)
 Faxed document FMLA, to be filled out by provider. Patient requested to send it back via Fax within 7-days. Document is located in providers tray at front office. Pt scheduled for 10/06/23

## 2023-10-05 NOTE — Telephone Encounter (Signed)
 Pt has appt scheduled for tomorrow, 4/24.

## 2023-10-05 NOTE — Telephone Encounter (Signed)
 Pt has not been seen for the reasons she is requesting FMLA to be completed She will need an appt before any paperwork could potentially be filled out, if appropriate.

## 2023-10-05 NOTE — Telephone Encounter (Signed)
Placed in PCP office for review/signature

## 2023-10-06 ENCOUNTER — Telehealth: Payer: Self-pay | Admitting: Family Medicine

## 2023-10-06 ENCOUNTER — Encounter: Payer: Self-pay | Admitting: Family Medicine

## 2023-10-06 ENCOUNTER — Telehealth: Admitting: Family Medicine

## 2023-10-06 DIAGNOSIS — F320 Major depressive disorder, single episode, mild: Secondary | ICD-10-CM

## 2023-10-06 DIAGNOSIS — F419 Anxiety disorder, unspecified: Secondary | ICD-10-CM

## 2023-10-06 MED ORDER — BUSPIRONE HCL 10 MG PO TABS: 10.0000 mg | ORAL_TABLET | Freq: Two times a day (BID) | ORAL | 5 refills | Status: DC

## 2023-10-06 NOTE — Patient Instructions (Signed)

## 2023-10-06 NOTE — Telephone Encounter (Signed)
 Forms faxed

## 2023-10-06 NOTE — Telephone Encounter (Signed)
 No further action needed.

## 2023-10-06 NOTE — Telephone Encounter (Signed)
 Completed behavioral health statement. Please complete fax number address etc. and fax today. Placed in [employer] work [employer], broadcasting/film/video

## 2023-10-06 NOTE — Progress Notes (Addendum)
 VIRTUAL VISIT VIA VIDEO  I connected with [name] on 10/06/23 at  1:20 PM EDT by a video enabled telemedicine application and verified that I am speaking with the correct person using two identifiers. Location patient: Home Location provider: [facility], Office Persons participating in the virtual visit: Patient, Dr. Marylee Snowball and Pressley Brome, CMA  I discussed the limitations of evaluation and management by telemedicine and the availability of in person appointments. The patient expressed understanding and agreed to proceed.     [name] , September 10, 1975, 48 y.o., female MRN: [id number] Patient Care Team    Relationship Specialty Notifications Start End  Mariel Shope, DO PCP - General Family Medicine  10/03/17   Avie Lemme, MD Referring Physician Internal Medicine  10/04/17   Sandor Crosser, OD Referring Physician Optometry  10/04/17   Emeterio Hansen., MD Referring Physician Obstetrics and Gynecology  10/04/17   Michele Ahle, MD Consulting Physician Anesthesiology  08/23/23    Comment: Orthopedic    Chief Complaint  Patient presents with   Completion of Forms   Anxiety     Subjective: [name] is a 48 y.o. Pt presents for an OV with complaints of increased anxiety of 2 weeks duration.   Patient is requesting completion of paperwork surrounding statement of behavioral health, and requesting time off work.  Reports she has had a lot of stressors occurring recently is difficult for her to work through this finding.  She is feeling less motivated and depressed, as well as anxious. She states a lot of her feelings are surrounding her work environment.  She is being bullied by her coworkers.  She reports she has reported this to her supervisor and they are working through documenting occurrences.  She feels this process is moving very slowly and is causing her increased stress. Her parents have also had significant health problems for the  last month.  Mother has been to the emergency room multiple occasions over the last 4 weeks.  Her father fell and recently had to be seen in the emergency room.  Significant other's mother was recently diagnosed with late stage cancer. She feels life is overwhelming right now.  She reports she left early Wednesday 10/04/2023: Because she "did not deal with it."She reports this was surrounding coworkers.  She did go to work the following day.  She follow-up today secondary to anxiety and father being in the emergency room. Patient currently is prescribed Effexor  150 mg daily.  She is interested in oral medications to help with increased anxiety.     08/23/2023   11:05 AM 03/01/2023    2:35 PM 12/19/2022    2:32 PM 07/20/2022    9:59 AM 01/26/2022    1:34 PM  Depression screen PHQ 2/9  Decreased Interest 0 0 0 0 0  Down, Depressed, Hopeless 0 0 0 0 0  PHQ - 2 Score 0 0 0 0 0  Altered sleeping 0 1 0 0 2  Tired, decreased energy 1 0 0 0 1  Change in appetite 2 0 0 1 1  Feeling bad or failure about yourself  0 0 0 0 0  Trouble concentrating 1 0 0 1 1  Moving slowly or fidgety/restless 0 0 0 0 0  Suicidal thoughts 0 0 0 0 0  PHQ-9 Score 4 1 0 2 5  Difficult doing work/chores Not difficult at all Not difficult at all  Not difficult at  all Somewhat difficult    Allergies  Allergen Reactions   Ativan [Lorazepam] Other (See Comments)    hyperactivity   Metformin  And Related    Penicillins Rash   Social History   Social History Narrative   Married.  2 children.   Some college.  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   Drinks caffeinated beverages.  Uses herbal remedies.   Smoke alarms in the home.  Wears her seatbelt.   Feels safe in her relationships.   Past Medical History:  Diagnosis Date   ADD (attention deficit disorder)    Angular cheilitis 03/13/2018   Anxiety    Arthritis    Chicken pox    Chronic neck pain    HNP   Depression    Headache    Heart murmur    as a baby   Insomnia     Nocturia    Sleep apnea    CPAP-SLEEP STUDY DONE ABOUT 2 YRS AGO   Thyroid  disease    UTI (urinary tract infection)    Past Surgical History:  Procedure Laterality Date   ANTERIOR CERVICAL DECOMP/DISCECTOMY FUSION N/A 06/02/2014   Procedure: C6-7 Anterior Cervical Discectomy and Fusion, Allograft, Plate;  Surgeon: Adah Acron, MD;  Location: [facility] OR;  Service: Orthopedics;  Laterality: N/A;   CESAREAN SECTION  2004/2007   DILATION AND CURETTAGE OF UTERUS  2018   wisdom teeth extracted      Family History  Problem Relation Age of Onset   Arthritis Mother    Depression Mother    Drug abuse Mother    Hyperlipidemia Mother    Hypertension Mother    Alcohol abuse Father    Cancer - Cervical Sister    Colon cancer Maternal Grandmother    Breast cancer Maternal Grandmother    Thyroid  nodules Maternal Grandmother    Colon cancer Maternal Grandfather    Heart disease Paternal Grandmother    Alcohol abuse Paternal Grandfather    Early death Son    Colon polyps Neg Hx    Esophageal cancer Neg Hx    Stomach cancer Neg Hx    Rectal cancer Neg Hx    Allergies as of 10/06/2023       Reactions   Ativan [lorazepam] Other (See Comments)   hyperactivity   Metformin  And Related    Penicillins Rash        Medication List        Accurate as of October 06, 2023  3:40 PM. If you have any questions, ask your nurse or doctor.          amphetamine -dextroamphetamine  20 MG tablet Commonly known as: ADDERALL Take 1 tablet (20 mg total) by mouth 2 (two) times daily.   amphetamine -dextroamphetamine  20 MG tablet Commonly known as: ADDERALL Take 1 tablet (20 mg total) by mouth 2 (two) times daily. Start taking on: Nov 01, 2023   busPIRone 10 MG tablet Commonly known as: BUSPAR Take 1 tablet (10 mg total) by mouth 2 (two) times daily. Started by: Aphrodite Harpenau   levonorgestrel 20 MCG/24HR IUD Commonly known as: MIRENA 1 each by Intrauterine route once.   levothyroxine  100 MCG  tablet Commonly known as: SYNTHROID  Take 1 tablet (100 mcg total) by mouth daily before breakfast.   meloxicam  15 MG tablet Commonly known as: MOBIC  Take 1 tablet (15 mg total) by mouth daily. With food   venlafaxine  XR 150 MG 24 hr capsule Commonly known as: Effexor  XR Take 1 capsule (150 mg total) by  mouth daily with breakfast.        All past medical history, surgical history, allergies, family history, immunizations andmedications were updated in the EMR today and reviewed under the history and medication portions of their EMR.     ROS Negative, with the exception of above mentioned in HPI   Objective:  There were no vitals taken for this visit. There is no height or weight on file to calculate BMI.  Physical Exam Vitals and nursing note reviewed.  Constitutional:      General: She is not in acute distress.    Appearance: Normal appearance. She is normal weight. She is not ill-appearing or toxic-appearing.  HENT:     Head: Normocephalic and atraumatic.  Eyes:     General: No scleral icterus.       Right eye: No discharge.        Left eye: No discharge.     Extraocular Movements: Extraocular movements intact.     Conjunctiva/sclera: Conjunctivae normal.     Pupils: Pupils are equal, round, and reactive to light.  Skin:    Findings: No rash.  Neurological:     Mental Status: She is alert and oriented to person, place, and time. Mental status is at baseline.     Motor: No weakness.     Coordination: Coordination normal.     Gait: Gait normal.  Psychiatric:        Attention and Perception: Attention normal.        Mood and Affect: Mood is anxious.        Speech: Speech normal.        Behavior: Behavior normal. Behavior is cooperative.        Thought Content: Thought content normal. Thought content is not paranoid. Thought content does not include homicidal or suicidal ideation.        Cognition and Memory: Cognition normal.        Judgment: Judgment normal.       No results found. No results found. No results found for this or any previous visit (from the past 24 hours).  Assessment/Plan: Oney Tatlock is a 48 y.o. female present for OV for  Depression with anxiety: Situational stressors causing increase in depression and anxiety. We discussed taking approximately 2 weeks off of work to allow her time to regroup. Patient has declined referral Continue Effexor  150 mg daily. Start BuSpar taper to 10 mg twice daily Paperwork will be filled out with effective dates starting 10/06/2023-with return to work on May 05/2024 Follow-up 4 weeks, sooner if needed Reviewed expectations re: course of current medical issues. Discussed self-management of symptoms. Outlined signs and symptoms indicating need for more acute intervention. Patient verbalized understanding and all questions were answered. Patient received an After-Visit Summary.    No orders of the defined types were placed in this encounter.  Meds ordered this encounter  Medications   busPIRone (BUSPAR) 10 MG tablet    Sig: Take 1 tablet (10 mg total) by mouth 2 (two) times daily.    Dispense:  60 tablet    Refill:  5   Referral Orders  No referral(s) requested today     Note is dictated utilizing voice recognition software. Although note has been proof read prior to signing, occasional typographical errors still can be missed. If any questions arise, please do not hesitate to call for verification.   electronically signed by:  Napolean Backbone, DO  [facility] - [place]

## 2023-10-09 ENCOUNTER — Ambulatory Visit: Admitting: Family Medicine

## 2023-10-10 ENCOUNTER — Ambulatory Visit: Admitting: Family Medicine

## 2023-10-18 ENCOUNTER — Telehealth: Payer: Self-pay

## 2023-10-18 ENCOUNTER — Encounter: Payer: Self-pay | Admitting: Family Medicine

## 2023-10-18 NOTE — Telephone Encounter (Signed)
 Received Physicians Statement Form to be filled out by provider. Placed in PCP office for review & signature.

## 2023-10-19 NOTE — Telephone Encounter (Signed)
 Noted.

## 2023-10-19 NOTE — Telephone Encounter (Signed)
 FYI  sent message to patient today asking if she knows why we received repeat paperwork for her?  This paperwork had been completed already approximately 2 weeks ago and it is the same exact paperwork.  Awaiting to hear back from patient.

## 2023-12-03 ENCOUNTER — Encounter: Payer: Self-pay | Admitting: Family Medicine

## 2024-01-09 ENCOUNTER — Encounter: Payer: Self-pay | Admitting: Family Medicine

## 2024-01-09 NOTE — Telephone Encounter (Signed)
 printed

## 2024-02-29 ENCOUNTER — Other Ambulatory Visit: Payer: Self-pay | Admitting: Family Medicine

## 2024-03-01 ENCOUNTER — Encounter: Payer: Self-pay | Admitting: Family Medicine

## 2024-03-01 ENCOUNTER — Encounter: Admitting: Family Medicine

## 2024-03-01 NOTE — Telephone Encounter (Signed)
 This prescription was denied yesterday by this provider.  She is overdue for a face-to-face visit which is required by law in order to refill a controlled substance, such as Adderall.

## 2024-03-14 ENCOUNTER — Ambulatory Visit: Admitting: Family Medicine

## 2024-03-14 ENCOUNTER — Encounter: Payer: Self-pay | Admitting: Family Medicine

## 2024-03-14 VITALS — BP 122/82 | HR 73 | Temp 98.3°F | Ht <= 58 in | Wt 219.4 lb

## 2024-03-14 DIAGNOSIS — F419 Anxiety disorder, unspecified: Secondary | ICD-10-CM

## 2024-03-14 DIAGNOSIS — Z1231 Encounter for screening mammogram for malignant neoplasm of breast: Secondary | ICD-10-CM

## 2024-03-14 DIAGNOSIS — R7303 Prediabetes: Secondary | ICD-10-CM | POA: Diagnosis not present

## 2024-03-14 DIAGNOSIS — F320 Major depressive disorder, single episode, mild: Secondary | ICD-10-CM

## 2024-03-14 DIAGNOSIS — Z0000 Encounter for general adult medical examination without abnormal findings: Secondary | ICD-10-CM | POA: Diagnosis not present

## 2024-03-14 DIAGNOSIS — Z6841 Body Mass Index (BMI) 40.0 and over, adult: Secondary | ICD-10-CM | POA: Diagnosis not present

## 2024-03-14 DIAGNOSIS — M47819 Spondylosis without myelopathy or radiculopathy, site unspecified: Secondary | ICD-10-CM | POA: Diagnosis not present

## 2024-03-14 DIAGNOSIS — F902 Attention-deficit hyperactivity disorder, combined type: Secondary | ICD-10-CM

## 2024-03-14 DIAGNOSIS — G8929 Other chronic pain: Secondary | ICD-10-CM | POA: Diagnosis not present

## 2024-03-14 DIAGNOSIS — M545 Low back pain, unspecified: Secondary | ICD-10-CM | POA: Diagnosis not present

## 2024-03-14 DIAGNOSIS — E063 Autoimmune thyroiditis: Secondary | ICD-10-CM

## 2024-03-14 DIAGNOSIS — Z23 Encounter for immunization: Secondary | ICD-10-CM

## 2024-03-14 DIAGNOSIS — Z13220 Encounter for screening for lipoid disorders: Secondary | ICD-10-CM

## 2024-03-14 LAB — COMPREHENSIVE METABOLIC PANEL WITH GFR
ALT: 15 U/L (ref 0–35)
AST: 14 U/L (ref 0–37)
Albumin: 4.1 g/dL (ref 3.5–5.2)
Alkaline Phosphatase: 77 U/L (ref 39–117)
BUN: 12 mg/dL (ref 6–23)
CO2: 29 meq/L (ref 19–32)
Calcium: 9 mg/dL (ref 8.4–10.5)
Chloride: 104 meq/L (ref 96–112)
Creatinine, Ser: 0.73 mg/dL (ref 0.40–1.20)
GFR: 98.4 mL/min (ref >60.00)
Glucose, Bld: 89 mg/dL (ref 70–99)
Potassium: 4.5 meq/L (ref 3.5–5.1)
Sodium: 140 meq/L (ref 135–145)
Total Bilirubin: 1 mg/dL (ref 0.2–1.2)
Total Protein: 6.7 g/dL (ref 6.0–8.3)

## 2024-03-14 LAB — LIPID PANEL
Cholesterol: 157 mg/dL (ref 0–200)
HDL: 56.1 mg/dL (ref >39.00)
LDL Cholesterol: 92 mg/dL (ref 0–99)
NonHDL: 101.2
Total CHOL/HDL Ratio: 3
Triglycerides: 44 mg/dL (ref 0.0–149.0)
VLDL: 8.8 mg/dL (ref 0.0–40.0)

## 2024-03-14 LAB — T4, FREE: Free T4: 0.86 ng/dL (ref 0.60–1.60)

## 2024-03-14 LAB — VITAMIN D 25 HYDROXY (VIT D DEFICIENCY, FRACTURES): VITD: 16.85 ng/mL — ABNORMAL LOW (ref 30.00–100.00)

## 2024-03-14 LAB — CBC
HCT: 47.1 % — ABNORMAL HIGH (ref 36.0–46.0)
Hemoglobin: 15.6 g/dL — ABNORMAL HIGH (ref 12.0–15.0)
MCHC: 33.1 g/dL (ref 30.0–36.0)
MCV: 89.2 fl (ref 78.0–100.0)
Platelets: 336 K/uL (ref 150.0–400.0)
RBC: 5.28 Mil/uL — ABNORMAL HIGH (ref 3.87–5.11)
RDW: 14.2 % (ref 11.5–15.5)
WBC: 8.6 K/uL (ref 4.0–10.5)

## 2024-03-14 LAB — TSH: TSH: 4.87 u[IU]/mL (ref 0.35–5.50)

## 2024-03-14 LAB — HEMOGLOBIN A1C: Hgb A1c MFr Bld: 5.5 % (ref 4.6–6.5)

## 2024-03-14 MED ORDER — AMPHETAMINE-DEXTROAMPHETAMINE 20 MG PO TABS: 20.0000 mg | ORAL_TABLET | Freq: Two times a day (BID) | ORAL | 0 refills | Status: AC

## 2024-03-14 MED ORDER — VENLAFAXINE HCL ER 150 MG PO CP24: 150.0000 mg | ORAL_CAPSULE | Freq: Every day | ORAL | 1 refills | Status: AC

## 2024-03-14 MED ORDER — BUSPIRONE HCL 10 MG PO TABS: 10.0000 mg | ORAL_TABLET | Freq: Two times a day (BID) | ORAL | 1 refills | Status: AC

## 2024-03-14 MED ORDER — MELOXICAM 15 MG PO TABS: 15.0000 mg | ORAL_TABLET | Freq: Every day | ORAL | 1 refills | Status: AC

## 2024-03-14 NOTE — Progress Notes (Signed)
 Patient ID: [name], female  DOB: Aug 15, 1975, 48 y.o.   MRN: [id number] Patient Care Team    Relationship Specialty Notifications Start End  Catherine Charlies LABOR, DO PCP - General Family Medicine  10/03/17   Ailene Georg BROCKS, MD Referring Physician Internal Medicine  10/04/17   Cammie Batters, OD Referring Physician Optometry  10/04/17   Jennett Darice SAUNDERS., MD Referring Physician Obstetrics and Gynecology  10/04/17   Laqueta Ozell BIRCH, MD Consulting Physician Anesthesiology  08/23/23    Comment: Orthopedic    Chief Complaint  Patient presents with   Annual Exam    Pt is fasting Mammogram bus-scheduled Chronic condition appointment Influenza vaccine-declined    Subjective: [name] is a 48 y.o.  Female  present for CPE and chronic Conditions/illness Management  All past medical history, surgical history, allergies, family history, immunizations, medications and social history were updated in the electronic medical record today. All recent labs, ED visits and hospitalizations within the last year were reviewed.  Health maintenance: Colonoscopy: Completed 04/10/2023-Dr. Nandigam-5-year repeat, colon polyps, diverticulosis and internal hemorrhoids present Mammogram: completed: 04/05/2023- mobile bus-OR> ordered for mobile bus October at OR  Cervical cancer screening: last pap: 02/2020,5 yr results:GYN-reminded patient due next year Immunizations: tdap UTD 2022, Influenza declined (encouraged yearly),covid x2 Infectious disease screening: HIV completed and Hep C completed  DEXA: routine screen Assistive device: None Oxygen use: None Patient has a Dental home. Hospitalizations/ED visits: Reviewed  Depression/ADD: Patient reports compliance w/ Effexor  150 mg daily and BuSpar  10 mg twice daily Her ADD is well-managed on adderall 20 mg BID.  She reports she has been on multiple different medications with either side effects or  failure with Cymbalta, Lexapro, Trintellex,  Brintellix, vybrid (nightmares), Zoloft (wt) and Paxil.   Indication for controlled substance: ADD Medication and dose: Adderall 15 mg twice daily # pills per month: 60/mo> refill 90 day at a time.  Last UDS date: collected today UDS next visit contract signed (Y/N): Y- Date narcotic database last reviewed (include red flags): Today  Hypothyroidism: Patient reports compliance with levothyroxine  100 mcg.      03/14/2024    8:48 AM 08/23/2023   11:05 AM 03/01/2023    2:35 PM 12/19/2022    2:32 PM 07/20/2022    9:59 AM  Depression screen PHQ 2/9  Decreased Interest 0 0 0 0 0  Down, Depressed, Hopeless 0 0 0 0 0  PHQ - 2 Score 0 0 0 0 0  Altered sleeping 1 0 1 0 0  Tired, decreased energy 1 1 0 0 0  Change in appetite 1 2 0 0 1  Feeling bad or failure about yourself  0 0 0 0 0  Trouble concentrating 0 1 0 0 1  Moving slowly or fidgety/restless 0 0 0 0 0  Suicidal thoughts 0 0 0 0 0  PHQ-9 Score 3 4 1  0 2  Difficult doing work/chores Not difficult at all Not difficult at all Not difficult at all  Not difficult at all      03/14/2024    8:48 AM 08/23/2023   11:05 AM 03/01/2023    2:35 PM 07/20/2022    9:59 AM  GAD 7 : Generalized Anxiety Score  Nervous, Anxious, on Edge 0 0 0 0  Control/stop worrying 2 0 0 0  Worry too much - different things 2 0 1 0  Trouble relaxing 2 0 0 0  Restless 0 0 0 0  Easily annoyed or irritable  2 0 0 0  Afraid - awful might happen 0 0 0 0  Total GAD 7 Score 8 0 1 0  Anxiety Difficulty Somewhat difficult Not difficult at all Not difficult at all       07/20/2022    9:58 AM 12/19/2022    2:32 PM 03/01/2023    2:34 PM 08/23/2023   11:05 AM 03/14/2024    8:47 AM  Fall Risk  Falls in the past year? 0 0 0 0 0  Was there an injury with Fall? 0 0 0  0  Fall Risk Category Calculator 0 0 0  0  Patient at Risk for Falls Due to No Fall Risks      Fall risk Follow up Falls evaluation completed Falls evaluation completed Falls evaluation completed Falls evaluation  completed Falls evaluation completed     Immunization History  Administered Date(s) Administered   DTaP 04/06/2011   Fluzone Influenza virus vaccine,trivalent (IIV3), split virus 04/05/2011   Influenza Inj Mdck Quad With Preservative 03/14/2014   Influenza, Quadrivalent, Recombinant, Inj, Pf 04/08/2017   Influenza, Seasonal, Injecte, Preservative Fre 03/13/2010, 04/05/2011   Influenza,inj,Quad PF,6+ Mos 06/02/2020   Influenza-Unspecified 04/05/2011, 03/14/2014, 03/18/2017   PFIZER(Purple Top)SARS-COV-2 Vaccination 07/24/2019, 08/21/2019, 08/29/2020   Tdap 04/05/2011, 08/19/2020     Past Medical History:  Diagnosis Date   ADD (attention deficit disorder)    Angular cheilitis 03/13/2018   Anxiety    Arthritis    Chicken pox    Chronic neck pain    HNP   Depression    Headache    Heart murmur    as a baby   Insomnia    Nocturia    Sleep apnea    CPAP-SLEEP STUDY DONE ABOUT 2 YRS AGO   Thyroid  disease    UTI (urinary tract infection)    Allergies  Allergen Reactions   Ativan [Lorazepam] Other (See Comments)    hyperactivity   Metformin  And Related    Penicillins Rash   Past Surgical History:  Procedure Laterality Date   ANTERIOR CERVICAL DECOMP/DISCECTOMY FUSION N/A 06/02/2014   Procedure: C6-7 Anterior Cervical Discectomy and Fusion, Allograft, Plate;  Surgeon: Oneil JAYSON Herald, MD;  Location: [facility] OR;  Service: Orthopedics;  Laterality: N/A;   CESAREAN SECTION  2004/2007   DILATION AND CURETTAGE OF UTERUS  2018   wisdom teeth extracted      Family History  Problem Relation Age of Onset   Arthritis Mother    Depression Mother    Drug abuse Mother    Hyperlipidemia Mother    Hypertension Mother    Alcohol abuse Father    Cancer - Cervical Sister    Colon cancer Maternal Grandmother    Breast cancer Maternal Grandmother    Thyroid  nodules Maternal Grandmother    Colon cancer Maternal Grandfather    Heart disease Paternal Grandmother    Alcohol abuse Paternal  Grandfather    Early death Son    Colon polyps Neg Hx    Esophageal cancer Neg Hx    Stomach cancer Neg Hx    Rectal cancer Neg Hx    Social History   Social History Narrative   Married.  2 children.   Some college.  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   Drinks caffeinated beverages.  Uses herbal remedies.   Smoke alarms in the home.  Wears her seatbelt.   Feels safe in her relationships.    Allergies as of 03/14/2024       Reactions  Ativan [lorazepam] Other (See Comments)   hyperactivity   Metformin  And Related    Penicillins Rash        Medication List        Accurate as of March 14, 2024  9:14 AM. If you have any questions, ask your nurse or doctor.          amphetamine -dextroamphetamine  20 MG tablet Commonly known as: ADDERALL Take 1 tablet (20 mg total) by mouth 2 (two) times daily. What changed: Another medication with the same name was changed. Make sure you understand how and when to take each. Changed by: Charlies Bellini   amphetamine -dextroamphetamine  20 MG tablet Commonly known as: ADDERALL Take 1 tablet (20 mg total) by mouth 2 (two) times daily. Start taking on: May 20, 2024 What changed: These instructions start on May 20, 2024. If you are unsure what to do until then, ask your doctor or other care provider. Changed by: Charlies Bellini   busPIRone  10 MG tablet Commonly known as: BUSPAR  Take 1 tablet (10 mg total) by mouth 2 (two) times daily.   levonorgestrel 20 MCG/24HR IUD Commonly known as: MIRENA 1 each by Intrauterine route once.   levothyroxine  100 MCG tablet Commonly known as: SYNTHROID  Take 1 tablet (100 mcg total) by mouth daily before breakfast.   meloxicam  15 MG tablet Commonly known as: MOBIC  Take 1 tablet (15 mg total) by mouth daily. With food   venlafaxine  XR 150 MG 24 hr capsule Commonly known as: Effexor  XR Take 1 capsule (150 mg total) by mouth daily with breakfast.        All past medical history, surgical history,  allergies, family history, immunizations andmedications were updated in the EMR today and reviewed under the history and medication portions of their EMR.     No results found for this or any previous visit (from the past 2160 hours).   No results found.   ROS 14 pt review of systems performed and negative (unless mentioned in an HPI)  Objective: BP 122/82   Pulse 73   Temp 98.3 F (36.8 C)   Ht 4' 10 (1.473 m)   Wt 219 lb 6.4 oz (99.5 kg)   SpO2 98%   BMI 45.85 kg/m  Physical Exam Vitals and nursing note reviewed.  Constitutional:      General: She is not in acute distress.    Appearance: Normal appearance. She is not ill-appearing, toxic-appearing or diaphoretic.  HENT:     Head: Normocephalic and atraumatic.  Eyes:     General: No scleral icterus.       Right eye: No discharge.        Left eye: No discharge.     Extraocular Movements: Extraocular movements intact.     Conjunctiva/sclera: Conjunctivae normal.     Pupils: Pupils are equal, round, and reactive to light.  Cardiovascular:     Rate and Rhythm: Normal rate and regular rhythm.  Pulmonary:     Effort: Pulmonary effort is normal. No respiratory distress.     Breath sounds: Normal breath sounds. No wheezing, rhonchi or rales.  Musculoskeletal:     Right lower leg: No edema.     Left lower leg: No edema.  Skin:    General: Skin is warm.     Findings: No rash.  Neurological:     Mental Status: She is alert and oriented to person, place, and time. Mental status is at baseline.     Motor: No weakness.  Gait: Gait normal.  Psychiatric:        Mood and Affect: Mood normal.        Behavior: Behavior normal.        Thought Content: Thought content normal.        Judgment: Judgment normal.      No results found.  Assessment/plan: [name] is a 48 y.o. female present for CPE and chronic condition management Depression, major, single episode, mild (HCC)/anxiety Stable Continue Effexor  150 mg  daily daily Continue buspar  10 mg bid   ADHD:  Stable Continue Adderall 20 mg twice daily (90d x2)- may take 1/2-1 tab by noon, N[facility]S database reviewed 03/14/24 Controlled substance contract in place  Hypothyroidism: Labs collected today Continue levothyroxine  100 mcg every day. 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 Salluo Continue Mobic  15 mg daily with food prescribed  BMI 45.0-49.9, adult (HCC)/E66.01 (HCC)/prediabetes/lipid screening - Hemoglobin A1c - Lipid panel - Vitamin D (25 hydroxy)  Influenza vaccine needed Declined  Breast cancer screening by mammogram -Scheduled for mammogram bus end of this month - MM 3D SCREENING MAMMOGRAM BILATERAL BREAST; Future   Routine general medical examination at a health care facility (Primary) Labs collected today Patient was encouraged to exercise greater than 150 minutes a week. Patient was encouraged to choose a diet filled with fresh fruits and vegetables, and lean meats. AVS provided to patient today for education/recommendation on gender specific health and safety maintenance. Colonoscopy: Completed 04/10/2023-Dr. Nandigam-5-year repeat, colon polyps, diverticulosis and internal hemorrhoids present Mammogram: completed: 04/05/2023- mobile bus-OR> ordered for mobile bus October at OR  Cervical cancer screening: last pap:9/ 2021,5 yr results:GYN-reminded patient due next year Immunizations: tdap UTD 2022, Influenza declined (encouraged yearly),covid x2 Infectious disease screening: HIV completed and Hep C completed  DEXA: routine screen  Return in about 24 weeks (around 08/29/2024) for Routine chronic condition follow-up.  Orders Placed This Encounter  Procedures   MM 3D SCREENING MAMMOGRAM BILATERAL BREAST   CBC   Hemoglobin A1c   Comprehensive  metabolic panel with GFR   Lipid panel   TSH   T4, free   Vitamin D (25 hydroxy)    Orders Placed This Encounter  Procedures   MM 3D SCREENING MAMMOGRAM BILATERAL BREAST   CBC   Hemoglobin A1c   Comprehensive metabolic panel with GFR   Lipid panel   TSH   T4, free   Vitamin D (25 hydroxy)   Meds ordered this encounter  Medications   amphetamine -dextroamphetamine  (ADDERALL) 20 MG tablet    Sig: Take 1 tablet (20 mg total) by mouth 2 (two) times daily.    Dispense:  180 tablet    Refill:  0   amphetamine -dextroamphetamine  (ADDERALL) 20 MG tablet    Sig: Take 1 tablet (20 mg total) by mouth 2 (two) times daily.    Dispense:  180 tablet    Refill:  0   busPIRone  (BUSPAR ) 10 MG tablet    Sig: Take 1 tablet (10 mg total) by mouth 2 (two) times daily.    Dispense:  180 tablet    Refill:  1   meloxicam  (MOBIC ) 15 MG tablet    Sig: Take 1 tablet (15 mg total) by mouth daily. With food    Dispense:  90 tablet    Refill:  1   venlafaxine  XR (  EFFEXOR  XR) 150 MG 24 hr capsule    Sig: Take 1 capsule (150 mg total) by mouth daily with breakfast.    Dispense:  90 capsule    Refill:  1   Referral Orders  No referral(s) requested today      Return in about 24 weeks (around 08/29/2024) for Routine chronic condition follow-up.   Electronically signed by: Charlies Bellini, DO [facility] [place]

## 2024-03-14 NOTE — Patient Instructions (Addendum)
 Return in about 24 weeks (around 08/29/2024) for Routine chronic condition follow-up.        Great to see you today.  I have refilled the medication(s) we provide.   If labs were collected or images ordered, we will inform you of  results once we have received them and reviewed. We will contact you either by echart message, or telephone call.  Please give ample time to the testing facility, and our office to run,  receive and review results. Please do not call inquiring of results, even if you can see them in your chart. We will contact you as soon as we are able. If it has been over 1 week since the test was completed, and you have not yet heard from us , then please call us .    - echart message- for normal results that have been seen by the patient already.   - telephone call: abnormal results or if patient has not viewed results in their echart.  If a referral to a specialist was entered for you, please call us  in 2 weeks if you have not heard from the specialist office to schedule.

## 2024-03-15 ENCOUNTER — Ambulatory Visit: Payer: Self-pay | Admitting: Family Medicine

## 2024-03-15 DIAGNOSIS — D582 Other hemoglobinopathies: Secondary | ICD-10-CM

## 2024-03-15 DIAGNOSIS — G4733 Obstructive sleep apnea (adult) (pediatric): Secondary | ICD-10-CM

## 2024-03-15 MED ORDER — LEVOTHYROXINE SODIUM 100 MCG PO TABS: 100.0000 ug | ORAL_TABLET | Freq: Every day | ORAL | 3 refills | Status: AC

## 2024-03-26 ENCOUNTER — Encounter: Payer: Self-pay | Admitting: Family Medicine

## 2024-03-27 MED ORDER — VITAMIN D (ERGOCALCIFEROL) 1.25 MG (50000 UNIT) PO CAPS: 50000.0000 [IU] | ORAL_CAPSULE | ORAL | 3 refills | Status: AC

## 2024-04-08 ENCOUNTER — Ambulatory Visit: Admission: RE | Admit: 2024-04-08 | Discharge: 2024-04-08 | Disposition: A | Payer: 59 | Source: Ambulatory Visit

## 2024-04-08 DIAGNOSIS — Z1231 Encounter for screening mammogram for malignant neoplasm of breast: Secondary | ICD-10-CM

## 2024-04-10 ENCOUNTER — Encounter: Admitting: Family Medicine

## 2024-08-29 ENCOUNTER — Ambulatory Visit: Admitting: Family Medicine
